# Patient Record
Sex: Male | Born: 1935 | Race: White | Hispanic: No | Marital: Married | Smoking: Former smoker
Health system: Southern US, Community
[De-identification: ages and names within clinical notes are randomized; demographics above are authoritative.]

## PROBLEM LIST (undated history)

## (undated) DIAGNOSIS — R1310 Dysphagia, unspecified: Secondary | ICD-10-CM

## (undated) DIAGNOSIS — E785 Hyperlipidemia, unspecified: Secondary | ICD-10-CM

## (undated) DIAGNOSIS — I10 Essential (primary) hypertension: Secondary | ICD-10-CM

## (undated) DIAGNOSIS — R600 Localized edema: Secondary | ICD-10-CM

## (undated) DIAGNOSIS — C61 Malignant neoplasm of prostate: Secondary | ICD-10-CM

## (undated) DIAGNOSIS — K219 Gastro-esophageal reflux disease without esophagitis: Secondary | ICD-10-CM

## (undated) DIAGNOSIS — K529 Noninfective gastroenteritis and colitis, unspecified: Secondary | ICD-10-CM

## (undated) DIAGNOSIS — J189 Pneumonia, unspecified organism: Secondary | ICD-10-CM

## (undated) DIAGNOSIS — J90 Pleural effusion, not elsewhere classified: Secondary | ICD-10-CM

## (undated) DIAGNOSIS — D649 Anemia, unspecified: Secondary | ICD-10-CM

## (undated) DIAGNOSIS — N289 Disorder of kidney and ureter, unspecified: Secondary | ICD-10-CM

## (undated) DIAGNOSIS — N189 Chronic kidney disease, unspecified: Secondary | ICD-10-CM

## (undated) DIAGNOSIS — R3129 Other microscopic hematuria: Secondary | ICD-10-CM

## (undated) DIAGNOSIS — Z789 Other specified health status: Secondary | ICD-10-CM

## (undated) DIAGNOSIS — R55 Syncope and collapse: Secondary | ICD-10-CM

## (undated) DIAGNOSIS — K449 Diaphragmatic hernia without obstruction or gangrene: Secondary | ICD-10-CM

## (undated) DIAGNOSIS — N529 Male erectile dysfunction, unspecified: Secondary | ICD-10-CM

## (undated) DIAGNOSIS — K8689 Other specified diseases of pancreas: Secondary | ICD-10-CM

## (undated) DIAGNOSIS — B029 Zoster without complications: Secondary | ICD-10-CM

## (undated) DIAGNOSIS — M810 Age-related osteoporosis without current pathological fracture: Secondary | ICD-10-CM

## (undated) DIAGNOSIS — B351 Tinea unguium: Secondary | ICD-10-CM

## (undated) DIAGNOSIS — K4090 Unilateral inguinal hernia, without obstruction or gangrene, not specified as recurrent: Secondary | ICD-10-CM

## (undated) HISTORY — DX: Diaphragmatic hernia without obstruction or gangrene: K44.9

## (undated) HISTORY — DX: Zoster without complications: B02.9

## (undated) HISTORY — DX: Syncope and collapse: R55

## (undated) HISTORY — DX: Other specified health status: Z78.9

## (undated) HISTORY — DX: Essential (primary) hypertension: I10

## (undated) HISTORY — DX: Unilateral inguinal hernia, without obstruction or gangrene, not specified as recurrent: K40.90

## (undated) HISTORY — DX: Pleural effusion, not elsewhere classified: J90

## (undated) HISTORY — DX: Dysphagia, unspecified: R13.10

## (undated) HISTORY — PX: HERNIA REPAIR: SHX51

## (undated) HISTORY — DX: Localized edema: R60.0

## (undated) HISTORY — DX: Anemia, unspecified: D64.9

## (undated) HISTORY — DX: Noninfective gastroenteritis and colitis, unspecified: K52.9

## (undated) HISTORY — DX: Chronic kidney disease, unspecified: N18.9

## (undated) HISTORY — DX: Other microscopic hematuria: R31.29

## (undated) HISTORY — PX: OTHER SURGICAL HISTORY: SHX169

## (undated) HISTORY — DX: Tinea unguium: B35.1

## (undated) HISTORY — DX: Hyperlipidemia, unspecified: E78.5

## (undated) HISTORY — DX: Male erectile dysfunction, unspecified: N52.9

## (undated) HISTORY — DX: Malignant neoplasm of prostate: C61

## (undated) HISTORY — DX: Age-related osteoporosis without current pathological fracture: M81.0

## (undated) HISTORY — DX: Disorder of kidney and ureter, unspecified: N28.9

---

## 1999-10-08 HISTORY — PX: TRANSPERINEAL IMPLANT OF RADIATION SEEDS W/ ULTRASOUND: SUR1381

## 2001-09-23 ENCOUNTER — Ambulatory Visit: Admission: RE | Admit: 2001-09-23 | Discharge: 2001-12-22 | Payer: Self-pay | Admitting: Radiation Oncology

## 2002-01-07 ENCOUNTER — Ambulatory Visit: Admission: RE | Admit: 2002-01-07 | Discharge: 2002-04-07 | Payer: Self-pay | Admitting: Radiation Oncology

## 2002-02-16 ENCOUNTER — Encounter: Payer: Self-pay | Admitting: Urology

## 2002-02-16 ENCOUNTER — Encounter: Admission: RE | Admit: 2002-02-16 | Discharge: 2002-02-16 | Payer: Self-pay | Admitting: Urology

## 2002-03-16 ENCOUNTER — Ambulatory Visit (HOSPITAL_BASED_OUTPATIENT_CLINIC_OR_DEPARTMENT_OTHER): Admission: RE | Admit: 2002-03-16 | Discharge: 2002-03-16 | Payer: Self-pay | Admitting: Urology

## 2003-09-26 ENCOUNTER — Ambulatory Visit: Admission: RE | Admit: 2003-09-26 | Discharge: 2003-09-26 | Payer: Self-pay | Admitting: Radiation Oncology

## 2004-02-15 ENCOUNTER — Ambulatory Visit (HOSPITAL_COMMUNITY): Admission: RE | Admit: 2004-02-15 | Discharge: 2004-02-15 | Payer: Self-pay | Admitting: *Deleted

## 2004-02-15 ENCOUNTER — Encounter (INDEPENDENT_AMBULATORY_CARE_PROVIDER_SITE_OTHER): Payer: Self-pay | Admitting: *Deleted

## 2004-02-17 ENCOUNTER — Encounter: Admission: RE | Admit: 2004-02-17 | Discharge: 2004-02-17 | Payer: Self-pay | Admitting: *Deleted

## 2004-08-16 ENCOUNTER — Encounter (INDEPENDENT_AMBULATORY_CARE_PROVIDER_SITE_OTHER): Payer: Self-pay | Admitting: *Deleted

## 2004-08-16 ENCOUNTER — Ambulatory Visit (HOSPITAL_COMMUNITY): Admission: RE | Admit: 2004-08-16 | Discharge: 2004-08-16 | Payer: Self-pay | Admitting: *Deleted

## 2004-09-24 ENCOUNTER — Ambulatory Visit: Admission: RE | Admit: 2004-09-24 | Discharge: 2004-09-24 | Payer: Self-pay | Admitting: Radiation Oncology

## 2005-06-05 ENCOUNTER — Encounter: Admission: RE | Admit: 2005-06-05 | Discharge: 2005-06-05 | Payer: Self-pay | Admitting: *Deleted

## 2005-09-13 ENCOUNTER — Encounter: Admission: RE | Admit: 2005-09-13 | Discharge: 2005-09-13 | Payer: Self-pay | Admitting: Gastroenterology

## 2006-10-21 ENCOUNTER — Encounter: Admission: RE | Admit: 2006-10-21 | Discharge: 2006-10-21 | Payer: Self-pay | Admitting: Cardiology

## 2006-10-24 ENCOUNTER — Ambulatory Visit (HOSPITAL_COMMUNITY): Admission: RE | Admit: 2006-10-24 | Discharge: 2006-10-24 | Payer: Self-pay | Admitting: Cardiology

## 2006-12-03 ENCOUNTER — Encounter: Admission: RE | Admit: 2006-12-03 | Discharge: 2006-12-03 | Payer: Self-pay | Admitting: Cardiology

## 2006-12-05 ENCOUNTER — Encounter (INDEPENDENT_AMBULATORY_CARE_PROVIDER_SITE_OTHER): Payer: Self-pay | Admitting: Specialist

## 2006-12-05 ENCOUNTER — Ambulatory Visit (HOSPITAL_COMMUNITY): Admission: RE | Admit: 2006-12-05 | Discharge: 2006-12-05 | Payer: Self-pay | Admitting: Cardiology

## 2006-12-19 ENCOUNTER — Ambulatory Visit (HOSPITAL_COMMUNITY): Admission: RE | Admit: 2006-12-19 | Discharge: 2006-12-19 | Payer: Self-pay | Admitting: Emergency Medicine

## 2006-12-19 ENCOUNTER — Ambulatory Visit: Payer: Self-pay | Admitting: Emergency Medicine

## 2006-12-23 ENCOUNTER — Ambulatory Visit: Payer: Self-pay | Admitting: Cardiology

## 2006-12-23 ENCOUNTER — Ambulatory Visit: Payer: Self-pay | Admitting: Emergency Medicine

## 2006-12-23 LAB — CONVERTED CEMR LAB
BUN: 17 mg/dL (ref 6–23)
CO2: 33 meq/L — ABNORMAL HIGH (ref 19–32)
Calcium: 9.6 mg/dL (ref 8.4–10.5)
Glucose, Bld: 106 mg/dL — ABNORMAL HIGH (ref 70–99)
Sodium: 138 meq/L (ref 135–145)
Total Protein: 6.7 g/dL (ref 6.0–8.3)

## 2007-01-02 ENCOUNTER — Ambulatory Visit: Payer: Self-pay | Admitting: Emergency Medicine

## 2007-01-30 ENCOUNTER — Ambulatory Visit: Payer: Self-pay | Admitting: Emergency Medicine

## 2007-01-30 LAB — CONVERTED CEMR LAB
BUN: 39 mg/dL — ABNORMAL HIGH (ref 6–23)
CO2: 32 meq/L (ref 19–32)
GFR calc Af Amer: 63 mL/min
Glucose, Bld: 102 mg/dL — ABNORMAL HIGH (ref 70–99)
Sodium: 143 meq/L (ref 135–145)

## 2007-02-04 ENCOUNTER — Ambulatory Visit: Payer: Self-pay

## 2007-02-11 ENCOUNTER — Ambulatory Visit: Payer: Self-pay | Admitting: Emergency Medicine

## 2007-06-23 DIAGNOSIS — C61 Malignant neoplasm of prostate: Secondary | ICD-10-CM | POA: Insufficient documentation

## 2007-06-23 DIAGNOSIS — Z85828 Personal history of other malignant neoplasm of skin: Secondary | ICD-10-CM

## 2007-06-23 DIAGNOSIS — J90 Pleural effusion, not elsewhere classified: Secondary | ICD-10-CM | POA: Insufficient documentation

## 2007-06-23 DIAGNOSIS — F3289 Other specified depressive episodes: Secondary | ICD-10-CM | POA: Insufficient documentation

## 2007-06-23 DIAGNOSIS — I10 Essential (primary) hypertension: Secondary | ICD-10-CM

## 2007-06-23 DIAGNOSIS — F329 Major depressive disorder, single episode, unspecified: Secondary | ICD-10-CM

## 2007-06-23 DIAGNOSIS — E785 Hyperlipidemia, unspecified: Secondary | ICD-10-CM

## 2007-06-23 DIAGNOSIS — M1990 Unspecified osteoarthritis, unspecified site: Secondary | ICD-10-CM | POA: Insufficient documentation

## 2007-09-11 ENCOUNTER — Encounter: Admission: RE | Admit: 2007-09-11 | Discharge: 2007-09-11 | Payer: Self-pay | Admitting: Gastroenterology

## 2009-04-14 ENCOUNTER — Ambulatory Visit (HOSPITAL_COMMUNITY): Admission: RE | Admit: 2009-04-14 | Discharge: 2009-04-14 | Payer: Self-pay | Admitting: Orthopedic Surgery

## 2009-05-01 ENCOUNTER — Ambulatory Visit (HOSPITAL_COMMUNITY): Admission: RE | Admit: 2009-05-01 | Discharge: 2009-05-01 | Payer: Self-pay | Admitting: Urology

## 2009-11-20 ENCOUNTER — Encounter (HOSPITAL_COMMUNITY): Admission: RE | Admit: 2009-11-20 | Discharge: 2010-01-15 | Payer: Self-pay | Admitting: Endocrinology

## 2010-03-05 ENCOUNTER — Emergency Department (HOSPITAL_COMMUNITY): Admission: EM | Admit: 2010-03-05 | Discharge: 2010-03-05 | Payer: Self-pay | Admitting: Family Medicine

## 2010-10-07 DIAGNOSIS — R55 Syncope and collapse: Secondary | ICD-10-CM

## 2010-10-07 HISTORY — DX: Syncope and collapse: R55

## 2010-10-28 ENCOUNTER — Encounter: Payer: Self-pay | Admitting: Cardiology

## 2011-02-03 ENCOUNTER — Emergency Department (HOSPITAL_COMMUNITY): Payer: Medicare Other

## 2011-02-03 ENCOUNTER — Inpatient Hospital Stay (HOSPITAL_COMMUNITY)
Admission: EM | Admit: 2011-02-03 | Discharge: 2011-02-06 | DRG: 312 | Disposition: A | Discharge status: Home / Self Care | Payer: Medicare Other | Attending: Family Medicine | Admitting: Family Medicine

## 2011-02-03 DIAGNOSIS — G8929 Other chronic pain: Secondary | ICD-10-CM | POA: Diagnosis present

## 2011-02-03 DIAGNOSIS — E871 Hypo-osmolality and hyponatremia: Secondary | ICD-10-CM | POA: Diagnosis present

## 2011-02-03 DIAGNOSIS — R1310 Dysphagia, unspecified: Secondary | ICD-10-CM | POA: Diagnosis present

## 2011-02-03 DIAGNOSIS — R112 Nausea with vomiting, unspecified: Secondary | ICD-10-CM | POA: Diagnosis present

## 2011-02-03 DIAGNOSIS — Y998 Other external cause status: Secondary | ICD-10-CM

## 2011-02-03 DIAGNOSIS — Z8546 Personal history of malignant neoplasm of prostate: Secondary | ICD-10-CM

## 2011-02-03 DIAGNOSIS — M810 Age-related osteoporosis without current pathological fracture: Secondary | ICD-10-CM | POA: Diagnosis present

## 2011-02-03 DIAGNOSIS — M6282 Rhabdomyolysis: Secondary | ICD-10-CM | POA: Diagnosis present

## 2011-02-03 DIAGNOSIS — M549 Dorsalgia, unspecified: Secondary | ICD-10-CM | POA: Diagnosis present

## 2011-02-03 DIAGNOSIS — R296 Repeated falls: Secondary | ICD-10-CM | POA: Diagnosis present

## 2011-02-03 DIAGNOSIS — M25519 Pain in unspecified shoulder: Secondary | ICD-10-CM | POA: Diagnosis present

## 2011-02-03 DIAGNOSIS — R55 Syncope and collapse: Principal | ICD-10-CM | POA: Diagnosis present

## 2011-02-03 LAB — BASIC METABOLIC PANEL
BUN: 18 mg/dL (ref 6–23)
CO2: 25 mEq/L (ref 19–32)
Calcium: 8.9 mg/dL (ref 8.4–10.5)
Chloride: 94 mEq/L — ABNORMAL LOW (ref 96–112)
Creatinine, Ser: 1.38 mg/dL (ref 0.4–1.5)
GFR calc non Af Amer: 50 mL/min — ABNORMAL LOW (ref >60)
Potassium: 3.6 mEq/L (ref 3.5–5.1)
Sodium: 128 mEq/L — ABNORMAL LOW (ref 135–145)

## 2011-02-03 LAB — POCT CARDIAC MARKERS
Myoglobin, poc: >500 ng/mL (ref 12–200)
Troponin i, poc: <0.05 ng/mL (ref 0.00–0.09)

## 2011-02-03 LAB — CBC
Hemoglobin: 11.3 g/dL — ABNORMAL LOW (ref 13.0–17.0)
MCH: 32.8 pg (ref 26.0–34.0)
MCHC: 35 g/dL (ref 30.0–36.0)

## 2011-02-03 LAB — DIFFERENTIAL
Eosinophils Relative: 1 % (ref 0–5)
Lymphocytes Relative: 22 % (ref 12–46)
Lymphs Abs: 1.8 10*3/uL (ref 0.7–4.0)
Monocytes Absolute: 0.5 10*3/uL (ref 0.1–1.0)
Monocytes Relative: 6 % (ref 3–12)
Neutro Abs: 6 10*3/uL (ref 1.7–7.7)

## 2011-02-03 LAB — CK TOTAL AND CKMB (NOT AT ARMC)
CK, MB: 18.5 ng/mL (ref 0.3–4.0)
Relative Index: 4.4 — ABNORMAL HIGH (ref 0.0–2.5)

## 2011-02-03 LAB — HEPATIC FUNCTION PANEL
ALT: 20 U/L (ref 0–53)
Albumin: 3.8 g/dL (ref 3.5–5.2)
Total Protein: 6.8 g/dL (ref 6.0–8.3)

## 2011-02-04 ENCOUNTER — Observation Stay (HOSPITAL_COMMUNITY): Payer: Medicare Other

## 2011-02-04 LAB — COMPREHENSIVE METABOLIC PANEL
ALT: 18 U/L (ref 0–53)
AST: 35 U/L (ref 0–37)
Alkaline Phosphatase: 51 U/L (ref 39–117)
CO2: 26 mEq/L (ref 19–32)
Calcium: 8.4 mg/dL (ref 8.4–10.5)
Chloride: 98 mEq/L (ref 96–112)
GFR calc Af Amer: 57 mL/min — ABNORMAL LOW (ref >60)
GFR calc non Af Amer: 47 mL/min — ABNORMAL LOW (ref >60)
Glucose, Bld: 115 mg/dL — ABNORMAL HIGH (ref 70–99)
Potassium: 3.9 mEq/L (ref 3.5–5.1)
Sodium: 132 mEq/L — ABNORMAL LOW (ref 135–145)
Total Bilirubin: 0.6 mg/dL (ref 0.3–1.2)

## 2011-02-04 LAB — CARDIAC PANEL(CRET KIN+CKTOT+MB+TROPI)
CK, MB: 8.5 ng/mL (ref 0.3–4.0)
Relative Index: 2.8 — ABNORMAL HIGH (ref 0.0–2.5)
Relative Index: 2.3 (ref 0.0–2.5)
Total CK: 368 U/L — ABNORMAL HIGH (ref 7–232)

## 2011-02-04 LAB — CBC
HCT: 29.4 % — ABNORMAL LOW (ref 39.0–52.0)
Hemoglobin: 10 g/dL — ABNORMAL LOW (ref 13.0–17.0)
MCV: 93.9 fL (ref 78.0–100.0)
WBC: 7.8 10*3/uL (ref 4.0–10.5)

## 2011-02-04 LAB — IRON AND TIBC
Iron: 42 ug/dL (ref 42–135)
Saturation Ratios: 15 % — ABNORMAL LOW (ref 20–55)
TIBC: 287 ug/dL (ref 215–435)

## 2011-02-04 LAB — SODIUM, URINE, RANDOM: Sodium, Ur: 30 mEq/L

## 2011-02-05 ENCOUNTER — Inpatient Hospital Stay (HOSPITAL_COMMUNITY): Payer: Medicare Other

## 2011-02-05 DIAGNOSIS — R55 Syncope and collapse: Secondary | ICD-10-CM

## 2011-02-05 LAB — CBC
HCT: 29.5 % — ABNORMAL LOW (ref 39.0–52.0)
Hemoglobin: 9.9 g/dL — ABNORMAL LOW (ref 13.0–17.0)
MCH: 32.2 pg (ref 26.0–34.0)
RBC: 3.07 MIL/uL — ABNORMAL LOW (ref 4.22–5.81)

## 2011-02-05 LAB — COMPREHENSIVE METABOLIC PANEL
Albumin: 3.2 g/dL — ABNORMAL LOW (ref 3.5–5.2)
BUN: 14 mg/dL (ref 6–23)
Calcium: 8.6 mg/dL (ref 8.4–10.5)
Chloride: 107 mEq/L (ref 96–112)
Creatinine, Ser: 1.42 mg/dL (ref 0.4–1.5)
GFR calc non Af Amer: 48 mL/min — ABNORMAL LOW (ref >60)
Total Bilirubin: 0.6 mg/dL (ref 0.3–1.2)

## 2011-02-05 LAB — D-DIMER, QUANTITATIVE: D-Dimer, Quant: 1.18 ug/mL-FEU — ABNORMAL HIGH (ref 0.00–0.48)

## 2011-02-05 MED ORDER — TECHNETIUM TO 99M ALBUMIN AGGREGATED
6.0000 | Freq: Once | INTRAVENOUS | Status: AC | PRN
  Administered 2011-02-05: 6 via INTRAVENOUS

## 2011-02-05 MED ORDER — XENON XE 133 GAS
10.0000 | GAS_FOR_INHALATION | Freq: Once | RESPIRATORY_TRACT | Status: AC | PRN
  Administered 2011-02-05: 10 via RESPIRATORY_TRACT

## 2011-02-06 ENCOUNTER — Inpatient Hospital Stay (HOSPITAL_COMMUNITY): Payer: Medicare Other

## 2011-02-06 LAB — CBC
HCT: 29.2 % — ABNORMAL LOW (ref 39.0–52.0)
Hemoglobin: 9.6 g/dL — ABNORMAL LOW (ref 13.0–17.0)
MCH: 31.7 pg (ref 26.0–34.0)
MCHC: 32.9 g/dL (ref 30.0–36.0)
MCV: 96.4 fL (ref 78.0–100.0)
Platelets: 141 10*3/uL — ABNORMAL LOW (ref 150–400)
RBC: 3.03 MIL/uL — ABNORMAL LOW (ref 4.22–5.81)
RDW: 12.8 % (ref 11.5–15.5)
WBC: 5.4 10*3/uL (ref 4.0–10.5)

## 2011-02-06 LAB — COMPREHENSIVE METABOLIC PANEL
Alkaline Phosphatase: 56 U/L (ref 39–117)
BUN: 14 mg/dL (ref 6–23)
Chloride: 107 mEq/L (ref 96–112)
GFR calc non Af Amer: 52 mL/min — ABNORMAL LOW (ref >60)
Glucose, Bld: 102 mg/dL — ABNORMAL HIGH (ref 70–99)
Potassium: 3.6 mEq/L (ref 3.5–5.1)
Total Bilirubin: 0.2 mg/dL — ABNORMAL LOW (ref 0.3–1.2)

## 2011-02-13 NOTE — Discharge Summary (Signed)
[name] [name], [name] [name]               ACCOUNT NO.:  [id number]  MEDICAL RECORD NO.:  [id number]           PATIENT TYPE:  I  LOCATION:  4704                         FACILITY:  [facility]  PHYSICIAN:  Calvert Cantor, M.D.     DATE OF BIRTH:  02/24/1929  DATE OF ADMISSION:  02/03/2011 DATE OF DISCHARGE:  02/06/2011                              DISCHARGE SUMMARY   PRIMARY CARE PHYSICIAN:  [name], MD  GASTROENTEROLOGIST:  [name], MD  CHIEF COMPLAINT:  Fall.  DISCHARGE DIAGNOSES: 1. Syncopal episode. 2. Hyponatremia. 3. Dysphagia to solids with resultant regurgitation and vomiting. 4. Left-sided neck pain due to trauma from syncopal episode and fall. 5. Mild rhabdomyolysis.  PAST MEDICAL HISTORY: 1. Hypertension. 2. Hyperlipidemia. 3. Osteoporosis. 4. Prostate cancer status post seed implantation. 5. Chronic pain.  DISCHARGE MEDICATIONS:  I have not made any changes to his medications. The patient can continue on the following. 1. Altace 5 mg daily. 2. Calcium carbonate 600 mg/vitamin D 1 tablet twice a day. 3. Crestor 10 mg daily. 4. HCTZ 25 mg daily. 5. Miacalcin 1 spray in alternate nostrils daily. 6. Multivitamin 1 tablet daily. 7. Oxycodone 30 mg every 8 hours as needed for pain. 8. Vesicare 10 mg daily at bedtime. 9. Zoloft 50 mg tab, take to the morning, 1 in the evening.  IMAGING: 1. CT C-spine without contrast revealed cervical spondylosis without     acute injury. 2. CT head without contrast, no acute findings. 3. Chest x-ray, stable appearance with left basilar scarring and old     right rib fractures. 4. Left shoulder x-ray, no acute findings, mild acromioclavicular     osteoarthritis. 5. Ribs, no acute finding, remote right rib fracture. 6. Scapula x-ray, no acute finding. 7. MRI of the brain without contrast, atrophy and mild chronic     microvascular ischemic, no acute infarct or mass, chronic     sinusitis. 8. Ultrasound of the abdomen  complete, which was ordered for his     vomiting revealed no evidence of gallstones or acute cholecystitis.     There was possible mild hepatic steatosis and increased renal     echogenicity suggesting medical renal disease. 9. V/Q scan revealed attenuation from overlying left arm on the left     lateral image.  No segmental or lobar ventilation or perfusion     defects seen.  No ventilation perfusion mismatch.  Exam is low     probability for PE. 10.Barium swallow for complaint of dysphagia revealed small sliding     hiatal hernia without demonstration of reflux or stricture.  There     was marked dysmotility with tertiary contractions. 11.A 2-D echo revealed EF of 55-60%.  There was grade 1 diastolic     dysfunction, mild diffuse thickening, and calcification of the     aortic valve with trivial regurgitation.  Left atrium was mildly     dilated. 12.Carotid Doppler preliminary study was negative.  LABORATORY DATA: 1. Pertinent blood work; sodium on admission 128, improved to 140 on     discharge with IV fluids and holding HCTZ. 2. Hemoglobin 9.6,  hematocrit 29.2 on discharge, platelet count 141. 3. Iron levels revealed UIBC, which was slightly low at 15, range     being 20-55.  Iron level was at the low side at 42, range being 42-     135.  Total iron binding capacity 287, ferritin 139.  Vitamin B12     and folic acid levels were within normal limits. 4. Urine osmolality checked on admission 211, urine sodium 30. 5. Mild rhabdomyolysis with total CK in the 300-400.  HOSPITAL COURSE:  This is an 77 year old male, who came in after a fall. The patient fell backwards and hit his head on the floor.  He did lose consciousness, therefore he was admitted with a diagnosis of syncope. He apparently had some jerky movements, but did not have tongue biting or incontinence of urine or stool.  The patient admitted to having multiple episodes of vomiting the prior day.  After waking up from  his spell of syncope, he noticed severe pain in his neck and shoulder.  The patient was admitted to the telemetry floor and above-mentioned workup was done for his syncope.  No obvious cause was found.  He was noted to be slightly hyponatremic.  I am unsure of his syncope was orthostatic secondary to dehydration, but on admission his HCTZ was held and he was hydrated with normal saline.  No abnormalities were noted on telemetry monitor.  The patient was noted to have ecchymosis on the left side of his neck.  He does complain of continued stiffness of his neck and shoulder secondary to trauma from the fall.  Pain is not severe.  The patient presented with a complaint of vomiting the day prior to admission.  Upon obtaining further details, the patient expressed that he was feeling like his food was getting stuck in his chest.  This would result in gagging and then subsequently vomiting.  Therefore, a barium swallow was ordered and the above-mentioned results were noted to esophageal dysmotility.  The patient's gastroenterologist is [name], however, he was in procedures today and I was unable to speak with him.  I did speak with [name], who advised that there is no specific treatment for this problem.  Essentially, the patient needs to take small bites and chew his food carefully and thoroughly to help prevent it from lodging in the esophagus.  If the patient has further questions or further problems, he is advised that he can see [name] in the office.  The patient and family are in agreement with this plan. The patient has been ambulating in the halls and does not complain of any dizziness or other symptoms of near syncope.  PHYSICAL EXAM:  LUNGS:  Clear bilaterally. HEART:  Regular rate and rhythm.  No murmurs. ABDOMEN:  Soft, nontender, nondistended.  Bowel sounds positive.  No organomegaly. EXTREMITIES:  No cyanosis, clubbing, or edema.  CONDITION ON DISCHARGE:   Stable.  DISCHARGE INSTRUCTIONS: 1. Follow up with [name] in 1-2 weeks.  I am uncertain of     whether his sodium of 128 is something that was chronic.  Also I am     not completely certain if this hyponatremia or dehydration was the     cause of his syncope, but he is advised to have his sodium checked     in a couple of weeks. 2. Follow up with [name] if needed.  Time on discharge was 50 minutes.     Calvert Cantor, M.D.  SR/MEDQ  D:  02/06/2011  T:  02/06/2011  Job:  [id number]  cc:   [name], M.D. [name], M.D.  Electronically Signed by Calvert Cantor M.D. on 02/13/2011 11:18:35 PM

## 2011-02-17 NOTE — H&P (Signed)
[name] [name], [name] [name]               ACCOUNT NO.:  [id number]  MEDICAL RECORD NO.:  [id number]           PATIENT TYPE:  E  LOCATION:  [facility]                         FACILITY:  [facility]  PHYSICIAN:  Eduard Clos, MDDATE OF BIRTH:  04-22-1929  DATE OF ADMISSION:  02/03/2011 DATE OF DISCHARGE:                             HISTORY & PHYSICAL   CHIEF COMPLAINT:  Fall.  HISTORY OF PRESENT ILLNESS:  This is an 77 year old male with known history of hypertension, osteoporosis, hyperlipidemia, CA prostate status post seed placement, was doing fine and while talking to his wife in the kitchen around noontime suddenly, he fell backwards and hit on the floor.  He has lost the consciousness for a few minutes.  At that time, he also had some jerking movements.  He did not have any tongue biting, incontinent of urine.  He woke up spontaneously back and was brought to the ER.  He did not have any chest pain or shortness of breath.  Denies any cough or phlegm.  Denies any fever or chills.  He has no abdominal pain, dysuria, discharges or diarrhea.  The patient prior after the fall did not have any focal deficit, headache or visual symptoms.  The patient does complain of some neck pain and left shoulder pain.  The pain is increased with movement of his shoulder.  There is also tenderness on the left upper back area.  The patient said he has been having multiple episodes of nausea and vomiting last night and he was feeling weak after that.  Today morning was feeling fine.  He did not have any nausea or vomiting.  At this time, the patient had a CT head, neck, EKGs, cardiac enzyme all within negative.  The patient will be admitted for further workup.  PAST MEDICAL HISTORY:  Hypertension, hyperlipidemia, osteoporosis, history of CA prostate, chronic pain.  PAST SURGICAL HISTORY:  Inguinal hernia surgery and has had seed placement for prostate cancer.  MEDICATIONS PRIOR TO ADMISSION: 1. Miacalcin  200 units daily. 2. Zoloft 50 mg 3 tablets daily. 3. Crestor 10 mg daily. 4. Altace 5 mg daily. 5. Hydrochlorothiazide 25 mg daily. 6. Caltrate 600 mg twice daily. 7. Centrum Silver one daily. 8. Fish oil 1200 mg daily. 9. Magnesium 400 mg twice daily.  ALLERGIES.:  PENICILLIN.  SOCIAL HISTORY:  The patient does not smoke cigarette, drink alcohol, or use any illicit drugs.  Lives with his wife.  He is a full Code.  FAMILY HISTORY:  Nothing contributory.  REVIEW OF SYSTEMS:  As per history of present illness, nothing else significant.  PHYSICAL EXAMINATION:  GENERAL:  The patient examined at bedside, not in acute distress. VITAL SIGNS:  Blood pressure 113/60, pulse 85 per minute, temperature 98, respirations 18, O2 sat 95%.  HEENT: Anicteric.  No pallor.  No facial asymmetry.  PERRLA positive.  No discharge from ears, eyes or mouth.  There is mild tenderness in the left shoulder area noted in the neck.  No neck rigidity. CHEST:  Bilateral entry present.  No rhonchi. No crepitation. HEART:  S1, S2 heard. ABDOMEN: Soft, nontender.  Bowel sounds heard.  CNS:  The patient is alert, awake, oriented to time, place, and person. Moves upper and lower extremities 5/5.  There is no pronator drift. There is no dysdiadochokinesia.  There is no ataxia. EXTREMITIES:  Peripheral pulses felt.  As I said earlier, the patient has some pain in the left shoulder when he abducts beyond 45 degrees.  I do not see any obvious hematoma or bruises, peripheral pulses felt.  No edema.  No cyanotic changes or clubbing.  LABORATORY DATA:  EKG shows normal sinus rhythm with some nonspecific ST- T changes.  Heart rate is around 82 beats per minute.  CT head without contrast shows no acute findings.  CT cervical spine, cervical spondylosis without acute injury.  CBC:  WBC 8.5, hemoglobin is 11.3, hematocrit is 32.3, platelets 169.  Basic metabolic panel:  Sodium 128, potassium 3.6, chloride 94, carbon  dioxide 25, glucose 105, BUN 18, creatinine 1.3, calcium 8.9.  CK-MB is 12.2, troponin less than 0.05, myoglobin is more than 500.  ASSESSMENT: 1. Syncope. 2. Nausea and vomiting. 3. Mild hyponatremia. 4. Left shoulder and back pain increased on inspiration. 5. History of hypertension. 6. History of hyperlipidemia. 7. History of carcinoma of the prostate. 8. History of osteoporosis. 9. History of anemia.  PLAN: 1. At this time, we will admit the patient to telemetry to rule out     any arrhythmia. 2. For syncope at this time, we could not check orthostatic.  The     patient is mildly hyponatremic could be from his nausea, vomiting     yesterday and added on with hydrochlorothiazide.  We will gently     hydrate the patient at this time.  We will hold off his     hydrochlorothiazide at this time also, I am going to hold off his     Crestor as the myoglobin is high, we need to make sure there is no     rhabdomyolysis forming, for which we are going to check CK.  We are     going to cycle the cardiac markers.  We will check 2-D echo and MRI     of the brain. 3. Left shoulder pain and upper back pain, which is pleuritic and also     increased on the left shoulder abduction.  At this time, we are     going to get an x-ray of the left shoulder, scapula and also going     to do chest x-ray stat, the patient will continue with medication. 4. The patient does have anemia.  We will check anemia profile.     Recheck CBC in morning. 5. Hypertension.  We will continue Altace but we will hold of his     hydrochlorothiazide as he has hyponatremia.  We will check urine     sodium. 6. Further tests based on the test order and clinic course.     Eduard Clos, MD     ANK/MEDQ  D:  02/03/2011  T:  02/03/2011  Job:  [id number]  Electronically Signed by Midge Minium MD on 02/17/2011 08:28:31 AM

## 2011-02-22 NOTE — Op Note (Signed)
[facility]  Patient:    [name] [name], [name] [name] Visit Number: [id number] MRN: [id number]          Service Type: NES Location: [facility] Attending Physician:  Monica Becton Dictated by:   Claudette Laws, M.D. Proc. Date: 03/16/02 Admit Date:  03/16/2002                             Operative Report  PREOPERATIVE DIAGNOSIS:  Carcinoma of the prostate gland.  POSTOPERATIVE DIAGNOSIS:  Carcinoma of the prostate gland.  OPERATION:  Transperineal radioactive seed implantation and cystoscopy.  SURGEON:  Claudette Laws, M.D.  DESCRIPTION OF PROCEDURE:  The patient was prepped and draped in the dorsolithotomy position under intubated general anesthesia.  A transrectal probe was placed and imaging studies were recreated on the screen according to the preplanning study.  This patient did receive 4500 cGy ERT preoperative. Reference point was two point centimeters.  A total of 20 needles were placed according to the planning study.  At the end of the procedure, there appeared to be one pellet in the bladder.  Cystoscopy confirmed a portion of the strand in the bladder at about the three oclock position.  We were able to extract the strand.  It broke off along with the radioactive seed.  However, no other seeds were noted.  We then did a fluoroscopy post seed implant and there appeared to be two seeds in the bladder.  However, cystoscopy did not reveal any obvious seeds, so we put a Foley catheter and irrigated it out until clear.  The urine was slightly bloody.  We anticipate now that the seeds are in the bladder and they will migrate out themselves and he will void them spontaneously.  The patient was then taken back to the recovery room in satisfactory condition after placing a 20-French 10 cc Foley catheter.  We used palladium seeds strand. Dictated by:   Claudette Laws, M.D. Attending Physician:  Monica Becton DD:  03/16/02 TD:  03/17/02 Job:  [id number]

## 2011-02-22 NOTE — Assessment & Plan Note (Signed)
[facility]                             PULMONARY OFFICE NOTE   NAME:[name] [name], [name] [name]                      MRN:          [id number]  DATE:02/11/2007                            DOB:          May 12, 1929    SUBJECTIVE:  [name] [name] is a 77 year old gentleman who returns today for  a follow up visit. I have seen him for a left sided exudative effusion  that was believed to be due to a viral pleurisy. At our last visit, I  was concerned that [name] [name] was experiencing some worsening lower  extremity edema. He had been on ibuprofen for his pleuritic pain and I  was concerned that this, coupled with his ACE inhibitor use, might have  caused some renal insufficiency. His renal panel has been performed and  it is detailed below. He tells me that his breathing is doing well. He  is not having any chest discomfort. His chest x-ray from 01/30/2007 shows  that his left sided effusion is smaller.   CURRENT MEDICATIONS:  This were reviewed and are correct on his clinic  chart.   PHYSICAL EXAMINATION:  GENERAL:  This is a pleasant elderly man who  interacts appropriately and is in no distress.  VITAL SIGNS:  Weight 171 pounds, temperature 98.4, blood pressure  150/80, heart rate 72, SPO2 97% on room air.  HEENT:  Benign.  NECK:  Without strider.  LUNGS:  Clear on the right. He does have some slightly decreased breath  sounds at his left base, but the air movement is much improved compared  with his prior exams. I do not hear any wheezing.  HEART:  Regular rate and rhythm without murmur.  ABDOMEN:  Benign.  EXTREMITIES:  No cyanosis or clubbing. He does have some mild ankle  edema. A Doppler ultrasound of his bilateral lower extremities is  negative for DVT.   LABORATORY DATA:  His chemistry panel shows a sodium of 143, potassium  5.0, chloride 106, CO2 32, BUN 39, creatinine 1.4, glucose 102.   IMPRESSION:  1. Pleurisy with left sided effusion that is now  resolved without any      evidence of dyspnea.  2. Lower extremity edema without evidence for DVT or new renal      insufficiency.   PLANS:  1. I do not believe that there is any need for any further evaluation      of [name] [name]' effusion unless he begins to evolve symptoms      including chest discomfort or shortness of breath, etcetera.  2. I will defer to [name] and to Dr. Donnie Aho regarding Mr.      [name]' lower extremity edema. I have asked him to discontinue his      ibuprofen, since his pleuritic chest discomfort has      improved.  3. [name] [name] can see me on as needed basis.     Leslye Peer, MD  Electronically Signed    RSB/MedQ  DD: 02/22/2007  DT: 02/23/2007  Job #: [id number]   cc:   [name], M.D.  Lacretia Nicks.  Viann Fish, M.D.

## 2011-02-22 NOTE — Cardiovascular Report (Signed)
[name] [name], [name] [name]               ACCOUNT NO.:  [id number]   MEDICAL RECORD NO.:  [id number]          PATIENT TYPE:  OIB   LOCATION:  2899                         FACILITY:  [facility]   PHYSICIAN:  Georga Hacking, M.D.DATE OF BIRTH:  14-Sep-1929   DATE OF PROCEDURE:  10/24/2006  DATE OF DISCHARGE:                            CARDIAC CATHETERIZATION   HISTORY:  A 77 year old male with risk factors of family history,  hypertension, and hyperlipidemia who has had anterior chest discomfort  throughout the week.  He was initially evaluated with a chest CT and had  negative cardiac enzymes.  He has continued to have chest discomfort at  rest and is brought into the Catheterization Laboratory.  He has had new  inferior T-wave changes noted.   PROCEDURE:  Left heart catheterization with coronary angiograms and left  ventriculogram.   COMMENTS ABOUT PROCEDURE:  The patient tolerated the procedure well  without complications.  Following the procedure, he had good hemostasis  and peripheral pulses noted.  The procedure was done through the right  femoral artery with a single anterior needle wall stick with a 6-French  catheter-based system.   HEMODYNAMIC DATA:  Aorta post contrast 107/50.  LV post contrast 107/3-  14.   ANGIOGRAPHIC DATA:  Left ventriculogram:  Performed in the 30-degree RAO  projection.  Aortic valve is normal.  The mitral valve is normal.  The  left ventricle appears normal in size.  Estimated ejection fraction is  60%.  Coronary arteries arise and distribute normally.  No coronary  calcification is present.  The left main coronary artery is normal.  The  left anterior descending appears to be intramyocardial in its proximal  portion.  It contains no significant disease.  The circumflex coronary  artery has two marginal branches that is moderately tortuous and  contains no significant disease.  The right coronary artery has  significant tortuosity; this supplies a  posterior descending and a  posterolateral branch.  There are a set of two smaller posterolateral  branches that the distal vessel terminates in.   IMPRESSION:  1. Normal left ventricular function.  2. No significant coronary disease identified.   RECOMMENDATIONS:  Look for other sources of chest pain.  Continue  treatment of other risk factors.      Georga Hacking, M.D.  Electronically Signed     WST/MEDQ  D:  10/24/2006  T:  10/24/2006  Job:  [id number]   cc:   [name], M.D.

## 2011-02-22 NOTE — Assessment & Plan Note (Signed)
[facility]                             PULMONARY OFFICE NOTE   NAME:COMBSErman, Thum                      MRN:          [id number]  DATE:12/19/2006                            DOB:          11/16/1928    REASON FOR CONSULTATION:  We were asked by Dr. Viann Fish to  evaluate Mr. Klas for a left pleural effusion.   BRIEF HISTORY:  Mr. Marchena is a 77 year old man with a history of  prostate cancer and hypertension who was diagnosed with herpes zoster in  early February of this year around his left flank and costal margin.  He  states that he also fell and hurt his right side around the same time.  He went to be evaluated after that trauma, and his chest x-ray showed  left-sided pleural fluid.  Part of his evaluation has also included  referral to Dr. Donnie Aho to rule out occult coronary disease given his  chest discomfort at the time of his eruption of shingles, and some  nonspecific EKG abnormalities.  He had a cardiac catheterization that  did not show any significant coronary artery disease, and he did not  have any interventions made.  After his chest x-ray revealed left  pleural fluid, a thoracentesis was performed on December 05, 2006.  He  tells me that his breathing has been okay since then, but he has had a  decreased activity level over this time period.  He is referred now for  further evaluation of his left-sided effusion.   PAST MEDICAL HISTORY:  1. Hypertension.  2. Prostate cancer.  3. Skin cancer.  4. Osteoarthritis.  5. Hyperlipidemia.  6. Depression.   ALLERGIES:  PENICILLIN.   MEDICATIONS:  1. Fosamax 70 mg weekly.  2. Miacalcin 200 international units daily.  3. Zoloft 150 mg daily.  4. Crestor 10 mg daily.  5. Altace 5 mg daily.  6. Hydrochlorothiazide 25 mg daily.  7. Oxycodone 40 mg b.i.d.  8. Caltrate plus vitamin D 1200 mg daily.  9. Centrum Silver multivitamin once daily.  10.Fish oil 1200 mg daily.  11.Hydrocodone 10/500 one q. 4 hours p.r.n.   SOCIAL HISTORY:  Patient is married.  He has a remote tobacco history  with a total of 6-pack years.  He quit in 1954.  He occasionally drinks  wine.  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  He lives with his wife.   FAMILY HISTORY:  Significant for coronary artery disease in his brother,  lung cancer and prostate cancer in his brothers, throat cancer in his  mother.   REVIEW OF SYSTEMS:  Significant for exertional dyspnea, chest  discomfort, some symptoms of reflux and indigestion, and some difficulty  with swallowing.   EXAMINATION:  GENERAL:  This is a thin, elderly, well-appearing  gentleman, who is in no distress.  His weight is 169 pounds.  Temperature 98.5.  Blood pressure 114/62.  Heart rate 78.  SP02 94% on room air.  HEENT:  He has hearing aids bilaterally.  His oropharynx is clear.  NECK:  His neck is supple without lymphadenopathy or stridor.  LUNGS:  His lungs have decreased breath sounds at his left base.  There  is no wheezing or crackles heard.  HEART:  Has a regular rate and rhythm without murmur.  ABDOMEN:  Soft and benign.  EXTREMITIES:  Have no cyanosis, clubbing or edema.  NEUROLOGIC:  He has a nonfocal exam.  A CT scan of his chest performed on December 03, 2006 showed an  enlarging left pleural effusion that occupied approximately 50% of the  left hemithorax.  There was no definite associated pleural nodularity or  pleural enhancement on this contrasted study.  He had an old T6  compression fracture and an old right 10th rib fracture.  Otherwise, no  parenchymal abnormalities were noted.  A thoracentesis performed on  December 05, 2006 revealed pleural fluid that was sent for cytology.  This was consistent with flora reactive mesothelial cells on the  background of inflammation, but there were no malignant cells noted.  His total protein was 4.8.  His LDH was 120.  Serum studies were not  performed.  Cell count  differential showed total white blood cell count  of 600, twenty five percent PMNs, 26% lymphocytes, and 43% monocytes,  and 6% eosinophils.  Bacterial culture on the fluid was negative.  Followup chest x-ray on December 05, 2006 showed a decrease in the size  of his left-sided pleural effusion post thoracentesis, and no evidence  of pneumothorax.  Chest x-ray in the office today shows significant  reaccumulation of his left-sided pleural effusion that appears to layer  without evidence of loculation.   IMPRESSION:  Left pleural effusion of unclear etiology.  His pathology  and micro studies are negative to date.  His LDH and total protein could  be consistent with an exudate of monocyte predominance, but serum  studies were not performed.  I question whether this may be related to  his trauma versus possibly his herpes zoster, although I doubt the  latter.  His Cardiology evaluation has been reassuring.   PLAN:  I would like to resample his pleural fluid at this time, and send  a complete set of labs to help narrow our differential diagnosis.  In  particular, we need to know if this is truly an exudate or possibly  transudate.  He will follow up with me to review the results.   PROCEDURE:  Left thoracentesis was attempted under sterile conditions  after informed consent was obtained from Mr. Winquist.  Two passes were  made without any fluid obtained.  For this reason, the procedure was  aborted, and the decision was made to proceed with an ultrasound-guided  thoracentesis to allow adequate drainage.  A post-procedural chest x-ray  showed no evidence of pneumothorax after the procedure.     Leslye Peer, MD  Electronically Signed    RSB/MedQ  DD: 12/23/2006  DT: 12/23/2006  Job #: [id number]   cc:   Georga Hacking, M.D.

## 2011-02-22 NOTE — Assessment & Plan Note (Signed)
[facility]                             PULMONARY OFFICE NOTE   NAME:Cropper, HARITH MCCADDEN                      MRN:          [id number]  DATE:12/23/2006                            DOB:          25-Sep-1929    SUBJECTIVE:  Mr. Lakeman is a 77 year old man who returns today after his  ultrasound-guided thoracentesis to discuss the possible etiology of his  left pleural effusion.  He tells me today that his breathing is about  the same as it has been, it is no better, no worse.  He is not having  any cough or sputum production.  He denies any fevers or chills, but at  least once this week he has had a night sweat that soaked sheets.  He is  not having any significant chest pain.  His back is sore at the site of  his thoracentesis.   CURRENT MEDICATIONS:  Were reviewed and were the same as his visit on  December 19, 2006.   EXAM:  GENERAL:  This is a pleasant, thin, well-appearing, elderly  gentleman who is in no distress.  His weight is 169 pounds, temperature  98.1, blood pressure 112/64, heart rate 78, SPO2 96% on room air.  HEENT:  Benign.  NECK:  Supple without stridor or lymphadenopathy.  LUNGS:  Have improved aeration at his left base with no significant  crackles or wheezing, his right side is clear.  HEART:  Regular rate and rhythm without murmur.  ABDOMEN:  Soft, nontender, nondistended, positive bowel sounds.  EXTREMITIES:  No cyanosis, clubbing or edema.  NEUROLOGIC:  Nonfocal exam.   STUDIES:  Pleural fluid is available for evaluation.  His total protein  was 4.4, glucose 115, triglycerides 29, amylase 26, LDH 125.  The pH was  7.  Cell count differential showed 660 total white blood cells with 53%  neutrophils, 26% lymphocytes and 17% monocytes.  There were 4%  eosinophils.  Microbiology showed no organisms on Gram stain with final  culture still pending.  The fluid was negative for AFB on smear, and  culture is still pending.  Yeast was  negative on smear with culture  pending.   IMPRESSION:  Left pleural effusion that appears to be exudative in  nature without clear etiology.  He is currently clinically stable and  doing well.  His only constitutional symptom is one episode of night  sweats.   PLANS:  I would like to perform a CT scan of the chest with contrast to  better characterize Mr. Staib' underlying left lower lobe parenchyma now  that his pleural fluid has been removed.  I feel that this will allow [place]  to rule out any possible hidden mass that may explain his exudative  effusion.  It is quite possible that this fusion is related to  inflammation either from recent infection, his trauma or possibly even  the shingles, although this would be quite unusual.  I will follow up  with Mr. Chasen after his CT scan of the chest is performed to review  results and to  plan any necessary therapy.  Will also need to follow him clinically  with chest x-rays to ensure that his pleural fluid is not  reaccumulating.     Leslye Peer, MD  Electronically Signed    RSB/MedQ  DD: 12/23/2006  DT: 12/23/2006  Job #: [id number]   cc:   Georga Hacking, M.D.

## 2011-02-22 NOTE — Assessment & Plan Note (Signed)
[facility]                             PULMONARY OFFICE NOTE   NAME:[name], [name] COLAIZZI                      MRN:          [id number]  DATE:01/30/2007                            DOB:          Jun 14, 1929    SUBJECTIVE:  [name] [name] is a 77 year old man, who follows up today for  his left-sided pleural effusion.  The pleural fluid was exudative and  culture negative.  Followup CT scanning has not identified any  underlying mass or other clear cause for his exudative effusion.  He  returns today, telling me that he believes his breathing has improved  since our last visit.  He is also having no more chest discomfort, but  he has been taking ibuprofen 600 mg q. 6 hours around the clock.  Our  initial discussion had been for him to take this medication on an as-  needed basis.  Also since our last visit, he has noticed some left lower  extremity edema.  This is painless.  He noticed it about two weeks ago.  His right side is, for the most part, unaffected.   MEDICATIONS:  1. Fosamax 70 mg weekly.  2. Miacalcin 200 international units daily.  3. Zoloft 150 mg daily.  4. Crestor 10 mg daily.  5. Altace 5 mg daily.  6. HCTZ 25 mg daily.  7. Oxycodone 40 mg b.i.d.  8. Caltrate plus vitamin D 600 mg two daily.  9. Centrum Silver multivitamin once daily.  10.Fish oil 1200 mg daily.  11.Hydrocodone 10/500 q. 4 hours p.r.n.  12.Ibuprofen 600 mg q. 6 hours.   EXAM:  IN GENERAL:  This is an elderly, pleasant man, who is in no  distress.  His weight is 173 pounds, temperature 98.8, blood pressure  124/70, heart rate 79, spO2 93% on room air.  HEENT EXAM:  Benign.  LUNGS:  Clear on the right.  He has better air movement on the left than  on our previous exam, but he does have a few basilar crackles with some  slightly decreased breath sounds at his left base.  HEART:  Regular rate and rhythm without murmur.  ABDOMEN:  Benign.  EXTREMITIES:  Significant for 2+  pitting left lower extremity edema.  There is trace right-sided edema.  VITAL SIGNS:  Weight 173 pounds, up from 167 pounds at our last visit.  Temperature 98.8, blood pressure 124/70, heart rate 79, spO2 93% on room  air.   IMPRESSION:  1. History of left exudative effusion.  I suspect that this is due to      a viral pleurisy.  His effusion appears to be resolving, at least      by physical exam.  2. Lower extremity edema and weight-gain.  I am concerned that Mr.      [name] was using his ibuprofen more frequently than originally      intended.  This, coupled with his ACE inhibitor use, may have led      to some renal insufficiency.  I will evaluate as below.  We must      also consider  possible DVT.   PLANS:  1. Basic metabolic panel now.  2. I have asked him to stop taking his ibuprofen on a standing basis.      He will use his other pain medications on an as-needed basis.  3. Left lower extremity Doppler ultrasound to rule out DVT.  4. Chest x-ray today to insure there has been no reaccumulation of his      effusion.  5. Followup with [name] [name] at my next available appointment to discuss      the results of these studies.  I will call him if his renal      function is abnormal.     Leslye Peer, MD  Electronically Signed    RSB/MedQ  DD: 01/30/2007  DT: 01/30/2007  Job #: [id number]

## 2011-02-22 NOTE — Assessment & Plan Note (Signed)
[facility]                             PULMONARY OFFICE NOTE   NAME:[name], [name]                      MRN:          [id number]  DATE:01/02/2007                            DOB:          Jul 20, 1929    SUBJECTIVE:  [name] [name] is a 77 year old man who was last seen on March  18 and originally on March 14 for left sided pleural effusion.  He has  had thoracentesis, which showed that the effusion was an exudate.  Microbiology has been negative.  He returns today continuing to have  some pain that extends from his left greater than right back and flank  around to the front of his chest.  This pain is dull and is present at  all times.  He also has a different kind of mid chest discomfort that  occurs when he takes a deep breath, that is fleeting and sharp and seems  to be more prominent on the left as well.  He states that his breathing  is still difficult, although his exertional tolerance sounds to be about  the same as at our original visit.  He did not derive any significant  improvement in his breathing after his thoracentesis.  He has had  improvement in his rash from his herpes zoster.  He now has no itching  and the lesions have scabbed over per his report.  He has not had any  fevers, chills, sweats or other systemic signs or symptoms.   MEDICATIONS:  1. Fosamax 70 mg weekly.  2. Miacalcin 200 units daily.  3. Zoloft 150 mg daily.  4. Crestor 10 mg daily.  5. Altace 5 mg daily.  6. HCTZ 25 mg daily.  7. Oxycodone 40 mg b.i.d.  8. Calcium plus vitamin D once daily.  9. Centrum Silver once daily.  10.Fish oil 1200 mg daily.  11.Hydrocodone 10/500 mg q. 4 hours p.r.n. for pain.   EXAMINATION:  GENERAL:  This is a thin elderly man who is in no  distress.  He is comfortable, he does complain of discomfort on deep  inspiration.  His weight is 167 pounds, temperature 98.1, blood pressure 130/68, heart  rate 76, SPO2 is 96% on room air.  SKIN:   Significant for resolving scabbed vesicular rash that extended  from his left mid back around his left flank.  I do not see any lesions  on his left chest.  HEART:  Regular rate and rhythm without murmur.  LUNGS:  Decreased breath sounds at the lower most left base, but  certainly his air movement has improved compared with our original  meeting.  He is clear on the right, there is no wheezing.  ABDOMEN:  Benign.  EXTREMITIES:  No cyanosis, clubbing or edema.   CT scan of the chest performed on December 23, 2006 after his therapeutic  thoracentesis showed resolution of his lingular and left lower lobe  atelectasis without any evidence of a underlying pleural or parenchymal  mass.  No pleural plaques were noted.   IMPRESSION:  Left sided exudate of pleural effusion.  I suspect that  this was related to pleurisy.  The most likely etiology for pleurisy is  an underlying viral infection, although I have never seen pleurisy in  association with herpes zoster.  More likely this was associated with  another viral process.  There is no evidence currently for a mass or  other explanation for his effusion; likewise his cultures  have been  negative.  I would like to treat him conservatively and told him to use  nonsteroidal anti-inflammatory drugs for pain control.  I will follow up  with him in one month to ensure that his effusion is not re-  accumulating.  We will perform a chest x-ray at that time.  [name] [name]  will contact me in the interim if he has any difficulty.     Leslye Peer, MD  Electronically Signed    RSB/MedQ  DD: 01/02/2007  DT: 01/02/2007  Job #: [id number]   cc:   Georga Hacking, M.D.

## 2011-04-30 ENCOUNTER — Encounter (HOSPITAL_COMMUNITY): Payer: Medicare Other | Attending: Endocrinology

## 2011-04-30 DIAGNOSIS — M810 Age-related osteoporosis without current pathological fracture: Secondary | ICD-10-CM | POA: Insufficient documentation

## 2011-05-06 ENCOUNTER — Encounter (HOSPITAL_COMMUNITY): Payer: Medicare Other

## 2011-10-23 DIAGNOSIS — M810 Age-related osteoporosis without current pathological fracture: Secondary | ICD-10-CM | POA: Diagnosis not present

## 2011-10-23 DIAGNOSIS — R7301 Impaired fasting glucose: Secondary | ICD-10-CM | POA: Diagnosis not present

## 2011-10-23 DIAGNOSIS — D649 Anemia, unspecified: Secondary | ICD-10-CM | POA: Diagnosis not present

## 2011-10-23 DIAGNOSIS — I10 Essential (primary) hypertension: Secondary | ICD-10-CM | POA: Diagnosis not present

## 2011-10-23 DIAGNOSIS — Z87311 Personal history of (healed) other pathological fracture: Secondary | ICD-10-CM | POA: Diagnosis not present

## 2011-10-23 DIAGNOSIS — E7800 Pure hypercholesterolemia, unspecified: Secondary | ICD-10-CM | POA: Diagnosis not present

## 2011-10-25 DIAGNOSIS — D649 Anemia, unspecified: Secondary | ICD-10-CM | POA: Diagnosis not present

## 2011-10-25 DIAGNOSIS — I10 Essential (primary) hypertension: Secondary | ICD-10-CM | POA: Diagnosis not present

## 2011-10-25 DIAGNOSIS — R7301 Impaired fasting glucose: Secondary | ICD-10-CM | POA: Diagnosis not present

## 2011-10-25 DIAGNOSIS — E7800 Pure hypercholesterolemia, unspecified: Secondary | ICD-10-CM | POA: Diagnosis not present

## 2011-10-25 DIAGNOSIS — M8440XA Pathological fracture, unspecified site, initial encounter for fracture: Secondary | ICD-10-CM | POA: Diagnosis not present

## 2011-10-29 DIAGNOSIS — L570 Actinic keratosis: Secondary | ICD-10-CM | POA: Diagnosis not present

## 2011-10-29 DIAGNOSIS — D235 Other benign neoplasm of skin of trunk: Secondary | ICD-10-CM | POA: Diagnosis not present

## 2011-10-29 DIAGNOSIS — L219 Seborrheic dermatitis, unspecified: Secondary | ICD-10-CM | POA: Diagnosis not present

## 2011-12-12 DIAGNOSIS — M5030 Other cervical disc degeneration, unspecified cervical region: Secondary | ICD-10-CM | POA: Diagnosis not present

## 2012-01-28 DIAGNOSIS — I10 Essential (primary) hypertension: Secondary | ICD-10-CM | POA: Diagnosis not present

## 2012-01-28 DIAGNOSIS — E7800 Pure hypercholesterolemia, unspecified: Secondary | ICD-10-CM | POA: Diagnosis not present

## 2012-01-28 DIAGNOSIS — D649 Anemia, unspecified: Secondary | ICD-10-CM | POA: Diagnosis not present

## 2012-01-28 DIAGNOSIS — M8440XA Pathological fracture, unspecified site, initial encounter for fracture: Secondary | ICD-10-CM | POA: Diagnosis not present

## 2012-01-28 DIAGNOSIS — M810 Age-related osteoporosis without current pathological fracture: Secondary | ICD-10-CM | POA: Diagnosis not present

## 2012-01-28 DIAGNOSIS — R7301 Impaired fasting glucose: Secondary | ICD-10-CM | POA: Diagnosis not present

## 2012-01-31 DIAGNOSIS — D649 Anemia, unspecified: Secondary | ICD-10-CM | POA: Diagnosis not present

## 2012-01-31 DIAGNOSIS — M8440XA Pathological fracture, unspecified site, initial encounter for fracture: Secondary | ICD-10-CM | POA: Diagnosis not present

## 2012-01-31 DIAGNOSIS — R7301 Impaired fasting glucose: Secondary | ICD-10-CM | POA: Diagnosis not present

## 2012-01-31 DIAGNOSIS — E7800 Pure hypercholesterolemia, unspecified: Secondary | ICD-10-CM | POA: Diagnosis not present

## 2012-01-31 DIAGNOSIS — I10 Essential (primary) hypertension: Secondary | ICD-10-CM | POA: Diagnosis not present

## 2012-03-05 DIAGNOSIS — M5030 Other cervical disc degeneration, unspecified cervical region: Secondary | ICD-10-CM | POA: Diagnosis not present

## 2012-03-24 ENCOUNTER — Other Ambulatory Visit: Payer: Self-pay | Admitting: Dermatology

## 2012-03-24 DIAGNOSIS — C44319 Basal cell carcinoma of skin of other parts of face: Secondary | ICD-10-CM | POA: Diagnosis not present

## 2012-04-01 DIAGNOSIS — H2510 Age-related nuclear cataract, unspecified eye: Secondary | ICD-10-CM | POA: Diagnosis not present

## 2012-05-05 DIAGNOSIS — D649 Anemia, unspecified: Secondary | ICD-10-CM | POA: Diagnosis not present

## 2012-05-05 DIAGNOSIS — M8440XA Pathological fracture, unspecified site, initial encounter for fracture: Secondary | ICD-10-CM | POA: Diagnosis not present

## 2012-05-05 DIAGNOSIS — E7800 Pure hypercholesterolemia, unspecified: Secondary | ICD-10-CM | POA: Diagnosis not present

## 2012-05-05 DIAGNOSIS — I10 Essential (primary) hypertension: Secondary | ICD-10-CM | POA: Diagnosis not present

## 2012-05-05 DIAGNOSIS — R7301 Impaired fasting glucose: Secondary | ICD-10-CM | POA: Diagnosis not present

## 2012-05-07 DIAGNOSIS — D2360 Other benign neoplasm of skin of unspecified upper limb, including shoulder: Secondary | ICD-10-CM | POA: Diagnosis not present

## 2012-05-07 DIAGNOSIS — Z85828 Personal history of other malignant neoplasm of skin: Secondary | ICD-10-CM | POA: Diagnosis not present

## 2012-05-26 DIAGNOSIS — E7800 Pure hypercholesterolemia, unspecified: Secondary | ICD-10-CM | POA: Diagnosis not present

## 2012-05-26 DIAGNOSIS — D649 Anemia, unspecified: Secondary | ICD-10-CM | POA: Diagnosis not present

## 2012-05-26 DIAGNOSIS — I10 Essential (primary) hypertension: Secondary | ICD-10-CM | POA: Diagnosis not present

## 2012-05-26 DIAGNOSIS — K219 Gastro-esophageal reflux disease without esophagitis: Secondary | ICD-10-CM | POA: Diagnosis not present

## 2012-05-26 DIAGNOSIS — M8440XA Pathological fracture, unspecified site, initial encounter for fracture: Secondary | ICD-10-CM | POA: Diagnosis not present

## 2012-05-26 DIAGNOSIS — R7301 Impaired fasting glucose: Secondary | ICD-10-CM | POA: Diagnosis not present

## 2012-05-28 DIAGNOSIS — G894 Chronic pain syndrome: Secondary | ICD-10-CM | POA: Diagnosis not present

## 2012-05-28 DIAGNOSIS — M542 Cervicalgia: Secondary | ICD-10-CM | POA: Diagnosis not present

## 2012-05-28 DIAGNOSIS — M545 Low back pain: Secondary | ICD-10-CM | POA: Diagnosis not present

## 2012-06-02 ENCOUNTER — Other Ambulatory Visit (HOSPITAL_COMMUNITY): Payer: Self-pay | Admitting: *Deleted

## 2012-06-03 ENCOUNTER — Other Ambulatory Visit (HOSPITAL_COMMUNITY): Payer: Self-pay | Admitting: *Deleted

## 2012-06-03 ENCOUNTER — Ambulatory Visit (HOSPITAL_COMMUNITY)
Admission: RE | Admit: 2012-06-03 | Discharge: 2012-06-03 | Disposition: A | Discharge status: Home / Self Care | Payer: Medicare Other | Source: Ambulatory Visit | Attending: Endocrinology | Admitting: Endocrinology

## 2012-06-03 DIAGNOSIS — M810 Age-related osteoporosis without current pathological fracture: Secondary | ICD-10-CM | POA: Diagnosis not present

## 2012-06-03 MED ORDER — ZOLEDRONIC ACID 5 MG/100ML IV SOLN
INTRAVENOUS | Status: AC
  Administered 2012-06-03: 5 mg via INTRAVENOUS
  Filled 2012-06-03: qty 100

## 2012-06-03 MED ORDER — ZOLEDRONIC ACID 5 MG/100ML IV SOLN
5.0000 mg | Freq: Once | INTRAVENOUS | Status: AC
  Administered 2012-06-03: 5 mg via INTRAVENOUS

## 2012-06-03 MED ORDER — ZOLEDRONIC ACID 5 MG/100ML IV SOLN: 5.0000 mg | Freq: Once | INTRAVENOUS | Status: DC

## 2012-06-03 NOTE — Progress Notes (Signed)
1025 List of medications attached to chart

## 2012-06-23 DIAGNOSIS — R3915 Urgency of urination: Secondary | ICD-10-CM | POA: Diagnosis not present

## 2012-06-23 DIAGNOSIS — C61 Malignant neoplasm of prostate: Secondary | ICD-10-CM | POA: Diagnosis not present

## 2012-06-23 DIAGNOSIS — N318 Other neuromuscular dysfunction of bladder: Secondary | ICD-10-CM | POA: Diagnosis not present

## 2012-07-20 DIAGNOSIS — Z23 Encounter for immunization: Secondary | ICD-10-CM | POA: Diagnosis not present

## 2012-08-18 DIAGNOSIS — Z85828 Personal history of other malignant neoplasm of skin: Secondary | ICD-10-CM | POA: Diagnosis not present

## 2012-08-18 DIAGNOSIS — L570 Actinic keratosis: Secondary | ICD-10-CM | POA: Diagnosis not present

## 2012-08-27 DIAGNOSIS — I10 Essential (primary) hypertension: Secondary | ICD-10-CM | POA: Diagnosis not present

## 2012-08-27 DIAGNOSIS — D649 Anemia, unspecified: Secondary | ICD-10-CM | POA: Diagnosis not present

## 2012-08-27 DIAGNOSIS — R7301 Impaired fasting glucose: Secondary | ICD-10-CM | POA: Diagnosis not present

## 2012-08-27 DIAGNOSIS — E7800 Pure hypercholesterolemia, unspecified: Secondary | ICD-10-CM | POA: Diagnosis not present

## 2012-09-01 DIAGNOSIS — E7800 Pure hypercholesterolemia, unspecified: Secondary | ICD-10-CM | POA: Diagnosis not present

## 2012-09-01 DIAGNOSIS — M8440XA Pathological fracture, unspecified site, initial encounter for fracture: Secondary | ICD-10-CM | POA: Diagnosis not present

## 2012-09-01 DIAGNOSIS — K219 Gastro-esophageal reflux disease without esophagitis: Secondary | ICD-10-CM | POA: Diagnosis not present

## 2012-09-01 DIAGNOSIS — I10 Essential (primary) hypertension: Secondary | ICD-10-CM | POA: Diagnosis not present

## 2012-09-01 DIAGNOSIS — R7301 Impaired fasting glucose: Secondary | ICD-10-CM | POA: Diagnosis not present

## 2012-09-01 DIAGNOSIS — D649 Anemia, unspecified: Secondary | ICD-10-CM | POA: Diagnosis not present

## 2012-09-09 DIAGNOSIS — G894 Chronic pain syndrome: Secondary | ICD-10-CM | POA: Diagnosis not present

## 2012-09-09 DIAGNOSIS — M545 Low back pain: Secondary | ICD-10-CM | POA: Diagnosis not present

## 2012-10-15 DIAGNOSIS — H2510 Age-related nuclear cataract, unspecified eye: Secondary | ICD-10-CM | POA: Diagnosis not present

## 2012-12-04 DIAGNOSIS — M8440XA Pathological fracture, unspecified site, initial encounter for fracture: Secondary | ICD-10-CM | POA: Diagnosis not present

## 2012-12-04 DIAGNOSIS — E7800 Pure hypercholesterolemia, unspecified: Secondary | ICD-10-CM | POA: Diagnosis not present

## 2012-12-04 DIAGNOSIS — R7301 Impaired fasting glucose: Secondary | ICD-10-CM | POA: Diagnosis not present

## 2012-12-04 DIAGNOSIS — D649 Anemia, unspecified: Secondary | ICD-10-CM | POA: Diagnosis not present

## 2012-12-04 DIAGNOSIS — I10 Essential (primary) hypertension: Secondary | ICD-10-CM | POA: Diagnosis not present

## 2012-12-07 DIAGNOSIS — D649 Anemia, unspecified: Secondary | ICD-10-CM | POA: Diagnosis not present

## 2012-12-07 DIAGNOSIS — M8440XA Pathological fracture, unspecified site, initial encounter for fracture: Secondary | ICD-10-CM | POA: Diagnosis not present

## 2012-12-07 DIAGNOSIS — R7301 Impaired fasting glucose: Secondary | ICD-10-CM | POA: Diagnosis not present

## 2012-12-07 DIAGNOSIS — K219 Gastro-esophageal reflux disease without esophagitis: Secondary | ICD-10-CM | POA: Diagnosis not present

## 2012-12-07 DIAGNOSIS — I10 Essential (primary) hypertension: Secondary | ICD-10-CM | POA: Diagnosis not present

## 2012-12-07 DIAGNOSIS — E7800 Pure hypercholesterolemia, unspecified: Secondary | ICD-10-CM | POA: Diagnosis not present

## 2012-12-29 DIAGNOSIS — L259 Unspecified contact dermatitis, unspecified cause: Secondary | ICD-10-CM | POA: Diagnosis not present

## 2013-02-05 DIAGNOSIS — K921 Melena: Secondary | ICD-10-CM | POA: Diagnosis not present

## 2013-02-08 DIAGNOSIS — K625 Hemorrhage of anus and rectum: Secondary | ICD-10-CM | POA: Diagnosis not present

## 2013-02-17 ENCOUNTER — Other Ambulatory Visit: Payer: Self-pay | Admitting: Dermatology

## 2013-02-17 DIAGNOSIS — Z85828 Personal history of other malignant neoplasm of skin: Secondary | ICD-10-CM | POA: Diagnosis not present

## 2013-02-17 DIAGNOSIS — L500 Allergic urticaria: Secondary | ICD-10-CM | POA: Diagnosis not present

## 2013-02-17 DIAGNOSIS — L259 Unspecified contact dermatitis, unspecified cause: Secondary | ICD-10-CM | POA: Diagnosis not present

## 2013-02-17 DIAGNOSIS — Z0000 Encounter for general adult medical examination without abnormal findings: Secondary | ICD-10-CM | POA: Diagnosis not present

## 2013-02-24 DIAGNOSIS — Z794 Long term (current) use of insulin: Secondary | ICD-10-CM | POA: Diagnosis not present

## 2013-02-24 DIAGNOSIS — M545 Low back pain: Secondary | ICD-10-CM | POA: Diagnosis not present

## 2013-02-24 DIAGNOSIS — M5030 Other cervical disc degeneration, unspecified cervical region: Secondary | ICD-10-CM | POA: Diagnosis not present

## 2013-02-24 DIAGNOSIS — G894 Chronic pain syndrome: Secondary | ICD-10-CM | POA: Diagnosis not present

## 2013-04-21 DIAGNOSIS — L259 Unspecified contact dermatitis, unspecified cause: Secondary | ICD-10-CM | POA: Diagnosis not present

## 2013-05-13 DIAGNOSIS — H2510 Age-related nuclear cataract, unspecified eye: Secondary | ICD-10-CM | POA: Diagnosis not present

## 2013-06-24 DIAGNOSIS — D692 Other nonthrombocytopenic purpura: Secondary | ICD-10-CM | POA: Diagnosis not present

## 2013-07-01 DIAGNOSIS — H5210 Myopia, unspecified eye: Secondary | ICD-10-CM | POA: Diagnosis not present

## 2013-07-01 DIAGNOSIS — H2510 Age-related nuclear cataract, unspecified eye: Secondary | ICD-10-CM | POA: Diagnosis not present

## 2013-07-19 DIAGNOSIS — H2510 Age-related nuclear cataract, unspecified eye: Secondary | ICD-10-CM | POA: Diagnosis not present

## 2013-07-22 DIAGNOSIS — Z23 Encounter for immunization: Secondary | ICD-10-CM | POA: Diagnosis not present

## 2013-08-11 DIAGNOSIS — H269 Unspecified cataract: Secondary | ICD-10-CM | POA: Diagnosis not present

## 2013-08-11 DIAGNOSIS — H2510 Age-related nuclear cataract, unspecified eye: Secondary | ICD-10-CM | POA: Diagnosis not present

## 2013-08-11 DIAGNOSIS — H2589 Other age-related cataract: Secondary | ICD-10-CM | POA: Diagnosis not present

## 2013-08-23 DIAGNOSIS — H2510 Age-related nuclear cataract, unspecified eye: Secondary | ICD-10-CM | POA: Diagnosis not present

## 2013-08-25 DIAGNOSIS — H269 Unspecified cataract: Secondary | ICD-10-CM | POA: Diagnosis not present

## 2013-08-25 DIAGNOSIS — H2589 Other age-related cataract: Secondary | ICD-10-CM | POA: Diagnosis not present

## 2013-08-25 DIAGNOSIS — H2510 Age-related nuclear cataract, unspecified eye: Secondary | ICD-10-CM | POA: Diagnosis not present

## 2013-09-15 DIAGNOSIS — L259 Unspecified contact dermatitis, unspecified cause: Secondary | ICD-10-CM | POA: Diagnosis not present

## 2013-09-15 DIAGNOSIS — L570 Actinic keratosis: Secondary | ICD-10-CM | POA: Diagnosis not present

## 2013-09-21 DIAGNOSIS — M545 Low back pain: Secondary | ICD-10-CM | POA: Diagnosis not present

## 2013-10-25 DIAGNOSIS — M545 Low back pain, unspecified: Secondary | ICD-10-CM | POA: Diagnosis not present

## 2013-10-25 DIAGNOSIS — M5137 Other intervertebral disc degeneration, lumbosacral region: Secondary | ICD-10-CM | POA: Diagnosis not present

## 2013-10-25 DIAGNOSIS — M25559 Pain in unspecified hip: Secondary | ICD-10-CM | POA: Diagnosis not present

## 2013-11-15 DIAGNOSIS — R262 Difficulty in walking, not elsewhere classified: Secondary | ICD-10-CM | POA: Diagnosis not present

## 2013-11-15 DIAGNOSIS — M6281 Muscle weakness (generalized): Secondary | ICD-10-CM | POA: Diagnosis not present

## 2013-11-16 DIAGNOSIS — W57XXXA Bitten or stung by nonvenomous insect and other nonvenomous arthropods, initial encounter: Secondary | ICD-10-CM | POA: Diagnosis not present

## 2013-11-16 DIAGNOSIS — L089 Local infection of the skin and subcutaneous tissue, unspecified: Secondary | ICD-10-CM | POA: Diagnosis not present

## 2013-11-18 DIAGNOSIS — M6281 Muscle weakness (generalized): Secondary | ICD-10-CM | POA: Diagnosis not present

## 2013-11-18 DIAGNOSIS — R262 Difficulty in walking, not elsewhere classified: Secondary | ICD-10-CM | POA: Diagnosis not present

## 2013-11-23 DIAGNOSIS — M6281 Muscle weakness (generalized): Secondary | ICD-10-CM | POA: Diagnosis not present

## 2013-11-23 DIAGNOSIS — R262 Difficulty in walking, not elsewhere classified: Secondary | ICD-10-CM | POA: Diagnosis not present

## 2013-11-25 DIAGNOSIS — M6281 Muscle weakness (generalized): Secondary | ICD-10-CM | POA: Diagnosis not present

## 2013-11-25 DIAGNOSIS — R262 Difficulty in walking, not elsewhere classified: Secondary | ICD-10-CM | POA: Diagnosis not present

## 2013-11-30 DIAGNOSIS — R262 Difficulty in walking, not elsewhere classified: Secondary | ICD-10-CM | POA: Diagnosis not present

## 2013-11-30 DIAGNOSIS — M6281 Muscle weakness (generalized): Secondary | ICD-10-CM | POA: Diagnosis not present

## 2013-12-02 DIAGNOSIS — R262 Difficulty in walking, not elsewhere classified: Secondary | ICD-10-CM | POA: Diagnosis not present

## 2013-12-02 DIAGNOSIS — M6281 Muscle weakness (generalized): Secondary | ICD-10-CM | POA: Diagnosis not present

## 2013-12-07 DIAGNOSIS — R262 Difficulty in walking, not elsewhere classified: Secondary | ICD-10-CM | POA: Diagnosis not present

## 2013-12-07 DIAGNOSIS — M6281 Muscle weakness (generalized): Secondary | ICD-10-CM | POA: Diagnosis not present

## 2013-12-09 DIAGNOSIS — R262 Difficulty in walking, not elsewhere classified: Secondary | ICD-10-CM | POA: Diagnosis not present

## 2013-12-09 DIAGNOSIS — M6281 Muscle weakness (generalized): Secondary | ICD-10-CM | POA: Diagnosis not present

## 2013-12-14 DIAGNOSIS — M6281 Muscle weakness (generalized): Secondary | ICD-10-CM | POA: Diagnosis not present

## 2013-12-14 DIAGNOSIS — R262 Difficulty in walking, not elsewhere classified: Secondary | ICD-10-CM | POA: Diagnosis not present

## 2013-12-16 DIAGNOSIS — R262 Difficulty in walking, not elsewhere classified: Secondary | ICD-10-CM | POA: Diagnosis not present

## 2013-12-16 DIAGNOSIS — M6281 Muscle weakness (generalized): Secondary | ICD-10-CM | POA: Diagnosis not present

## 2013-12-21 DIAGNOSIS — M6281 Muscle weakness (generalized): Secondary | ICD-10-CM | POA: Diagnosis not present

## 2013-12-21 DIAGNOSIS — R262 Difficulty in walking, not elsewhere classified: Secondary | ICD-10-CM | POA: Diagnosis not present

## 2013-12-23 DIAGNOSIS — R262 Difficulty in walking, not elsewhere classified: Secondary | ICD-10-CM | POA: Diagnosis not present

## 2013-12-23 DIAGNOSIS — M6281 Muscle weakness (generalized): Secondary | ICD-10-CM | POA: Diagnosis not present

## 2013-12-28 DIAGNOSIS — R262 Difficulty in walking, not elsewhere classified: Secondary | ICD-10-CM | POA: Diagnosis not present

## 2013-12-28 DIAGNOSIS — M6281 Muscle weakness (generalized): Secondary | ICD-10-CM | POA: Diagnosis not present

## 2013-12-30 DIAGNOSIS — R262 Difficulty in walking, not elsewhere classified: Secondary | ICD-10-CM | POA: Diagnosis not present

## 2013-12-30 DIAGNOSIS — M6281 Muscle weakness (generalized): Secondary | ICD-10-CM | POA: Diagnosis not present

## 2014-01-04 DIAGNOSIS — R262 Difficulty in walking, not elsewhere classified: Secondary | ICD-10-CM | POA: Diagnosis not present

## 2014-01-04 DIAGNOSIS — M6281 Muscle weakness (generalized): Secondary | ICD-10-CM | POA: Diagnosis not present

## 2014-01-06 DIAGNOSIS — R262 Difficulty in walking, not elsewhere classified: Secondary | ICD-10-CM | POA: Diagnosis not present

## 2014-01-06 DIAGNOSIS — M6281 Muscle weakness (generalized): Secondary | ICD-10-CM | POA: Diagnosis not present

## 2014-01-06 DIAGNOSIS — L259 Unspecified contact dermatitis, unspecified cause: Secondary | ICD-10-CM | POA: Diagnosis not present

## 2014-01-11 DIAGNOSIS — M6281 Muscle weakness (generalized): Secondary | ICD-10-CM | POA: Diagnosis not present

## 2014-01-11 DIAGNOSIS — R262 Difficulty in walking, not elsewhere classified: Secondary | ICD-10-CM | POA: Diagnosis not present

## 2014-01-13 DIAGNOSIS — R262 Difficulty in walking, not elsewhere classified: Secondary | ICD-10-CM | POA: Diagnosis not present

## 2014-01-13 DIAGNOSIS — M6281 Muscle weakness (generalized): Secondary | ICD-10-CM | POA: Diagnosis not present

## 2014-01-27 DIAGNOSIS — C61 Malignant neoplasm of prostate: Secondary | ICD-10-CM | POA: Diagnosis not present

## 2014-01-27 DIAGNOSIS — M8440XA Pathological fracture, unspecified site, initial encounter for fracture: Secondary | ICD-10-CM | POA: Diagnosis not present

## 2014-01-27 DIAGNOSIS — E7800 Pure hypercholesterolemia, unspecified: Secondary | ICD-10-CM | POA: Diagnosis not present

## 2014-01-27 DIAGNOSIS — R351 Nocturia: Secondary | ICD-10-CM | POA: Diagnosis not present

## 2014-01-27 DIAGNOSIS — R7301 Impaired fasting glucose: Secondary | ICD-10-CM | POA: Diagnosis not present

## 2014-01-27 DIAGNOSIS — D649 Anemia, unspecified: Secondary | ICD-10-CM | POA: Diagnosis not present

## 2014-02-03 DIAGNOSIS — D649 Anemia, unspecified: Secondary | ICD-10-CM | POA: Diagnosis not present

## 2014-02-03 DIAGNOSIS — N183 Chronic kidney disease, stage 3 unspecified: Secondary | ICD-10-CM | POA: Diagnosis not present

## 2014-02-03 DIAGNOSIS — I10 Essential (primary) hypertension: Secondary | ICD-10-CM | POA: Diagnosis not present

## 2014-02-03 DIAGNOSIS — K219 Gastro-esophageal reflux disease without esophagitis: Secondary | ICD-10-CM | POA: Diagnosis not present

## 2014-02-03 DIAGNOSIS — M8440XA Pathological fracture, unspecified site, initial encounter for fracture: Secondary | ICD-10-CM | POA: Diagnosis not present

## 2014-02-03 DIAGNOSIS — R7301 Impaired fasting glucose: Secondary | ICD-10-CM | POA: Diagnosis not present

## 2014-02-03 DIAGNOSIS — E7800 Pure hypercholesterolemia, unspecified: Secondary | ICD-10-CM | POA: Diagnosis not present

## 2014-02-22 ENCOUNTER — Other Ambulatory Visit (HOSPITAL_COMMUNITY): Payer: Self-pay | Admitting: *Deleted

## 2014-02-23 ENCOUNTER — Encounter (HOSPITAL_COMMUNITY)
Admission: RE | Admit: 2014-02-23 | Discharge: 2014-02-23 | Disposition: A | Discharge status: Home / Self Care | Payer: Medicare Other | Source: Ambulatory Visit | Attending: Endocrinology | Admitting: Endocrinology

## 2014-02-23 DIAGNOSIS — M810 Age-related osteoporosis without current pathological fracture: Secondary | ICD-10-CM | POA: Insufficient documentation

## 2014-02-23 MED ORDER — ZOLEDRONIC ACID 5 MG/100ML IV SOLN
INTRAVENOUS | Status: AC
  Administered 2014-02-23: 5 mg via INTRAVENOUS
  Filled 2014-02-23: qty 100

## 2014-02-23 MED ORDER — ZOLEDRONIC ACID 5 MG/100ML IV SOLN
5.0000 mg | Freq: Once | INTRAVENOUS | Status: AC
  Administered 2014-02-23: 5 mg via INTRAVENOUS

## 2014-03-10 DIAGNOSIS — H26499 Other secondary cataract, unspecified eye: Secondary | ICD-10-CM | POA: Diagnosis not present

## 2014-03-10 DIAGNOSIS — H04129 Dry eye syndrome of unspecified lacrimal gland: Secondary | ICD-10-CM | POA: Diagnosis not present

## 2014-03-24 DIAGNOSIS — T6391XA Toxic effect of contact with unspecified venomous animal, accidental (unintentional), initial encounter: Secondary | ICD-10-CM | POA: Diagnosis not present

## 2014-03-29 DIAGNOSIS — Z5189 Encounter for other specified aftercare: Secondary | ICD-10-CM | POA: Diagnosis not present

## 2014-04-05 DIAGNOSIS — Z5189 Encounter for other specified aftercare: Secondary | ICD-10-CM | POA: Diagnosis not present

## 2014-05-05 DIAGNOSIS — N183 Chronic kidney disease, stage 3 unspecified: Secondary | ICD-10-CM | POA: Diagnosis not present

## 2014-05-05 DIAGNOSIS — D649 Anemia, unspecified: Secondary | ICD-10-CM | POA: Diagnosis not present

## 2014-05-05 DIAGNOSIS — R7301 Impaired fasting glucose: Secondary | ICD-10-CM | POA: Diagnosis not present

## 2014-05-05 DIAGNOSIS — E7800 Pure hypercholesterolemia, unspecified: Secondary | ICD-10-CM | POA: Diagnosis not present

## 2014-05-06 DIAGNOSIS — N183 Chronic kidney disease, stage 3 unspecified: Secondary | ICD-10-CM | POA: Diagnosis not present

## 2014-05-06 DIAGNOSIS — M8440XA Pathological fracture, unspecified site, initial encounter for fracture: Secondary | ICD-10-CM | POA: Diagnosis not present

## 2014-05-06 DIAGNOSIS — R7301 Impaired fasting glucose: Secondary | ICD-10-CM | POA: Diagnosis not present

## 2014-05-06 DIAGNOSIS — K219 Gastro-esophageal reflux disease without esophagitis: Secondary | ICD-10-CM | POA: Diagnosis not present

## 2014-05-06 DIAGNOSIS — D649 Anemia, unspecified: Secondary | ICD-10-CM | POA: Diagnosis not present

## 2014-05-06 DIAGNOSIS — I10 Essential (primary) hypertension: Secondary | ICD-10-CM | POA: Diagnosis not present

## 2014-05-06 DIAGNOSIS — E7800 Pure hypercholesterolemia, unspecified: Secondary | ICD-10-CM | POA: Diagnosis not present

## 2014-05-31 DIAGNOSIS — L259 Unspecified contact dermatitis, unspecified cause: Secondary | ICD-10-CM | POA: Diagnosis not present

## 2014-06-01 DIAGNOSIS — H6120 Impacted cerumen, unspecified ear: Secondary | ICD-10-CM | POA: Diagnosis not present

## 2014-06-02 DIAGNOSIS — L259 Unspecified contact dermatitis, unspecified cause: Secondary | ICD-10-CM | POA: Diagnosis not present

## 2014-07-21 DIAGNOSIS — L570 Actinic keratosis: Secondary | ICD-10-CM | POA: Diagnosis not present

## 2014-07-21 DIAGNOSIS — B079 Viral wart, unspecified: Secondary | ICD-10-CM | POA: Diagnosis not present

## 2014-08-04 DIAGNOSIS — I10 Essential (primary) hypertension: Secondary | ICD-10-CM | POA: Diagnosis not present

## 2014-08-04 DIAGNOSIS — D649 Anemia, unspecified: Secondary | ICD-10-CM | POA: Diagnosis not present

## 2014-08-04 DIAGNOSIS — R7301 Impaired fasting glucose: Secondary | ICD-10-CM | POA: Diagnosis not present

## 2014-08-04 DIAGNOSIS — E780 Pure hypercholesterolemia: Secondary | ICD-10-CM | POA: Diagnosis not present

## 2014-08-09 DIAGNOSIS — K219 Gastro-esophageal reflux disease without esophagitis: Secondary | ICD-10-CM | POA: Diagnosis not present

## 2014-08-09 DIAGNOSIS — D649 Anemia, unspecified: Secondary | ICD-10-CM | POA: Diagnosis not present

## 2014-08-09 DIAGNOSIS — Z23 Encounter for immunization: Secondary | ICD-10-CM | POA: Diagnosis not present

## 2014-08-09 DIAGNOSIS — N183 Chronic kidney disease, stage 3 (moderate): Secondary | ICD-10-CM | POA: Diagnosis not present

## 2014-08-09 DIAGNOSIS — E780 Pure hypercholesterolemia: Secondary | ICD-10-CM | POA: Diagnosis not present

## 2014-08-09 DIAGNOSIS — R7301 Impaired fasting glucose: Secondary | ICD-10-CM | POA: Diagnosis not present

## 2014-08-09 DIAGNOSIS — I10 Essential (primary) hypertension: Secondary | ICD-10-CM | POA: Diagnosis not present

## 2014-08-09 DIAGNOSIS — M810 Age-related osteoporosis without current pathological fracture: Secondary | ICD-10-CM | POA: Diagnosis not present

## 2014-08-25 DIAGNOSIS — D485 Neoplasm of uncertain behavior of skin: Secondary | ICD-10-CM | POA: Diagnosis not present

## 2014-08-25 DIAGNOSIS — L570 Actinic keratosis: Secondary | ICD-10-CM | POA: Diagnosis not present

## 2014-08-25 DIAGNOSIS — B079 Viral wart, unspecified: Secondary | ICD-10-CM | POA: Diagnosis not present

## 2014-10-31 DIAGNOSIS — K219 Gastro-esophageal reflux disease without esophagitis: Secondary | ICD-10-CM | POA: Diagnosis not present

## 2014-10-31 DIAGNOSIS — N183 Chronic kidney disease, stage 3 (moderate): Secondary | ICD-10-CM | POA: Diagnosis not present

## 2014-10-31 DIAGNOSIS — M810 Age-related osteoporosis without current pathological fracture: Secondary | ICD-10-CM | POA: Diagnosis not present

## 2014-10-31 DIAGNOSIS — R7301 Impaired fasting glucose: Secondary | ICD-10-CM | POA: Diagnosis not present

## 2014-10-31 DIAGNOSIS — I10 Essential (primary) hypertension: Secondary | ICD-10-CM | POA: Diagnosis not present

## 2014-10-31 DIAGNOSIS — D649 Anemia, unspecified: Secondary | ICD-10-CM | POA: Diagnosis not present

## 2014-10-31 DIAGNOSIS — Z23 Encounter for immunization: Secondary | ICD-10-CM | POA: Diagnosis not present

## 2014-10-31 DIAGNOSIS — E780 Pure hypercholesterolemia: Secondary | ICD-10-CM | POA: Diagnosis not present

## 2014-11-24 DIAGNOSIS — L309 Dermatitis, unspecified: Secondary | ICD-10-CM | POA: Diagnosis not present

## 2014-11-24 DIAGNOSIS — D485 Neoplasm of uncertain behavior of skin: Secondary | ICD-10-CM | POA: Diagnosis not present

## 2014-11-24 DIAGNOSIS — R238 Other skin changes: Secondary | ICD-10-CM | POA: Diagnosis not present

## 2014-11-29 DIAGNOSIS — M6281 Muscle weakness (generalized): Secondary | ICD-10-CM | POA: Diagnosis not present

## 2014-11-29 DIAGNOSIS — R2689 Other abnormalities of gait and mobility: Secondary | ICD-10-CM | POA: Diagnosis not present

## 2014-11-29 DIAGNOSIS — M8969 Osteopathy after poliomyelitis, multiple sites: Secondary | ICD-10-CM | POA: Diagnosis not present

## 2014-12-06 DIAGNOSIS — M6281 Muscle weakness (generalized): Secondary | ICD-10-CM | POA: Diagnosis not present

## 2014-12-06 DIAGNOSIS — M8969 Osteopathy after poliomyelitis, multiple sites: Secondary | ICD-10-CM | POA: Diagnosis not present

## 2014-12-06 DIAGNOSIS — R2689 Other abnormalities of gait and mobility: Secondary | ICD-10-CM | POA: Diagnosis not present

## 2014-12-20 DIAGNOSIS — M8969 Osteopathy after poliomyelitis, multiple sites: Secondary | ICD-10-CM | POA: Diagnosis not present

## 2014-12-20 DIAGNOSIS — R2689 Other abnormalities of gait and mobility: Secondary | ICD-10-CM | POA: Diagnosis not present

## 2014-12-20 DIAGNOSIS — M6281 Muscle weakness (generalized): Secondary | ICD-10-CM | POA: Diagnosis not present

## 2014-12-27 DIAGNOSIS — M8969 Osteopathy after poliomyelitis, multiple sites: Secondary | ICD-10-CM | POA: Diagnosis not present

## 2014-12-27 DIAGNOSIS — M6281 Muscle weakness (generalized): Secondary | ICD-10-CM | POA: Diagnosis not present

## 2014-12-27 DIAGNOSIS — R2689 Other abnormalities of gait and mobility: Secondary | ICD-10-CM | POA: Diagnosis not present

## 2015-01-03 DIAGNOSIS — R2689 Other abnormalities of gait and mobility: Secondary | ICD-10-CM | POA: Diagnosis not present

## 2015-01-03 DIAGNOSIS — M8969 Osteopathy after poliomyelitis, multiple sites: Secondary | ICD-10-CM | POA: Diagnosis not present

## 2015-01-03 DIAGNOSIS — M6281 Muscle weakness (generalized): Secondary | ICD-10-CM | POA: Diagnosis not present

## 2015-01-10 DIAGNOSIS — M6281 Muscle weakness (generalized): Secondary | ICD-10-CM | POA: Diagnosis not present

## 2015-01-10 DIAGNOSIS — R2689 Other abnormalities of gait and mobility: Secondary | ICD-10-CM | POA: Diagnosis not present

## 2015-01-10 DIAGNOSIS — M8969 Osteopathy after poliomyelitis, multiple sites: Secondary | ICD-10-CM | POA: Diagnosis not present

## 2015-01-16 ENCOUNTER — Other Ambulatory Visit: Payer: Self-pay | Admitting: Endocrinology

## 2015-01-16 ENCOUNTER — Ambulatory Visit
Admission: RE | Admit: 2015-01-16 | Discharge: 2015-01-16 | Disposition: A | Discharge status: Home / Self Care | Payer: Medicare Other | Source: Ambulatory Visit | Attending: Endocrinology | Admitting: Endocrinology

## 2015-01-16 DIAGNOSIS — R0781 Pleurodynia: Secondary | ICD-10-CM

## 2015-01-16 DIAGNOSIS — S299XXA Unspecified injury of thorax, initial encounter: Secondary | ICD-10-CM | POA: Diagnosis not present

## 2015-01-25 ENCOUNTER — Emergency Department (INDEPENDENT_AMBULATORY_CARE_PROVIDER_SITE_OTHER): Payer: Medicare Other

## 2015-01-25 ENCOUNTER — Encounter (HOSPITAL_COMMUNITY): Payer: Self-pay | Admitting: *Deleted

## 2015-01-25 ENCOUNTER — Emergency Department (INDEPENDENT_AMBULATORY_CARE_PROVIDER_SITE_OTHER)
Admission: EM | Admit: 2015-01-25 | Discharge: 2015-01-25 | Disposition: A | Discharge status: Home / Self Care | Payer: Medicare Other | Source: Home / Self Care | Attending: Family Medicine | Admitting: Family Medicine

## 2015-01-25 DIAGNOSIS — R609 Edema, unspecified: Secondary | ICD-10-CM

## 2015-01-25 DIAGNOSIS — R52 Pain, unspecified: Secondary | ICD-10-CM | POA: Diagnosis not present

## 2015-01-25 DIAGNOSIS — S20211S Contusion of right front wall of thorax, sequela: Secondary | ICD-10-CM | POA: Diagnosis not present

## 2015-01-25 DIAGNOSIS — R600 Localized edema: Secondary | ICD-10-CM

## 2015-01-25 DIAGNOSIS — R079 Chest pain, unspecified: Secondary | ICD-10-CM | POA: Diagnosis not present

## 2015-01-25 LAB — POCT I-STAT, CHEM 8
BUN: 22 mg/dL (ref 6–23)
CALCIUM ION: 1.14 mmol/L (ref 1.13–1.30)
Chloride: 99 mmol/L (ref 96–112)
Creatinine, Ser: 1.8 mg/dL — ABNORMAL HIGH (ref 0.50–1.35)
Glucose, Bld: 119 mg/dL — ABNORMAL HIGH (ref 70–99)
HCT: 34 % — ABNORMAL LOW (ref 39.0–52.0)
Hemoglobin: 11.6 g/dL — ABNORMAL LOW (ref 13.0–17.0)
Potassium: 4.3 mmol/L (ref 3.5–5.1)
SODIUM: 133 mmol/L — AB (ref 135–145)
TCO2: 23 mmol/L (ref 0–100)

## 2015-01-25 MED ORDER — FUROSEMIDE 20 MG PO TABS: 20.0000 mg | ORAL_TABLET | Freq: Every day | ORAL | Status: DC

## 2015-01-25 NOTE — ED Notes (Signed)
Pt  Reports  He  Felled        sev  Weeks  Ago   And  Injured  r    Side   He  Had  X  Rays  Done  At  That  Time   He  Reports  3  Days  Ago  The  Pain moved  From the  Ribs  To his  Chest he  Has  Pain on palpation   And  When  He  Sneezes /  Coughs     The  Pain  Is worse   He  Has  Some  Swelling  Of  His       Lower  extremitys

## 2015-01-25 NOTE — ED Provider Notes (Addendum)
CSN: [id number]     Arrival date & time 01/25/15  0912 History   First MD Initiated Contact with Patient 01/25/15 [phone]     Chief Complaint  Patient presents with  . Chest Injury   (Consider location/radiation/quality/duration/timing/severity/associated sxs/prior Treatment) Patient is a 81 y.o. male presenting with chest pain. The history is provided by the patient. No language interpreter was used.  Chest Pain Pain location:  L chest Pain quality: aching   Pain radiates to:  Does not radiate Pain radiates to the back: no   Pain severity:  Moderate Onset quality:  Gradual Duration:  3 weeks Timing:  Constant Chronicity:  New Context: movement   Relieved by:  Nothing Worsened by:  Exertion, deep breathing and coughing Ineffective treatments:  None tried Associated symptoms: no abdominal pain   Pt feel 3 weks ago and hit left ribs.   Pt reports he is still having pain in his left ribs and his chest.  Pt complains of swelling in his lower legs. No shortness of breath.  No recent travel  Past Medical History  Diagnosis Date  . Hypertension     systolic  . Osteoporosis   . Edema of extremities   . Syncope 2012  . Dyslipidemia   . Chronic renal insufficiency   . Prostate cancer   . Dysphagia   . Colitis   . Microhematuria   . Pleural effusion   . ED (erectile dysfunction)   . Anemia   . Onychomycosis   . Inguinal hernia   . Shingles   . Afebrile   . Hiatus hernia syndrome    History reviewed. No pertinent past surgical history. History reviewed. No pertinent family history. History  Substance Use Topics  . Smoking status: Not on file  . Smokeless tobacco: Not on file  . Alcohol Use: No    Review of Systems  Cardiovascular: Positive for chest pain.  Gastrointestinal: Negative for abdominal pain.  All other systems reviewed and are negative.   Allergies  Penicillins  Home Medications   Prior to Admission medications   Medication Sig Start Date End Date  Taking? Authorizing Provider  aspirin 81 MG tablet Take 81 mg by mouth daily.    Historical Provider, MD  calcitonin, salmon, (MIACALCIN/FORTICAL) 200 UNIT/ACT nasal spray Place 1 spray into alternate nostrils daily.    Historical Provider, MD  Calcium Carbonate (CALTRATE 600 PO) Take by mouth.    Historical Provider, MD  hydrochlorothiazide (MICROZIDE) 12.5 MG capsule Take 12.5 mg by mouth daily.    Historical Provider, MD  Multiple Vitamins-Minerals (CENTRUM SILVER PO) Take by mouth.    Historical Provider, MD  omeprazole (PRILOSEC) 20 MG capsule Take 20 mg by mouth daily.    Historical Provider, MD  OxyCODONE HCl ER (OXYCONTIN) 30 MG T12A Take by mouth.    Historical Provider, MD  polyethylene glycol (MIRALAX / GLYCOLAX) packet Take 17 g by mouth daily.    Historical Provider, MD  ramipril (ALTACE) 2.5 MG capsule Take 2.5 mg by mouth daily.    Historical Provider, MD  rosuvastatin (CRESTOR) 10 MG tablet Take 10 mg by mouth daily.    Historical Provider, MD  sertraline (ZOLOFT) 50 MG tablet Take 50 mg by mouth daily.    Historical Provider, MD  solifenacin (VESICARE) 10 MG tablet Take by mouth daily.    Historical Provider, MD  zoledronic acid (RECLAST) 5 MG/100ML SOLN injection Inject 5 mg into the vein once.    Historical Provider, MD  BP 175/84 mmHg  Pulse 95  Temp(Src) 97.7 F (36.5 C) (Oral)  Resp 16  SpO2 98% Physical Exam  Constitutional: He is oriented to person, place, and time. He appears well-developed and well-nourished.  HENT:  Head: Normocephalic and atraumatic.  Eyes: EOM are normal. Pupils are equal, round, and reactive to light.  Neck: Normal range of motion.  Cardiovascular: Normal rate and normal heart sounds.   Pulmonary/Chest: Effort normal.  Abdominal: He exhibits no distension.  Musculoskeletal: Normal range of motion.  Neurological: He is alert and oriented to person, place, and time.  Skin: Skin is warm.  Psychiatric: He has a normal mood and affect.   Nursing note and vitals reviewed.   ED Course  Procedures (including critical care time) Labs Review Labs Reviewed - No data to display  Imaging Review Dg Chest 2 View  01/25/2015   CLINICAL DATA:  Chest pain  EXAM: CHEST  2 VIEW  COMPARISON:  01/16/2015  FINDINGS: Heart size upper normal. Negative for heart failure. Negative for pneumonia or effusion. No change from the prior study. Chronic right-sided rib fractures.  IMPRESSION: No active cardiopulmonary disease.   Electronically Signed   By: Franchot Gallo M.D.   On: 01/25/2015 10:57   Normal sinus normal ekg normal no st changes, normal qrs. Normal qt ED ECG REPORT   Date: 02/01/2015  Rate: 90  Rhythm: normal sinus rhythm  QRS Axis: normal  Intervals: normal  ST/T Wave abnormalities: normal  Conduction Disutrbances:none  Narrative Interpretation:   Old EKG Reviewed: none available  I have personally reviewed the EKG tracing and agree with the computerized printout as noted. MDM I stat  Creat 1.8.   Chest xray no CHF normal ekg.  Pt given rx for lasix 20mg .   Pt advised to see Dr. Elyse Hsu this week (2 days for recheck)      1. Contusion, chest wall, right, sequela   2. Pain   3. Edema extremities    Lasix AVS   Fransico Meadow, PA-C 01/25/15 [place], [place] 02/01/15 1248

## 2015-01-25 NOTE — ED Notes (Signed)
Family requesting copies of today's visit to be sent to Dr Retia Passe' s office for Am appointment

## 2015-01-25 NOTE — Discharge Instructions (Signed)
Chest Contusion A chest contusion is a deep bruise on your chest area. Contusions are the result of an injury that caused bleeding under the skin. A chest contusion may involve bruising of the skin, muscles, or ribs. The contusion may turn blue, purple, or yellow. Minor injuries will give you a painless contusion, but more severe contusions may stay painful and swollen for a few weeks. CAUSES  A contusion is usually caused by a blow, trauma, or direct force to an area of the body. SYMPTOMS   Swelling and redness of the injured area.  Discoloration of the injured area.  Tenderness and soreness of the injured area.  Pain. DIAGNOSIS  The diagnosis can be made by taking a history and performing a physical exam. An X-ray, CT scan, or MRI may be needed to determine if there were any associated injuries, such as broken bones (fractures) or internal injuries. TREATMENT  Often, the best treatment for a chest contusion is resting, icing, and applying cold compresses to the injured area. Deep breathing exercises may be recommended to reduce the risk of pneumonia. Over-the-counter medicines may also be recommended for pain control. HOME CARE INSTRUCTIONS   Put ice on the injured area.  Put ice in a plastic bag.  Place a towel between your skin and the bag.  Leave the ice on for 15-20 minutes, 03-04 times a day.  Only take over-the-counter or prescription medicines as directed by your caregiver. Your caregiver may recommend avoiding anti-inflammatory medicines (aspirin, ibuprofen, and naproxen) for 48 hours because these medicines may increase bruising.  Rest the injured area.  Perform deep-breathing exercises as directed by your caregiver.  Stop smoking if you smoke.  Do not lift objects over 5 pounds (2.3 kg) for 3 days or longer if recommended by your caregiver. SEEK IMMEDIATE MEDICAL CARE IF:   You have increased bruising or swelling.  You have pain that is getting worse.  You have  difficulty breathing.  You have dizziness, weakness, or fainting.  You have blood in your urine or stool.  You cough up or vomit blood.  Your swelling or pain is not relieved with medicines. MAKE SURE YOU:   Understand these instructions.  Will watch your condition.  Will get help right away if you are not doing well or get worse. Document Released: 06/18/2001 Document Revised: 06/17/2012 Document Reviewed: 03/16/2012 [facility] Patient Information 2015 [place], [place]. This information is not intended to replace advice given to you by your health care provider. Make sure you discuss any questions you have with your health care provider. Edema Edema is an abnormal buildup of fluids in your bodytissues. Edema is somewhatdependent on gravity to pull the fluid to the lowest place in your body. That makes the condition more common in the legs and thighs (lower extremities). Painless swelling of the feet and ankles is common and becomes more likely as you get older. It is also common in looser tissues, like around your eyes.  When the affected area is squeezed, the fluid may move out of that spot and leave a dent for a few moments. This dent is called pitting.  CAUSES  There are many possible causes of edema. Eating too much salt and being on your feet or sitting for a long time can cause edema in your legs and ankles. Hot weather may make edema worse. Common medical causes of edema include:  Heart failure.  Liver disease.  Kidney disease.  Weak blood vessels in your legs.  Cancer.  An injury.  Pregnancy.  Some medications.  Obesity. SYMPTOMS  Edema is usually painless.Your skin may look swollen or shiny.  DIAGNOSIS  Your health care provider may be able to diagnose edema by asking about your medical history and doing a physical exam. You may need to have tests such as X-rays, an electrocardiogram, or blood tests to check for medical conditions that may cause edema.    TREATMENT  Edema treatment depends on the cause. If you have heart, liver, or kidney disease, you need the treatment appropriate for these conditions. General treatment may include:  Elevation of the affected body part above the level of your heart.  Compression of the affected body part. Pressure from elastic bandages or support stockings squeezes the tissues and forces fluid back into the blood vessels. This keeps fluid from entering the tissues.  Restriction of fluid and salt intake.  Use of a water pill (diuretic). These medications are appropriate only for some types of edema. They pull fluid out of your body and make you urinate more often. This gets rid of fluid and reduces swelling, but diuretics can have side effects. Only use diuretics as directed by your health care provider. HOME CARE INSTRUCTIONS   Keep the affected body part above the level of your heart when you are lying down.   Do not sit still or stand for prolonged periods.   Do not put anything directly under your knees when lying down.  Do not wear constricting clothing or garters on your upper legs.   Exercise your legs to work the fluid back into your blood vessels. This may help the swelling go down.   Wear elastic bandages or support stockings to reduce ankle swelling as directed by your health care provider.   Eat a low-salt diet to reduce fluid if your health care provider recommends it.   Only take medicines as directed by your health care provider. SEEK MEDICAL CARE IF:   Your edema is not responding to treatment.  You have heart, liver, or kidney disease and notice symptoms of edema.  You have edema in your legs that does not improve after elevating them.   You have sudden and unexplained weight gain. SEEK IMMEDIATE MEDICAL CARE IF:   You develop shortness of breath or chest pain.   You cannot breathe when you lie down.  You develop pain, redness, or warmth in the swollen areas.    You have heart, liver, or kidney disease and suddenly get edema.  You have a fever and your symptoms suddenly get worse. MAKE SURE YOU:   Understand these instructions.  Will watch your condition.  Will get help right away if you are not doing well or get worse. Document Released: 09/23/2005 Document Revised: 02/07/2014 Document Reviewed: 07/16/2013 [facility] Patient Information 2015 [place], [place]. This information is not intended to replace advice given to you by your health care provider. Make sure you discuss any questions you have with your health care provider.

## 2015-01-26 DIAGNOSIS — E780 Pure hypercholesterolemia: Secondary | ICD-10-CM | POA: Diagnosis not present

## 2015-01-26 DIAGNOSIS — I10 Essential (primary) hypertension: Secondary | ICD-10-CM | POA: Diagnosis not present

## 2015-01-26 DIAGNOSIS — D649 Anemia, unspecified: Secondary | ICD-10-CM | POA: Diagnosis not present

## 2015-01-26 DIAGNOSIS — K219 Gastro-esophageal reflux disease without esophagitis: Secondary | ICD-10-CM | POA: Diagnosis not present

## 2015-01-26 DIAGNOSIS — R7301 Impaired fasting glucose: Secondary | ICD-10-CM | POA: Diagnosis not present

## 2015-01-26 DIAGNOSIS — N183 Chronic kidney disease, stage 3 (moderate): Secondary | ICD-10-CM | POA: Diagnosis not present

## 2015-01-26 DIAGNOSIS — M810 Age-related osteoporosis without current pathological fracture: Secondary | ICD-10-CM | POA: Diagnosis not present

## 2015-02-23 DIAGNOSIS — M6281 Muscle weakness (generalized): Secondary | ICD-10-CM | POA: Diagnosis not present

## 2015-02-23 DIAGNOSIS — M8969 Osteopathy after poliomyelitis, multiple sites: Secondary | ICD-10-CM | POA: Diagnosis not present

## 2015-02-23 DIAGNOSIS — R2689 Other abnormalities of gait and mobility: Secondary | ICD-10-CM | POA: Diagnosis not present

## 2015-02-28 DIAGNOSIS — R2689 Other abnormalities of gait and mobility: Secondary | ICD-10-CM | POA: Diagnosis not present

## 2015-02-28 DIAGNOSIS — M8969 Osteopathy after poliomyelitis, multiple sites: Secondary | ICD-10-CM | POA: Diagnosis not present

## 2015-02-28 DIAGNOSIS — M6281 Muscle weakness (generalized): Secondary | ICD-10-CM | POA: Diagnosis not present

## 2015-03-02 DIAGNOSIS — R2689 Other abnormalities of gait and mobility: Secondary | ICD-10-CM | POA: Diagnosis not present

## 2015-03-02 DIAGNOSIS — M6281 Muscle weakness (generalized): Secondary | ICD-10-CM | POA: Diagnosis not present

## 2015-03-02 DIAGNOSIS — M8969 Osteopathy after poliomyelitis, multiple sites: Secondary | ICD-10-CM | POA: Diagnosis not present

## 2015-03-07 DIAGNOSIS — M8969 Osteopathy after poliomyelitis, multiple sites: Secondary | ICD-10-CM | POA: Diagnosis not present

## 2015-03-07 DIAGNOSIS — M6281 Muscle weakness (generalized): Secondary | ICD-10-CM | POA: Diagnosis not present

## 2015-03-07 DIAGNOSIS — R2689 Other abnormalities of gait and mobility: Secondary | ICD-10-CM | POA: Diagnosis not present

## 2015-03-09 DIAGNOSIS — M6281 Muscle weakness (generalized): Secondary | ICD-10-CM | POA: Diagnosis not present

## 2015-03-09 DIAGNOSIS — M8969 Osteopathy after poliomyelitis, multiple sites: Secondary | ICD-10-CM | POA: Diagnosis not present

## 2015-03-09 DIAGNOSIS — R2689 Other abnormalities of gait and mobility: Secondary | ICD-10-CM | POA: Diagnosis not present

## 2015-03-14 DIAGNOSIS — M6281 Muscle weakness (generalized): Secondary | ICD-10-CM | POA: Diagnosis not present

## 2015-03-14 DIAGNOSIS — R2689 Other abnormalities of gait and mobility: Secondary | ICD-10-CM | POA: Diagnosis not present

## 2015-03-14 DIAGNOSIS — M8969 Osteopathy after poliomyelitis, multiple sites: Secondary | ICD-10-CM | POA: Diagnosis not present

## 2015-03-16 ENCOUNTER — Other Ambulatory Visit: Payer: Self-pay | Admitting: Dermatology

## 2015-03-16 DIAGNOSIS — R2689 Other abnormalities of gait and mobility: Secondary | ICD-10-CM | POA: Diagnosis not present

## 2015-03-16 DIAGNOSIS — C44629 Squamous cell carcinoma of skin of left upper limb, including shoulder: Secondary | ICD-10-CM | POA: Diagnosis not present

## 2015-03-16 DIAGNOSIS — M8969 Osteopathy after poliomyelitis, multiple sites: Secondary | ICD-10-CM | POA: Diagnosis not present

## 2015-03-16 DIAGNOSIS — M6281 Muscle weakness (generalized): Secondary | ICD-10-CM | POA: Diagnosis not present

## 2015-03-21 DIAGNOSIS — M8969 Osteopathy after poliomyelitis, multiple sites: Secondary | ICD-10-CM | POA: Diagnosis not present

## 2015-03-21 DIAGNOSIS — M6281 Muscle weakness (generalized): Secondary | ICD-10-CM | POA: Diagnosis not present

## 2015-03-21 DIAGNOSIS — R2689 Other abnormalities of gait and mobility: Secondary | ICD-10-CM | POA: Diagnosis not present

## 2015-03-28 DIAGNOSIS — M6281 Muscle weakness (generalized): Secondary | ICD-10-CM | POA: Diagnosis not present

## 2015-03-28 DIAGNOSIS — M8969 Osteopathy after poliomyelitis, multiple sites: Secondary | ICD-10-CM | POA: Diagnosis not present

## 2015-03-28 DIAGNOSIS — R2689 Other abnormalities of gait and mobility: Secondary | ICD-10-CM | POA: Diagnosis not present

## 2015-03-30 DIAGNOSIS — R2689 Other abnormalities of gait and mobility: Secondary | ICD-10-CM | POA: Diagnosis not present

## 2015-03-30 DIAGNOSIS — M6281 Muscle weakness (generalized): Secondary | ICD-10-CM | POA: Diagnosis not present

## 2015-03-30 DIAGNOSIS — M8969 Osteopathy after poliomyelitis, multiple sites: Secondary | ICD-10-CM | POA: Diagnosis not present

## 2015-04-03 DIAGNOSIS — Z8546 Personal history of malignant neoplasm of prostate: Secondary | ICD-10-CM | POA: Diagnosis not present

## 2015-04-03 DIAGNOSIS — R3915 Urgency of urination: Secondary | ICD-10-CM | POA: Diagnosis not present

## 2015-04-04 DIAGNOSIS — M6281 Muscle weakness (generalized): Secondary | ICD-10-CM | POA: Diagnosis not present

## 2015-04-04 DIAGNOSIS — R2689 Other abnormalities of gait and mobility: Secondary | ICD-10-CM | POA: Diagnosis not present

## 2015-04-04 DIAGNOSIS — M8969 Osteopathy after poliomyelitis, multiple sites: Secondary | ICD-10-CM | POA: Diagnosis not present

## 2015-04-05 DIAGNOSIS — C44629 Squamous cell carcinoma of skin of left upper limb, including shoulder: Secondary | ICD-10-CM | POA: Diagnosis not present

## 2015-04-25 DIAGNOSIS — D649 Anemia, unspecified: Secondary | ICD-10-CM | POA: Diagnosis not present

## 2015-04-25 DIAGNOSIS — E780 Pure hypercholesterolemia: Secondary | ICD-10-CM | POA: Diagnosis not present

## 2015-04-25 DIAGNOSIS — I10 Essential (primary) hypertension: Secondary | ICD-10-CM | POA: Diagnosis not present

## 2015-04-25 DIAGNOSIS — R7301 Impaired fasting glucose: Secondary | ICD-10-CM | POA: Diagnosis not present

## 2015-04-28 DIAGNOSIS — R7301 Impaired fasting glucose: Secondary | ICD-10-CM | POA: Diagnosis not present

## 2015-04-28 DIAGNOSIS — I10 Essential (primary) hypertension: Secondary | ICD-10-CM | POA: Diagnosis not present

## 2015-04-28 DIAGNOSIS — D649 Anemia, unspecified: Secondary | ICD-10-CM | POA: Diagnosis not present

## 2015-04-28 DIAGNOSIS — N183 Chronic kidney disease, stage 3 (moderate): Secondary | ICD-10-CM | POA: Diagnosis not present

## 2015-04-28 DIAGNOSIS — M810 Age-related osteoporosis without current pathological fracture: Secondary | ICD-10-CM | POA: Diagnosis not present

## 2015-04-28 DIAGNOSIS — K219 Gastro-esophageal reflux disease without esophagitis: Secondary | ICD-10-CM | POA: Diagnosis not present

## 2015-04-28 DIAGNOSIS — E780 Pure hypercholesterolemia: Secondary | ICD-10-CM | POA: Diagnosis not present

## 2015-05-09 DIAGNOSIS — M6281 Muscle weakness (generalized): Secondary | ICD-10-CM | POA: Diagnosis not present

## 2015-05-09 DIAGNOSIS — R2689 Other abnormalities of gait and mobility: Secondary | ICD-10-CM | POA: Diagnosis not present

## 2015-05-09 DIAGNOSIS — M8969 Osteopathy after poliomyelitis, multiple sites: Secondary | ICD-10-CM | POA: Diagnosis not present

## 2015-05-11 DIAGNOSIS — R2689 Other abnormalities of gait and mobility: Secondary | ICD-10-CM | POA: Diagnosis not present

## 2015-05-11 DIAGNOSIS — M8969 Osteopathy after poliomyelitis, multiple sites: Secondary | ICD-10-CM | POA: Diagnosis not present

## 2015-05-11 DIAGNOSIS — M6281 Muscle weakness (generalized): Secondary | ICD-10-CM | POA: Diagnosis not present

## 2015-05-18 DIAGNOSIS — M8969 Osteopathy after poliomyelitis, multiple sites: Secondary | ICD-10-CM | POA: Diagnosis not present

## 2015-05-18 DIAGNOSIS — M6281 Muscle weakness (generalized): Secondary | ICD-10-CM | POA: Diagnosis not present

## 2015-05-18 DIAGNOSIS — R2689 Other abnormalities of gait and mobility: Secondary | ICD-10-CM | POA: Diagnosis not present

## 2015-05-23 DIAGNOSIS — M6281 Muscle weakness (generalized): Secondary | ICD-10-CM | POA: Diagnosis not present

## 2015-05-23 DIAGNOSIS — M8969 Osteopathy after poliomyelitis, multiple sites: Secondary | ICD-10-CM | POA: Diagnosis not present

## 2015-05-23 DIAGNOSIS — R2689 Other abnormalities of gait and mobility: Secondary | ICD-10-CM | POA: Diagnosis not present

## 2015-05-25 DIAGNOSIS — R2689 Other abnormalities of gait and mobility: Secondary | ICD-10-CM | POA: Diagnosis not present

## 2015-05-25 DIAGNOSIS — M6281 Muscle weakness (generalized): Secondary | ICD-10-CM | POA: Diagnosis not present

## 2015-05-25 DIAGNOSIS — M8969 Osteopathy after poliomyelitis, multiple sites: Secondary | ICD-10-CM | POA: Diagnosis not present

## 2015-05-30 DIAGNOSIS — M6281 Muscle weakness (generalized): Secondary | ICD-10-CM | POA: Diagnosis not present

## 2015-05-30 DIAGNOSIS — M8969 Osteopathy after poliomyelitis, multiple sites: Secondary | ICD-10-CM | POA: Diagnosis not present

## 2015-05-30 DIAGNOSIS — R2689 Other abnormalities of gait and mobility: Secondary | ICD-10-CM | POA: Diagnosis not present

## 2015-06-01 DIAGNOSIS — R2689 Other abnormalities of gait and mobility: Secondary | ICD-10-CM | POA: Diagnosis not present

## 2015-06-01 DIAGNOSIS — M6281 Muscle weakness (generalized): Secondary | ICD-10-CM | POA: Diagnosis not present

## 2015-06-01 DIAGNOSIS — M8969 Osteopathy after poliomyelitis, multiple sites: Secondary | ICD-10-CM | POA: Diagnosis not present

## 2015-06-06 DIAGNOSIS — M6281 Muscle weakness (generalized): Secondary | ICD-10-CM | POA: Diagnosis not present

## 2015-06-06 DIAGNOSIS — M8969 Osteopathy after poliomyelitis, multiple sites: Secondary | ICD-10-CM | POA: Diagnosis not present

## 2015-06-06 DIAGNOSIS — R2689 Other abnormalities of gait and mobility: Secondary | ICD-10-CM | POA: Diagnosis not present

## 2015-06-13 DIAGNOSIS — R2689 Other abnormalities of gait and mobility: Secondary | ICD-10-CM | POA: Diagnosis not present

## 2015-06-13 DIAGNOSIS — M8969 Osteopathy after poliomyelitis, multiple sites: Secondary | ICD-10-CM | POA: Diagnosis not present

## 2015-06-13 DIAGNOSIS — M6281 Muscle weakness (generalized): Secondary | ICD-10-CM | POA: Diagnosis not present

## 2015-06-20 DIAGNOSIS — M8969 Osteopathy after poliomyelitis, multiple sites: Secondary | ICD-10-CM | POA: Diagnosis not present

## 2015-06-20 DIAGNOSIS — R2689 Other abnormalities of gait and mobility: Secondary | ICD-10-CM | POA: Diagnosis not present

## 2015-06-20 DIAGNOSIS — M6281 Muscle weakness (generalized): Secondary | ICD-10-CM | POA: Diagnosis not present

## 2015-06-21 DIAGNOSIS — N183 Chronic kidney disease, stage 3 (moderate): Secondary | ICD-10-CM | POA: Diagnosis not present

## 2015-06-21 DIAGNOSIS — E780 Pure hypercholesterolemia: Secondary | ICD-10-CM | POA: Diagnosis not present

## 2015-06-21 DIAGNOSIS — D649 Anemia, unspecified: Secondary | ICD-10-CM | POA: Diagnosis not present

## 2015-06-21 DIAGNOSIS — M810 Age-related osteoporosis without current pathological fracture: Secondary | ICD-10-CM | POA: Diagnosis not present

## 2015-06-21 DIAGNOSIS — I10 Essential (primary) hypertension: Secondary | ICD-10-CM | POA: Diagnosis not present

## 2015-06-21 DIAGNOSIS — R7301 Impaired fasting glucose: Secondary | ICD-10-CM | POA: Diagnosis not present

## 2015-06-21 DIAGNOSIS — K219 Gastro-esophageal reflux disease without esophagitis: Secondary | ICD-10-CM | POA: Diagnosis not present

## 2015-06-22 DIAGNOSIS — M6281 Muscle weakness (generalized): Secondary | ICD-10-CM | POA: Diagnosis not present

## 2015-06-22 DIAGNOSIS — R2689 Other abnormalities of gait and mobility: Secondary | ICD-10-CM | POA: Diagnosis not present

## 2015-06-22 DIAGNOSIS — M8969 Osteopathy after poliomyelitis, multiple sites: Secondary | ICD-10-CM | POA: Diagnosis not present

## 2015-06-27 DIAGNOSIS — R600 Localized edema: Secondary | ICD-10-CM | POA: Diagnosis not present

## 2015-06-27 DIAGNOSIS — R0602 Shortness of breath: Secondary | ICD-10-CM | POA: Diagnosis not present

## 2015-06-27 DIAGNOSIS — E785 Hyperlipidemia, unspecified: Secondary | ICD-10-CM | POA: Diagnosis not present

## 2015-06-27 DIAGNOSIS — I10 Essential (primary) hypertension: Secondary | ICD-10-CM | POA: Diagnosis not present

## 2015-06-28 DIAGNOSIS — R0602 Shortness of breath: Secondary | ICD-10-CM | POA: Diagnosis not present

## 2015-07-04 DIAGNOSIS — M6281 Muscle weakness (generalized): Secondary | ICD-10-CM | POA: Diagnosis not present

## 2015-07-04 DIAGNOSIS — R2689 Other abnormalities of gait and mobility: Secondary | ICD-10-CM | POA: Diagnosis not present

## 2015-07-04 DIAGNOSIS — M8969 Osteopathy after poliomyelitis, multiple sites: Secondary | ICD-10-CM | POA: Diagnosis not present

## 2015-07-07 DIAGNOSIS — R600 Localized edema: Secondary | ICD-10-CM | POA: Diagnosis not present

## 2015-07-07 DIAGNOSIS — Z87898 Personal history of other specified conditions: Secondary | ICD-10-CM | POA: Diagnosis not present

## 2015-07-07 DIAGNOSIS — I10 Essential (primary) hypertension: Secondary | ICD-10-CM | POA: Diagnosis not present

## 2015-07-07 DIAGNOSIS — R0602 Shortness of breath: Secondary | ICD-10-CM | POA: Diagnosis not present

## 2015-07-18 DIAGNOSIS — N183 Chronic kidney disease, stage 3 (moderate): Secondary | ICD-10-CM | POA: Diagnosis not present

## 2015-07-25 DIAGNOSIS — M6281 Muscle weakness (generalized): Secondary | ICD-10-CM | POA: Diagnosis not present

## 2015-07-25 DIAGNOSIS — M8969 Osteopathy after poliomyelitis, multiple sites: Secondary | ICD-10-CM | POA: Diagnosis not present

## 2015-07-25 DIAGNOSIS — R2689 Other abnormalities of gait and mobility: Secondary | ICD-10-CM | POA: Diagnosis not present

## 2015-07-26 DIAGNOSIS — D649 Anemia, unspecified: Secondary | ICD-10-CM | POA: Diagnosis not present

## 2015-07-26 DIAGNOSIS — R7301 Impaired fasting glucose: Secondary | ICD-10-CM | POA: Diagnosis not present

## 2015-07-26 DIAGNOSIS — I10 Essential (primary) hypertension: Secondary | ICD-10-CM | POA: Diagnosis not present

## 2015-07-26 DIAGNOSIS — E7800 Pure hypercholesterolemia, unspecified: Secondary | ICD-10-CM | POA: Diagnosis not present

## 2015-07-27 DIAGNOSIS — R2689 Other abnormalities of gait and mobility: Secondary | ICD-10-CM | POA: Diagnosis not present

## 2015-07-27 DIAGNOSIS — M6281 Muscle weakness (generalized): Secondary | ICD-10-CM | POA: Diagnosis not present

## 2015-07-27 DIAGNOSIS — M8969 Osteopathy after poliomyelitis, multiple sites: Secondary | ICD-10-CM | POA: Diagnosis not present

## 2015-07-28 DIAGNOSIS — N183 Chronic kidney disease, stage 3 (moderate): Secondary | ICD-10-CM | POA: Diagnosis not present

## 2015-07-28 DIAGNOSIS — I10 Essential (primary) hypertension: Secondary | ICD-10-CM | POA: Diagnosis not present

## 2015-07-28 DIAGNOSIS — E7800 Pure hypercholesterolemia, unspecified: Secondary | ICD-10-CM | POA: Diagnosis not present

## 2015-07-28 DIAGNOSIS — K219 Gastro-esophageal reflux disease without esophagitis: Secondary | ICD-10-CM | POA: Diagnosis not present

## 2015-07-28 DIAGNOSIS — D649 Anemia, unspecified: Secondary | ICD-10-CM | POA: Diagnosis not present

## 2015-07-28 DIAGNOSIS — M810 Age-related osteoporosis without current pathological fracture: Secondary | ICD-10-CM | POA: Diagnosis not present

## 2015-07-28 DIAGNOSIS — Z23 Encounter for immunization: Secondary | ICD-10-CM | POA: Diagnosis not present

## 2015-07-28 DIAGNOSIS — R7301 Impaired fasting glucose: Secondary | ICD-10-CM | POA: Diagnosis not present

## 2015-08-01 DIAGNOSIS — M8969 Osteopathy after poliomyelitis, multiple sites: Secondary | ICD-10-CM | POA: Diagnosis not present

## 2015-08-01 DIAGNOSIS — M6281 Muscle weakness (generalized): Secondary | ICD-10-CM | POA: Diagnosis not present

## 2015-08-01 DIAGNOSIS — R2689 Other abnormalities of gait and mobility: Secondary | ICD-10-CM | POA: Diagnosis not present

## 2015-08-03 DIAGNOSIS — M6281 Muscle weakness (generalized): Secondary | ICD-10-CM | POA: Diagnosis not present

## 2015-08-03 DIAGNOSIS — M8969 Osteopathy after poliomyelitis, multiple sites: Secondary | ICD-10-CM | POA: Diagnosis not present

## 2015-08-03 DIAGNOSIS — R2689 Other abnormalities of gait and mobility: Secondary | ICD-10-CM | POA: Diagnosis not present

## 2015-08-04 DIAGNOSIS — R0602 Shortness of breath: Secondary | ICD-10-CM | POA: Diagnosis not present

## 2015-08-04 DIAGNOSIS — I10 Essential (primary) hypertension: Secondary | ICD-10-CM | POA: Diagnosis not present

## 2015-08-04 DIAGNOSIS — R600 Localized edema: Secondary | ICD-10-CM | POA: Diagnosis not present

## 2015-08-04 DIAGNOSIS — Z87898 Personal history of other specified conditions: Secondary | ICD-10-CM | POA: Diagnosis not present

## 2015-08-08 DIAGNOSIS — R2689 Other abnormalities of gait and mobility: Secondary | ICD-10-CM | POA: Diagnosis not present

## 2015-08-08 DIAGNOSIS — M8969 Osteopathy after poliomyelitis, multiple sites: Secondary | ICD-10-CM | POA: Diagnosis not present

## 2015-08-08 DIAGNOSIS — M6281 Muscle weakness (generalized): Secondary | ICD-10-CM | POA: Diagnosis not present

## 2015-08-10 DIAGNOSIS — H26493 Other secondary cataract, bilateral: Secondary | ICD-10-CM | POA: Diagnosis not present

## 2015-08-10 DIAGNOSIS — H35033 Hypertensive retinopathy, bilateral: Secondary | ICD-10-CM | POA: Diagnosis not present

## 2015-08-10 DIAGNOSIS — H524 Presbyopia: Secondary | ICD-10-CM | POA: Diagnosis not present

## 2015-08-15 ENCOUNTER — Encounter (INDEPENDENT_AMBULATORY_CARE_PROVIDER_SITE_OTHER): Payer: Medicare Other | Admitting: Ophthalmology

## 2015-08-15 DIAGNOSIS — H34832 Tributary (branch) retinal vein occlusion, left eye, with macular edema: Secondary | ICD-10-CM | POA: Diagnosis not present

## 2015-08-15 DIAGNOSIS — H353132 Nonexudative age-related macular degeneration, bilateral, intermediate dry stage: Secondary | ICD-10-CM

## 2015-08-15 DIAGNOSIS — H43813 Vitreous degeneration, bilateral: Secondary | ICD-10-CM

## 2015-08-22 DIAGNOSIS — R2689 Other abnormalities of gait and mobility: Secondary | ICD-10-CM | POA: Diagnosis not present

## 2015-08-22 DIAGNOSIS — M6281 Muscle weakness (generalized): Secondary | ICD-10-CM | POA: Diagnosis not present

## 2015-08-22 DIAGNOSIS — M8969 Osteopathy after poliomyelitis, multiple sites: Secondary | ICD-10-CM | POA: Diagnosis not present

## 2015-08-24 ENCOUNTER — Encounter (INDEPENDENT_AMBULATORY_CARE_PROVIDER_SITE_OTHER): Payer: Medicare Other | Admitting: Ophthalmology

## 2015-08-24 DIAGNOSIS — H348321 Tributary (branch) retinal vein occlusion, left eye, with retinal neovascularization: Secondary | ICD-10-CM

## 2015-09-01 ENCOUNTER — Encounter (HOSPITAL_COMMUNITY): Payer: Self-pay | Admitting: Emergency Medicine

## 2015-09-01 ENCOUNTER — Inpatient Hospital Stay (HOSPITAL_COMMUNITY)
Admission: EM | Admit: 2015-09-01 | Discharge: 2015-09-04 | DRG: 445 | Disposition: A | Discharge status: Home / Self Care | Payer: Medicare Other | Attending: Internal Medicine | Admitting: Internal Medicine

## 2015-09-01 ENCOUNTER — Encounter (HOSPITAL_COMMUNITY): Payer: Self-pay | Admitting: Vascular Surgery

## 2015-09-01 ENCOUNTER — Emergency Department (INDEPENDENT_AMBULATORY_CARE_PROVIDER_SITE_OTHER)
Admission: EM | Admit: 2015-09-01 | Discharge: 2015-09-01 | Disposition: A | Discharge status: Nursing Home (Includes ICF) | Payer: Medicare Other | Source: Home / Self Care | Attending: Family Medicine | Admitting: Family Medicine

## 2015-09-01 ENCOUNTER — Emergency Department (HOSPITAL_COMMUNITY): Payer: Medicare Other

## 2015-09-01 DIAGNOSIS — Z808 Family history of malignant neoplasm of other organs or systems: Secondary | ICD-10-CM

## 2015-09-01 DIAGNOSIS — E871 Hypo-osmolality and hyponatremia: Secondary | ICD-10-CM | POA: Diagnosis present

## 2015-09-01 DIAGNOSIS — R7401 Elevation of levels of liver transaminase levels: Secondary | ICD-10-CM | POA: Diagnosis present

## 2015-09-01 DIAGNOSIS — E876 Hypokalemia: Secondary | ICD-10-CM | POA: Diagnosis not present

## 2015-09-01 DIAGNOSIS — Z801 Family history of malignant neoplasm of trachea, bronchus and lung: Secondary | ICD-10-CM

## 2015-09-01 DIAGNOSIS — R740 Nonspecific elevation of levels of transaminase and lactic acid dehydrogenase [LDH]: Secondary | ICD-10-CM | POA: Diagnosis not present

## 2015-09-01 DIAGNOSIS — I10 Essential (primary) hypertension: Secondary | ICD-10-CM | POA: Diagnosis not present

## 2015-09-01 DIAGNOSIS — M791 Myalgia, unspecified site: Secondary | ICD-10-CM

## 2015-09-01 DIAGNOSIS — N183 Chronic kidney disease, stage 3 unspecified: Secondary | ICD-10-CM

## 2015-09-01 DIAGNOSIS — Z87891 Personal history of nicotine dependence: Secondary | ICD-10-CM | POA: Diagnosis not present

## 2015-09-01 DIAGNOSIS — Z800 Family history of malignant neoplasm of digestive organs: Secondary | ICD-10-CM | POA: Diagnosis not present

## 2015-09-01 DIAGNOSIS — H3530 Unspecified macular degeneration: Secondary | ICD-10-CM | POA: Diagnosis not present

## 2015-09-01 DIAGNOSIS — R10811 Right upper quadrant abdominal tenderness: Secondary | ICD-10-CM

## 2015-09-01 DIAGNOSIS — J3489 Other specified disorders of nose and nasal sinuses: Secondary | ICD-10-CM

## 2015-09-01 DIAGNOSIS — E785 Hyperlipidemia, unspecified: Secondary | ICD-10-CM | POA: Diagnosis present

## 2015-09-01 DIAGNOSIS — R05 Cough: Secondary | ICD-10-CM | POA: Diagnosis not present

## 2015-09-01 DIAGNOSIS — R0989 Other specified symptoms and signs involving the circulatory and respiratory systems: Secondary | ICD-10-CM

## 2015-09-01 DIAGNOSIS — I129 Hypertensive chronic kidney disease with stage 1 through stage 4 chronic kidney disease, or unspecified chronic kidney disease: Secondary | ICD-10-CM | POA: Diagnosis present

## 2015-09-01 DIAGNOSIS — N39498 Other specified urinary incontinence: Secondary | ICD-10-CM

## 2015-09-01 DIAGNOSIS — Z8546 Personal history of malignant neoplasm of prostate: Secondary | ICD-10-CM | POA: Diagnosis not present

## 2015-09-01 DIAGNOSIS — K831 Obstruction of bile duct: Principal | ICD-10-CM | POA: Diagnosis present

## 2015-09-01 DIAGNOSIS — R32 Unspecified urinary incontinence: Secondary | ICD-10-CM | POA: Diagnosis present

## 2015-09-01 DIAGNOSIS — R932 Abnormal findings on diagnostic imaging of liver and biliary tract: Secondary | ICD-10-CM | POA: Diagnosis not present

## 2015-09-01 DIAGNOSIS — L299 Pruritus, unspecified: Secondary | ICD-10-CM | POA: Diagnosis present

## 2015-09-01 DIAGNOSIS — R17 Unspecified jaundice: Secondary | ICD-10-CM

## 2015-09-01 DIAGNOSIS — N179 Acute kidney failure, unspecified: Secondary | ICD-10-CM | POA: Diagnosis present

## 2015-09-01 DIAGNOSIS — N189 Chronic kidney disease, unspecified: Secondary | ICD-10-CM | POA: Diagnosis not present

## 2015-09-01 DIAGNOSIS — R945 Abnormal results of liver function studies: Secondary | ICD-10-CM | POA: Diagnosis not present

## 2015-09-01 DIAGNOSIS — C61 Malignant neoplasm of prostate: Secondary | ICD-10-CM

## 2015-09-01 DIAGNOSIS — R531 Weakness: Secondary | ICD-10-CM | POA: Diagnosis not present

## 2015-09-01 LAB — COMPREHENSIVE METABOLIC PANEL
ALBUMIN: 3.8 g/dL (ref 3.5–5.0)
ALT: 499 U/L — AB (ref 17–63)
AST: 459 U/L — AB (ref 15–41)
Alkaline Phosphatase: 844 U/L — ABNORMAL HIGH (ref 38–126)
Anion gap: 12 (ref 5–15)
BILIRUBIN TOTAL: 22.4 mg/dL — AB (ref 0.3–1.2)
BUN: 28 mg/dL — AB (ref 6–20)
CHLORIDE: 84 mmol/L — AB (ref 101–111)
CO2: 23 mmol/L (ref 22–32)
CREATININE: 2.23 mg/dL — AB (ref 0.61–1.24)
Calcium: 9.5 mg/dL (ref 8.9–10.3)
GFR calc Af Amer: 29 mL/min — ABNORMAL LOW (ref >60)
GFR calc non Af Amer: 25 mL/min — ABNORMAL LOW (ref >60)
GLUCOSE: 115 mg/dL — AB (ref 65–99)
POTASSIUM: 3.5 mmol/L (ref 3.5–5.1)
Sodium: 119 mmol/L — CL (ref 135–145)
Total Protein: 7.8 g/dL (ref 6.5–8.1)

## 2015-09-01 LAB — LIPASE, BLOOD: LIPASE: 111 U/L — AB (ref 11–51)

## 2015-09-01 LAB — URINALYSIS, ROUTINE W REFLEX MICROSCOPIC
GLUCOSE, UA: NEGATIVE mg/dL
HGB URINE DIPSTICK: NEGATIVE
KETONES UR: NEGATIVE mg/dL
Leukocytes, UA: NEGATIVE
Nitrite: NEGATIVE
PH: 5.5 (ref 5.0–8.0)
Protein, ur: NEGATIVE mg/dL
Specific Gravity, Urine: 1.01 (ref 1.005–1.030)

## 2015-09-01 LAB — CBC
HEMATOCRIT: 29 % — AB (ref 39.0–52.0)
Hemoglobin: 10 g/dL — ABNORMAL LOW (ref 13.0–17.0)
MCH: 33.6 pg (ref 26.0–34.0)
MCHC: 34.5 g/dL (ref 30.0–36.0)
MCV: 97.3 fL (ref 78.0–100.0)
Platelets: 224 10*3/uL (ref 150–400)
RBC: 2.98 MIL/uL — ABNORMAL LOW (ref 4.22–5.81)
RDW: 14.6 % (ref 11.5–15.5)
WBC: 6.6 10*3/uL (ref 4.0–10.5)

## 2015-09-01 NOTE — ED Provider Notes (Signed)
CSN: [id number]     Arrival date & time 09/01/15  1302 History   First MD Initiated Contact with Patient 09/01/15 1339     Chief Complaint  Patient presents with  . URI  . Jaundice   (Consider location/radiation/quality/duration/timing/severity/associated sxs/prior Treatment) HPI Comments: 81 year old male is accompanied by his wife and daughter with complains of achiness all over, myalgias, runny nose for approximately one week. He has an occasional nonproductive cough. Denies shortness of breath, chest pain or fever. Denies earache or sore throat. His daughter noticed that he was turning yellow this week. Patient noted that his urine was change in color to a more yellowish are not. He is hard of hearing but answers questions appropriately. He is fully awake, alert and responsive. No acute distress.   Past Medical History  Diagnosis Date  . Hypertension     systolic  . Osteoporosis   . Edema of extremities   . Syncope 2012  . Dyslipidemia   . Chronic renal insufficiency   . Prostate cancer ([place])   . Dysphagia   . Colitis   . Microhematuria   . Pleural effusion   . ED (erectile dysfunction)   . Anemia   . Onychomycosis   . Inguinal hernia   . Shingles   . Afebrile   . Hiatus hernia syndrome    History reviewed. No pertinent past surgical history. No family history on file. Social History  Substance Use Topics  . Smoking status: None  . Smokeless tobacco: None  . Alcohol Use: No    Review of Systems  Constitutional: Positive for activity change. Negative for fever.  HENT: Positive for rhinorrhea. Negative for congestion, ear pain, postnasal drip, sinus pressure, sneezing, sore throat, tinnitus and trouble swallowing.   Eyes: Negative for photophobia, pain and visual disturbance.  Respiratory: Positive for cough. Negative for shortness of breath and wheezing.   Cardiovascular: Negative for chest pain and palpitations.  Gastrointestinal: Negative for nausea, vomiting  and abdominal pain.  Genitourinary: Negative for dysuria.  Skin: Positive for color change.  Psychiatric/Behavioral: Negative.     Allergies  Penicillins  Home Medications   Prior to Admission medications   Medication Sig Start Date End Date Taking? Authorizing Provider  Calcium Carbonate (CALTRATE 600 PO) Take by mouth.   Yes Historical Provider, MD  Multiple Vitamins-Minerals (CENTRUM SILVER PO) Take by mouth.   Yes Historical Provider, MD  omeprazole (PRILOSEC) 20 MG capsule Take 20 mg by mouth daily.   Yes Historical Provider, MD  rosuvastatin (CRESTOR) 10 MG tablet Take 10 mg by mouth daily.   Yes Historical Provider, MD  aspirin 81 MG tablet Take 81 mg by mouth daily.    Historical Provider, MD  calcitonin, salmon, (MIACALCIN/FORTICAL) 200 UNIT/ACT nasal spray Place 1 spray into alternate nostrils daily.    Historical Provider, MD  furosemide (LASIX) 20 MG tablet Take 1 tablet (20 mg total) by mouth daily. 01/25/15   Fransico Meadow, PA-C  hydrochlorothiazide (MICROZIDE) 12.5 MG capsule Take 12.5 mg by mouth daily.    Historical Provider, MD  OxyCODONE HCl ER (OXYCONTIN) 30 MG T12A Take by mouth.    Historical Provider, MD  polyethylene glycol (MIRALAX / GLYCOLAX) packet Take 17 g by mouth daily.    Historical Provider, MD  ramipril (ALTACE) 2.5 MG capsule Take 2.5 mg by mouth daily.    Historical Provider, MD  sertraline (ZOLOFT) 50 MG tablet Take 50 mg by mouth daily.    Historical Provider, MD  solifenacin (  VESICARE) 10 MG tablet Take by mouth daily.    Historical Provider, MD  zoledronic acid (RECLAST) 5 MG/100ML SOLN injection Inject 5 mg into the vein once.    Historical Provider, MD   Meds Ordered and Administered this Visit  Medications - No data to display  BP 118/70 mmHg  Pulse 98  Temp(Src) 97.3 F (36.3 C) (Oral)  SpO2 99% No data found.   Physical Exam  Constitutional: He appears well-developed and well-nourished. No distress.  HENT:  Mouth/Throat:  Oropharynx is clear and moist. No oropharyngeal exudate.  Bilateral TMs are normal  Eyes: EOM are normal. Pupils are equal, round, and reactive to light. Scleral icterus is present.  Neck: Normal range of motion. Neck supple.  Cardiovascular: Normal rate, regular rhythm and normal heart sounds.   Pulmonary/Chest: Effort normal. No respiratory distress.  Rhonchi and/or pleural friction rub in the left lateral chest. No crackles. No wheezes.  Abdominal: Soft. There is tenderness. There is no rebound.  Tenderness to the epigastrium and right upper quadrant.  Musculoskeletal: He exhibits edema.  Lymphadenopathy:    He has no cervical adenopathy.  Neurological: He is alert. He exhibits normal muscle tone.  Skin: Skin is warm and dry. No erythema.  Jaundiced  Psychiatric: He has a normal mood and affect.  Nursing note and vitals reviewed.   ED Course  Procedures (including critical care time)  Labs Review Labs Reviewed - No data to display  Imaging Review No results found.   Visual Acuity Review  Right Eye Distance:   Left Eye Distance:   Bilateral Distance:    Right Eye Near:   Left Eye Near:    Bilateral Near:         MDM   1. Jaundice   2. Myalgia   3. Rhinorrhea   4. RUQ abdominal tenderness   5. Rhonchi    Patient is being transferred by shuttle to the [facility] for evaluation of myalgias in the presence of acute jaundice, right upper quadrant tenderness, epigastric tenderness, left abnormal pulmonary sounds shuttle via.  Janne Napoleon, NP 09/01/15 1409

## 2015-09-01 NOTE — H&P (Signed)
[facility] History and Physical  [name] [name] [name] DOB: 02/08/1929 DOA: 09/01/2015  Referring physician: ED physician PCP: Limmie Patricia, MD   Chief Complaint: yellowing of skin  HPI:  [name] [name] is an 81yo man with PMH of HTN (he reports he does not have this despite medication), osteoporosis, macular degeneration ( recent diagnosis), CKD, HLD and h/o prostate cancer who presents for jaundice.  His family notes that they began noticing a yellowing of the skin a few days ago, but today it became extremely noticeable and he looked like "a pumpkin."  He also had yellowing of the eyes.  He has also noticed a very bright yellow color to his urine and itching of his skin.  He denies any abdominal symptoms including pain (though he notes that with palpation there is pain over his right quadrants), diarrhea, constipation, dysphagia, odynophagia, vomiting, nausea.  He has never had gallbladder disease, though his daughter did have her GB out.  He has no family history of GI cancers. He does have a personal history of prostate cancer and he had seeds placed in 2001.  He has recently started on 2 new medications, preservision vitamins from Dr. Zigmund [name] [place] for macular degeneration (started on 08/15/15) and chlorthalidone on or around 10/28.    In the ED, he was found to have a low Na to 119, Cr of 2.23 (last checked in April was 1.8), ALP of 844, Lipase of 111, AST of 459 and ALT of 499.  Tbili was 22.4.  Abdominal ultrasound is reported below.   Assessment and Plan: Transaminitis and jaundice - Pattern seems to be cholestatic with very high ALP - Abdominal ultrasound was abnormal and CBD was enlarged making GB disease seem likely - He reports no pain, but is pretty tender on my exam of the RUQ - Other DDx would be pancreatic Ca or cholangiocarcinoma - CT abdomen may be warranted after hydration and renal function improves - IVF with NS at 100cc/hr overnight - Recheck CMET in  the AM - Per pharmacy, both of his new meds can cause jaundice - preservision vitamins and chlorthalidone, these will be held - Consider GI consult in the AM  Hyponatremia - I think this may be pseudohyponatremia in the setting of cholestatic jaundice - Check urine osm, serum osm, urine Na, lipid profile - IVF as noted above, would stop fluids if any impressive shifts in Na occur - CMET in the AM  CKD (chronic kidney disease) - Cr up to 2.23 from 1.8 in April of this year - IVF with NS, check labs as noted  Essential hypertension - Hold chlorthalidone - Hold ramipril in setting of AKI - If becomes hypertensive, PRN hydralazine  H/O prostate Ca with Urinary incontinence - Continue myrbetriq    Macular degeneration - Hold vitamins    DVT PPx: heparin SQ  Diet: NPO p MN  Radiological Exams on Admission: Dg Chest 2 View  09/01/2015  CLINICAL DATA:  Occasional cough, jaundice. EXAM: CHEST  2 VIEW COMPARISON:  01/25/2015 FINDINGS: Lungs are adequately inflated with subtle opacification over the region of the right middle lobe/lingula on the lateral film as cannot exclude atelectasis or infection. No evidence of effusion. Cardiomediastinal silhouette is within normal. Several old right-sided rib fractures. Moderate costochondral calcification. Degenerative changes of the spine with stable moderate compression fracture over the upper thoracic spine. IMPRESSION: Subtle opacification over the region of the right middle lobe/ lingula on the lateral film as cannot exclude atelectasis or early  infection. Electronically Signed   By: Marin Olp M.D.   On: 09/01/2015 18:34   US Abdomen Complete  09/01/2015  CLINICAL DATA:  Jaundice EXAM: ULTRASOUND ABDOMEN COMPLETE COMPARISON:  02/04/2011 FINDINGS: Gallbladder: Abnormal gallbladder wall thickening at 4 mm. Internal sludge. No definite stone, but reduced negative predictive value due to difficulty repositioning the patient. Sonographic Murphy's  sign was absent. Common bile duct: Diameter: 9 mm were visualized ; the only a small segment could be seen due to poor sonic windows. Liver: Accentuated hepatic echogenicity. No focal lesion identified. IVC: No abnormality visualized. Pancreas: Not seen due to overlying bowel gas. Spleen: Size and appearance within normal limits. Right Kidney: Length: 9.4 cm. Echogenicity within normal limits. No mass or hydronephrosis visualized. Thin, atrophic appearance. Left Kidney: Length: 10.1 cm. Echogenicity within normal limits. No mass or hydronephrosis visualized. Within, atrophic appearance. Abdominal aorta: Not well seen due to overlying bowel gas. Other findings: None. IMPRESSION: 1. Abnormal gallbladder wall thickening with internal sludge. No definite gallstones although sensitivity is mildly reduced due to difficulty positioning the patient. Acalculous cholecystitis is not excluded. 2. Borderline dilated common bile duct at 9 mm, with only a small segment of the CBD visible due to poor sonic windows. 3. Accentuated hepatic echogenicity. The most common cause would be hepatic steatosis. No definite nodularity TS further suggest cirrhosis. Chronic hepatitis and hemochromatosis can also accentuate hepatic echogenicity. No focal liver lesion is identified. 4. Bilateral mild renal atrophy. 5. Nonvisualization of the pancreas and abdominal aorta due to bowel gas. Electronically Signed   By: Van Clines M.D.   On: 09/01/2015 19:33   Code Status: Full Family Communication: Pt and family at bedside Disposition Plan: Admit for further evaluation    Gilles Chiquito, MD [phone]   Review of Systems:  Constitutional: Negative for fever, chills and malaise/fatigue. Negative for diaphoresis.  HENT: + for chronic hearing loss, wears hearing aids.  Negative for ear pain, congestion, sore throat, Eyes: Negative for blurred vision, double vision, photophobia Respiratory: Negative for cough, shortness of breath    Cardiovascular: Negative for chest pain, leg swelling.  Gastrointestinal: + for abdominal pain Negative for nausea, vomiting, heartburn, constipation, blood in stool and melena.  Genitourinary: + for change in urine color Negative for dysuria, urgency, frequency Musculoskeletal: Negative for myalgias, back pain, joint pain and falls.  Skin: + for itching Negative for rash.  Neurological: Negative for dizziness and weakness.  Endo/Heme/Allergies:  Does not bruise/bleed easily.     Past Medical History  Diagnosis Date  . Hypertension     systolic  . Osteoporosis   . Edema of extremities   . Syncope 2012  . Dyslipidemia   . Chronic renal insufficiency   . Prostate cancer ([place])   . Dysphagia   . Colitis   . Microhematuria   . Pleural effusion   . ED (erectile dysfunction)   . Anemia   . Onychomycosis   . Inguinal hernia   . Shingles   . Afebrile   . Hiatus hernia syndrome     Past Surgical History  Procedure Laterality Date  . Transperineal implant of radiation seeds w/ ultrasound  2001    Social History:  reports that he quit smoking about 62 years ago. He has never used smokeless tobacco. He reports that he drinks about 8.4 oz of alcohol per week. He reports that he does not use illicit drugs.  Allergies  Allergen Reactions  . Oxycontin [Oxycodone Hcl] Other (See Comments)  ALTERED MENTAL STATUS, AGITATION, HYPERACTIVITY  . Penicillins Hives    Family History  Problem Relation Age of Onset  . Throat cancer Mother   . Lung cancer Brother   . Bone cancer Brother     Prior to Admission medications   Medication Sig Start Date End Date Taking? Authorizing Provider  acetaminophen (TYLENOL) 500 MG tablet Take 500 mg by mouth every 6 (six) hours as needed for moderate pain.   Yes Historical Provider, MD  Calcium Carbonate (CALTRATE 600 PO) Take 1 tablet by mouth daily.    Yes Historical Provider, MD  chlorthalidone (HYGROTON) 25 MG tablet Take 25 mg by mouth daily.  08/30/15  Yes Historical Provider, MD  Influenza vac split quadrivalent PF (FLUARIX) 0.5 ML injection Inject 0.5 mLs into the muscle once.   Yes Historical Provider, MD  LOTEMAX 0.5 % GEL Place 1 drop into the left eye daily. 08/24/15  Yes Historical Provider, MD  Multiple Vitamins-Minerals (CENTRUM SILVER PO) Take 1 tablet by mouth daily.    Yes Historical Provider, MD  Multiple Vitamins-Minerals (PRESERVISION AREDS) CAPS Take 1 capsule by mouth daily.   Yes Historical Provider, MD  MYRBETRIQ 50 MG TB24 tablet Take 50 mg by mouth daily. 08/24/15  Yes Historical Provider, MD  omeprazole (PRILOSEC) 40 MG capsule Take 40 mg by mouth daily. 08/24/15  Yes Historical Provider, MD  Polyethyl Glycol-Propyl Glycol (SYSTANE) 0.4-0.3 % SOLN Place 1 drop into both eyes 2 (two) times daily.   Yes Historical Provider, MD  ramipril (ALTACE) 2.5 MG capsule Take 2.5 mg by mouth daily.   Yes Historical Provider, MD  rosuvastatin (CRESTOR) 10 MG tablet Take 10 mg by mouth daily.   Yes Historical Provider, MD  furosemide (LASIX) 20 MG tablet Take 1 tablet (20 mg total) by mouth daily. 01/25/15   Fransico Meadow, PA-C  ibuprofen (ADVIL,MOTRIN) 200 MG tablet Take 400 mg by mouth every 6 (six) hours as needed for moderate pain.    Historical Provider, MD    Physical Exam: Filed Vitals:   09/01/15 2100 09/01/15 2115 09/01/15 2130 09/01/15 2154  BP: 126/58 120/81 125/60 140/65  Pulse: 75 83 77 84  Temp:      TempSrc:      Resp:    20  SpO2: 99% 100% 98% 99%    Physical Exam  Constitutional: Appears well-developed and well-nourished. No distress.  HENT: Normocephalic.Oropharynx is clear and moist. + yellowing under tongue Eyes: + sceral icterus and some conjunctival injection.  EOMI, PERRLA  Neck: Normal ROM. Neck supple.  CVS: RR, NR, S1/S2 +, no murmurs Pulmonary: Effort and breath sounds normal, no rhonchi, wheezes, rales.  Abdominal: Soft. BS +,  no distension, + tenderness on right side upper and lower,  most tender over liver.  Musculoskeletal:  No edema and no tenderness.  Lymphadenopathy: No lymphadenopathy noted, cervical Neuro: Alert. Normal muscle tone.  Skin: Skin is warm and dry. + jaundice throughout Psychiatric: Normal mood and affect. Behavior, judgment, thought content normal.   Labs on Admission:  Basic Metabolic Panel:  Recent Labs Lab 09/01/15 1618  NA 119*  K 3.5  CL 84*  CO2 23  GLUCOSE 115*  BUN 28*  CREATININE 2.23*  CALCIUM 9.5   Liver Function Tests:  Recent Labs Lab 09/01/15 1618  AST 459*  ALT 499*  ALKPHOS 844*  BILITOT 22.4*  PROT 7.8  ALBUMIN 3.8    Recent Labs Lab 09/01/15 1618  LIPASE 111*   CBC:  Recent Labs  Lab 09/01/15 1618  WBC 6.6  HGB 10.0*  HCT 29.0*  MCV 97.3  PLT 224      If 7PM-7AM, please contact night-coverage [email or web address] Password [email or web address] 09/01/2015, 10:50 PM

## 2015-09-01 NOTE — ED Notes (Addendum)
Pt reports to the ED for eval of generalized weakness, cough, cold symptoms, body aches, and RUQ abdominal tenderness. Pt is also jaundice. Pt developed the cough/cold symptoms x 1 week. He has been taking motrin/tylenol for the aches. State she takes very little tylenol, it has mostly been motrin. Pt also reports dark urine. Denies any N/V/D. Pt A&Ox4, resp e/u, and skin warm and dry. Does drink ETOH q day, 0.5 bottle of wine estimated

## 2015-09-01 NOTE — ED Provider Notes (Signed)
CSN: [id number]     Arrival date & time 09/01/15  1437 History   First MD Initiated Contact with Patient 09/01/15 1727     Chief Complaint  Patient presents with  . Jaundice      HPI Pt reports to the ED for eval of generalized weakness, cough, cold symptoms, body aches, and RUQ abdominal tenderness. Pt is also jaundice. Pt developed the cough/cold symptoms x 1 week. He has been taking motrin/tylenol for the aches. State she takes very little tylenol, it has mostly been motrin. Pt also reports dark urine. Denies any N/V/D. Pt A&Ox4, resp e/u, and skin warm and dry. Does drink ETOH q day, 0.5 bottle of wine estimated.  Patient is denying right upper quadrant or epigastric pain. Past Medical History  Diagnosis Date  . Hypertension     systolic  . Osteoporosis   . Edema of extremities   . Syncope 2012  . Dyslipidemia   . Chronic renal insufficiency   . Prostate cancer ([place])   . Dysphagia   . Colitis   . Microhematuria   . Pleural effusion   . ED (erectile dysfunction)   . Anemia   . Onychomycosis   . Inguinal hernia   . Shingles   . Afebrile   . Hiatus hernia syndrome    Past Surgical History  Procedure Laterality Date  . Transperineal implant of radiation seeds w/ ultrasound  2001  . Eus Left 09/06/2015    Procedure: UPPER ENDOSCOPIC ULTRASOUND (EUS) LINEAR;  Surgeon: Arta Silence, MD;  Location: [facility];  Service: Endoscopy;  Laterality: Left;  . Ercp N/A 09/06/2015    Procedure: ENDOSCOPIC RETROGRADE CHOLANGIOPANCREATOGRAPHY (ERCP);  Surgeon: Arta Silence, MD;  Location: [facility];  Service: Endoscopy;  Laterality: N/A;   Family History  Problem Relation Age of Onset  . Throat cancer Mother   . Lung cancer Brother   . Bone cancer Brother    Social History  Substance Use Topics  . Smoking status: Former Smoker    Quit date: 10/07/1952  . Smokeless tobacco: Never Used  . Alcohol Use: 8.4 oz/week    14 Standard drinks or equivalent per week     Comment:  daily, wine/beer q day, 0.5 bottle wine/day    Review of Systems  All other systems reviewed and are negative.     Allergies  Penicillins and Oxycontin  Home Medications   Prior to Admission medications   Medication Sig Start Date End Date Taking? Authorizing Provider  acetaminophen (TYLENOL) 500 MG tablet Take 500 mg by mouth every 6 (six) hours as needed for moderate pain.   Yes Historical Provider, MD  Calcium Carbonate (CALTRATE 600 PO) Take 1 tablet by mouth daily.    Yes Historical Provider, MD  Multiple Vitamins-Minerals (CENTRUM SILVER PO) Take 1 tablet by mouth daily.    Yes Historical Provider, MD  Multiple Vitamins-Minerals (PRESERVISION AREDS) CAPS Take 1 capsule by mouth daily.   Yes Historical Provider, MD  MYRBETRIQ 50 MG TB24 tablet Take 50 mg by mouth daily. 08/24/15  Yes Historical Provider, MD  omeprazole (PRILOSEC) 40 MG capsule Take 40 mg by mouth daily. 08/24/15  Yes Historical Provider, MD  Polyethyl Glycol-Propyl Glycol (SYSTANE) 0.4-0.3 % SOLN Place 1 drop into both eyes 2 (two) times daily.   Yes Historical Provider, MD  cholestyramine Lucrezia Starch) 4 G packet Take 1 packet (4 g total) by mouth 2 (two) times daily. 09/04/15   Orson Eva, MD   BP 118/55 mmHg  Pulse  82  Temp(Src) 98.7 F (37.1 C) (Oral)  Resp 16  Ht 5\' 9"  (1.753 m)  Wt 172 lb 13.5 oz (78.4 kg)  BMI 25.51 kg/m2  SpO2 99% Physical Exam  Constitutional: He is oriented to person, place, and time. He appears well-developed and well-nourished. No distress.  HENT:  Head: Normocephalic and atraumatic.  Eyes: Pupils are equal, round, and reactive to light. Scleral icterus is present.  Neck: Normal range of motion.  Cardiovascular: Normal rate and intact distal pulses.   Pulmonary/Chest: No respiratory distress.  Abdominal: Normal appearance. He exhibits no distension.  Musculoskeletal: Normal range of motion.  Neurological: He is alert and oriented to person, place, and time. No cranial nerve  deficit.  Skin: Skin is warm and dry. No rash noted.  Significant jaundice noted  Psychiatric: He has a normal mood and affect. His behavior is normal.  Nursing note and vitals reviewed.   ED Course  Procedures (including critical care time) Labs Review Labs Reviewed  LIPASE, BLOOD - Abnormal; Notable for the following:    Lipase 111 (*)    All other components within normal limits  COMPREHENSIVE METABOLIC PANEL - Abnormal; Notable for the following:    Sodium 119 (*)    Chloride 84 (*)    Glucose, Bld 115 (*)    BUN 28 (*)    Creatinine, Ser 2.23 (*)    AST 459 (*)    ALT 499 (*)    Alkaline Phosphatase 844 (*)    Total Bilirubin 22.4 (*)    GFR calc non Af Amer 25 (*)    GFR calc Af Amer 29 (*)    All other components within normal limits  CBC - Abnormal; Notable for the following:    RBC 2.98 (*)    Hemoglobin 10.0 (*)    HCT 29.0 (*)    All other components within normal limits  URINALYSIS, ROUTINE W REFLEX MICROSCOPIC (NOT AT [facility]) - Abnormal; Notable for the following:    Color, Urine ORANGE (*)    APPearance CLOUDY (*)    Bilirubin Urine LARGE (*)    All other components within normal limits  CBC - Abnormal; Notable for the following:    RBC 2.66 (*)    Hemoglobin 9.0 (*)    HCT 25.7 (*)    All other components within normal limits  CREATININE, SERUM - Abnormal; Notable for the following:    Creatinine, Ser 2.22 (*)    GFR calc non Af Amer 26 (*)    GFR calc Af Amer 30 (*)    All other components within normal limits  COMPREHENSIVE METABOLIC PANEL - Abnormal; Notable for the following:    Sodium 125 (*)    Chloride 91 (*)    Glucose, Bld 109 (*)    BUN 28 (*)    Creatinine, Ser 2.15 (*)    Albumin 3.2 (*)    AST 435 (*)    ALT 451 (*)    Alkaline Phosphatase 740 (*)    Total Bilirubin 21.7 (*)    GFR calc non Af Amer 27 (*)    GFR calc Af Amer 31 (*)    All other components within normal limits  CBC - Abnormal; Notable for the following:    RBC  2.72 (*)    Hemoglobin 9.0 (*)    HCT 26.6 (*)    All other components within normal limits  LIPID PANEL - Abnormal; Notable for the following:    Cholesterol 365 (*)  Triglycerides 355 (*)    HDL 10 (*)    VLDL 71 (*)    LDL Cholesterol 284 (*)    All other components within normal limits  OSMOLALITY, URINE - Abnormal; Notable for the following:    Osmolality, Ur 200 (*)    All other components within normal limits  OSMOLALITY - Abnormal; Notable for the following:    Osmolality 272 (*)    All other components within normal limits  BASIC METABOLIC PANEL - Abnormal; Notable for the following:    Sodium 125 (*)    Chloride 92 (*)    CO2 21 (*)    Glucose, Bld 110 (*)    BUN 27 (*)    Creatinine, Ser 1.98 (*)    GFR calc non Af Amer 29 (*)    GFR calc Af Amer 34 (*)    All other components within normal limits  BASIC METABOLIC PANEL - Abnormal; Notable for the following:    Sodium 125 (*)    Potassium 3.2 (*)    Chloride 92 (*)    Glucose, Bld 122 (*)    BUN 27 (*)    Creatinine, Ser 2.12 (*)    GFR calc non Af Amer 27 (*)    GFR calc Af Amer 31 (*)    All other components within normal limits  IRON AND TIBC - Abnormal; Notable for the following:    Saturation Ratios 40 (*)    All other components within normal limits  GLUCOSE, CAPILLARY - Abnormal; Notable for the following:    Glucose-Capillary 105 (*)    All other components within normal limits  COMPREHENSIVE METABOLIC PANEL - Abnormal; Notable for the following:    Sodium 125 (*)    Potassium 3.2 (*)    Chloride 90 (*)    Glucose, Bld 102 (*)    BUN 27 (*)    Creatinine, Ser 2.35 (*)    Total Protein 6.3 (*)    Albumin 3.0 (*)    AST 411 (*)    ALT 440 (*)    Alkaline Phosphatase 698 (*)    Total Bilirubin 23.6 (*)    GFR calc non Af Amer 24 (*)    GFR calc Af Amer 28 (*)    All other components within normal limits  COMPREHENSIVE METABOLIC PANEL - Abnormal; Notable for the following:    Sodium 130 (*)     Chloride 98 (*)    Glucose, Bld 108 (*)    BUN 24 (*)    Creatinine, Ser 2.11 (*)    Total Protein 6.2 (*)    Albumin 2.8 (*)    AST 388 (*)    ALT 422 (*)    Alkaline Phosphatase 626 (*)    Total Bilirubin 24.0 (*)    GFR calc non Af Amer 27 (*)    GFR calc Af Amer 31 (*)    All other components within normal limits  CULTURE, BLOOD (ROUTINE X 2)  CULTURE, BLOOD (ROUTINE X 2)  PROTIME-INR  APTT  SODIUM, URINE, RANDOM  HEPATITIS B SURFACE ANTIGEN  HEPATITIS C ANTIBODY  MAGNESIUM  MAGNESIUM    Imaging Review No results found.    IMPRESSION: 1. Abnormal gallbladder wall thickening with internal sludge. No definite gallstones although sensitivity is mildly reduced due to difficulty positioning the patient. Acalculous cholecystitis is not excluded. 2. Borderline dilated common bile duct at 9 mm, with only a small segment of the CBD visible due to poor sonic  windows. 3. Accentuated hepatic echogenicity. The most common cause would be hepatic steatosis. No definite nodularity TS further suggest cirrhosis. Chronic hepatitis and hemochromatosis can also accentuate hepatic echogenicity. No focal liver lesion is identified. 4. Bilateral mild renal atrophy. 5. Nonvisualization of the pancreas and abdominal aorta due to bowel gas.  IMPRESSION: 1. Abnormal gallbladder wall thickening with internal sludge. No definite gallstones although sensitivity is mildly reduced due to difficulty positioning the patient. Acalculous cholecystitis is not excluded. 2. Borderline dilated common bile duct at 9 mm, with only a small segment of the CBD visible due to poor sonic windows. 3. Accentuated hepatic echogenicity. The most common cause would be hepatic steatosis. No definite nodularity TS further suggest cirrhosis. Chronic hepatitis and hemochromatosis can also accentuate hepatic echogenicity. No focal liver lesion is identified. 4. Bilateral mild renal atrophy. 5. Nonvisualization  of the pancreas and abdominal aorta due to bowel gas.  I have personally reviewed and evaluated these images and lab results as part of my medical decision-making.  Patient has recently been started on [place] and a medication for his macular degeneration which can cause abnormally elevated LFTs and bilirubin.  Will evaluate with abdominal ultrasound to see if there is any mechanical obstruction or other abnormalities.  MDM   Final diagnoses:  Jaundice        Leonard Schwartz, MD 09/13/15 0020

## 2015-09-01 NOTE — ED Notes (Signed)
Pt attempt to void but could not produce urine at this time.

## 2015-09-01 NOTE — ED Notes (Signed)
C/o cold sx onset 1 week; daughter reports she noticed her father very "jaundice like"  Sx also include BA, congestion, runny nose Reports a new diuretic medication but do not remember the name of it; also taken off potassium recently A&O x4... No acute distress; family at bedside

## 2015-09-02 ENCOUNTER — Inpatient Hospital Stay (HOSPITAL_COMMUNITY): Payer: Medicare Other

## 2015-09-02 ENCOUNTER — Encounter (HOSPITAL_COMMUNITY): Payer: Self-pay | Admitting: *Deleted

## 2015-09-02 DIAGNOSIS — R740 Nonspecific elevation of levels of transaminase and lactic acid dehydrogenase [LDH]: Secondary | ICD-10-CM

## 2015-09-02 DIAGNOSIS — N183 Chronic kidney disease, stage 3 unspecified: Secondary | ICD-10-CM

## 2015-09-02 DIAGNOSIS — E871 Hypo-osmolality and hyponatremia: Secondary | ICD-10-CM

## 2015-09-02 LAB — CREATININE, SERUM
Creatinine, Ser: 2.22 mg/dL — ABNORMAL HIGH (ref 0.61–1.24)
GFR calc non Af Amer: 26 mL/min — ABNORMAL LOW (ref >60)
GFR, EST AFRICAN AMERICAN: 30 mL/min — AB (ref >60)

## 2015-09-02 LAB — BASIC METABOLIC PANEL
ANION GAP: 12 (ref 5–15)
ANION GAP: 10 (ref 5–15)
BUN: 27 mg/dL — AB (ref 6–20)
BUN: 27 mg/dL — ABNORMAL HIGH (ref 6–20)
CALCIUM: 9.3 mg/dL (ref 8.9–10.3)
CALCIUM: 9.3 mg/dL (ref 8.9–10.3)
CO2: 21 mmol/L — AB (ref 22–32)
CO2: 23 mmol/L (ref 22–32)
Chloride: 92 mmol/L — ABNORMAL LOW (ref 101–111)
Chloride: 92 mmol/L — ABNORMAL LOW (ref 101–111)
Creatinine, Ser: 1.98 mg/dL — ABNORMAL HIGH (ref 0.61–1.24)
Creatinine, Ser: 2.12 mg/dL — ABNORMAL HIGH (ref 0.61–1.24)
GFR calc Af Amer: 34 mL/min — ABNORMAL LOW (ref >60)
GFR calc Af Amer: 31 mL/min — ABNORMAL LOW (ref >60)
GFR calc non Af Amer: 29 mL/min — ABNORMAL LOW (ref >60)
GFR, EST NON AFRICAN AMERICAN: 27 mL/min — AB (ref >60)
GLUCOSE: 110 mg/dL — AB (ref 65–99)
Glucose, Bld: 122 mg/dL — ABNORMAL HIGH (ref 65–99)
POTASSIUM: 3.2 mmol/L — AB (ref 3.5–5.1)
Potassium: 4.3 mmol/L (ref 3.5–5.1)
Sodium: 125 mmol/L — ABNORMAL LOW (ref 135–145)
Sodium: 125 mmol/L — ABNORMAL LOW (ref 135–145)

## 2015-09-02 LAB — CBC
HCT: 26.6 % — ABNORMAL LOW (ref 39.0–52.0)
HEMATOCRIT: 25.7 % — AB (ref 39.0–52.0)
Hemoglobin: 9 g/dL — ABNORMAL LOW (ref 13.0–17.0)
Hemoglobin: 9 g/dL — ABNORMAL LOW (ref 13.0–17.0)
MCH: 33.1 pg (ref 26.0–34.0)
MCH: 33.8 pg (ref 26.0–34.0)
MCHC: 33.8 g/dL (ref 30.0–36.0)
MCHC: 35 g/dL (ref 30.0–36.0)
MCV: 96.6 fL (ref 78.0–100.0)
MCV: 97.8 fL (ref 78.0–100.0)
PLATELETS: 207 10*3/uL (ref 150–400)
PLATELETS: 223 10*3/uL (ref 150–400)
RBC: 2.66 MIL/uL — AB (ref 4.22–5.81)
RBC: 2.72 MIL/uL — AB (ref 4.22–5.81)
RDW: 14.5 % (ref 11.5–15.5)
RDW: 14.6 % (ref 11.5–15.5)
WBC: 5.3 10*3/uL (ref 4.0–10.5)
WBC: 5.9 10*3/uL (ref 4.0–10.5)

## 2015-09-02 LAB — COMPREHENSIVE METABOLIC PANEL
ALBUMIN: 3.2 g/dL — AB (ref 3.5–5.0)
ALK PHOS: 740 U/L — AB (ref 38–126)
ALT: 451 U/L — AB (ref 17–63)
AST: 435 U/L — AB (ref 15–41)
Anion gap: 10 (ref 5–15)
BUN: 28 mg/dL — AB (ref 6–20)
CALCIUM: 9.3 mg/dL (ref 8.9–10.3)
CHLORIDE: 91 mmol/L — AB (ref 101–111)
CO2: 24 mmol/L (ref 22–32)
CREATININE: 2.15 mg/dL — AB (ref 0.61–1.24)
GFR calc non Af Amer: 27 mL/min — ABNORMAL LOW (ref >60)
GFR, EST AFRICAN AMERICAN: 31 mL/min — AB (ref >60)
GLUCOSE: 109 mg/dL — AB (ref 65–99)
Potassium: 3.7 mmol/L (ref 3.5–5.1)
SODIUM: 125 mmol/L — AB (ref 135–145)
Total Bilirubin: 21.7 mg/dL (ref 0.3–1.2)
Total Protein: 6.9 g/dL (ref 6.5–8.1)

## 2015-09-02 LAB — LIPID PANEL
CHOL/HDL RATIO: 36.5 ratio
CHOLESTEROL: 365 mg/dL — AB (ref 0–200)
HDL: 10 mg/dL — ABNORMAL LOW (ref >40)
LDL CALC: 284 mg/dL — AB (ref 0–99)
Triglycerides: 355 mg/dL — ABNORMAL HIGH (ref <150)
VLDL: 71 mg/dL — AB (ref 0–40)

## 2015-09-02 LAB — IRON AND TIBC
IRON: 144 ug/dL (ref 45–182)
SATURATION RATIOS: 40 % — AB (ref 17.9–39.5)
TIBC: 357 ug/dL (ref 250–450)
UIBC: 213 ug/dL

## 2015-09-02 LAB — GLUCOSE, CAPILLARY: Glucose-Capillary: 105 mg/dL — ABNORMAL HIGH (ref 65–99)

## 2015-09-02 LAB — SODIUM, URINE, RANDOM: SODIUM UR: 24 mmol/L

## 2015-09-02 LAB — APTT: aPTT: 29 seconds (ref 24–37)

## 2015-09-02 LAB — OSMOLALITY: OSMOLALITY: 272 mosm/kg — AB (ref 275–295)

## 2015-09-02 LAB — PROTIME-INR
INR: 1.08 (ref 0.00–1.49)
PROTHROMBIN TIME: 14.2 s (ref 11.6–15.2)

## 2015-09-02 LAB — OSMOLALITY, URINE: Osmolality, Ur: 200 mOsm/kg — ABNORMAL LOW (ref 300–900)

## 2015-09-02 MED ORDER — PANTOPRAZOLE SODIUM 40 MG PO TBEC
40.0000 mg | DELAYED_RELEASE_TABLET | Freq: Every day | ORAL | Status: DC
  Administered 2015-09-02 – 2015-09-04 (×3): 40 mg via ORAL
  Filled 2015-09-02 (×3): qty 1

## 2015-09-02 MED ORDER — SODIUM CHLORIDE 0.9 % IV SOLN
INTRAVENOUS | Status: AC
  Administered 2015-09-02: 21:00:00 via INTRAVENOUS

## 2015-09-02 MED ORDER — HEPARIN SODIUM (PORCINE) 5000 UNIT/ML IJ SOLN
5000.0000 [IU] | Freq: Three times a day (TID) | INTRAMUSCULAR | Status: DC
  Administered 2015-09-02 – 2015-09-04 (×6): 5000 [IU] via SUBCUTANEOUS
  Filled 2015-09-02 (×7): qty 1

## 2015-09-02 MED ORDER — CALCIUM CARBONATE 1250 (500 CA) MG PO TABS
1250.0000 mg | ORAL_TABLET | Freq: Every day | ORAL | Status: DC
  Administered 2015-09-02 – 2015-09-04 (×3): 1250 mg via ORAL
  Filled 2015-09-02 (×3): qty 1

## 2015-09-02 MED ORDER — ONDANSETRON HCL 4 MG/2ML IJ SOLN: 4.0000 mg | Freq: Four times a day (QID) | INTRAMUSCULAR | Status: DC | PRN

## 2015-09-02 MED ORDER — LOTEPREDNOL ETABONATE 0.5 % OP SUSP
1.0000 [drp] | Freq: Every day | OPHTHALMIC | Status: DC
  Administered 2015-09-02 – 2015-09-04 (×2): 1 [drp] via OPHTHALMIC
  Filled 2015-09-02: qty 5

## 2015-09-02 MED ORDER — ONDANSETRON HCL 4 MG PO TABS: 4.0000 mg | ORAL_TABLET | Freq: Four times a day (QID) | ORAL | Status: DC | PRN

## 2015-09-02 MED ORDER — RAMIPRIL 2.5 MG PO CAPS: 2.5000 mg | ORAL_CAPSULE | Freq: Every day | ORAL | Status: DC

## 2015-09-02 MED ORDER — IOHEXOL 300 MG/ML  SOLN
25.0000 mL | INTRAMUSCULAR | Status: AC
  Administered 2015-09-02 (×2): 25 mL via ORAL

## 2015-09-02 MED ORDER — POTASSIUM CHLORIDE CRYS ER 20 MEQ PO TBCR
40.0000 meq | EXTENDED_RELEASE_TABLET | Freq: Once | ORAL | Status: AC
  Administered 2015-09-02: 40 meq via ORAL
  Filled 2015-09-02: qty 2

## 2015-09-02 MED ORDER — POLYVINYL ALCOHOL 1.4 % OP SOLN
1.0000 [drp] | Freq: Two times a day (BID) | OPHTHALMIC | Status: DC
  Administered 2015-09-02 – 2015-09-04 (×6): 1 [drp] via OPHTHALMIC
  Filled 2015-09-02: qty 15

## 2015-09-02 MED ORDER — SENNOSIDES-DOCUSATE SODIUM 8.6-50 MG PO TABS
1.0000 | ORAL_TABLET | Freq: Every evening | ORAL | Status: DC | PRN
  Administered 2015-09-03: 1 via ORAL
  Filled 2015-09-02: qty 1

## 2015-09-02 MED ORDER — SODIUM CHLORIDE 0.9 % IV SOLN
INTRAVENOUS | Status: AC
  Administered 2015-09-02: 01:00:00 via INTRAVENOUS

## 2015-09-02 MED ORDER — MIRABEGRON ER 50 MG PO TB24
50.0000 mg | ORAL_TABLET | Freq: Every day | ORAL | Status: DC
  Administered 2015-09-02 – 2015-09-04 (×3): 50 mg via ORAL
  Filled 2015-09-02 (×3): qty 1

## 2015-09-02 NOTE — Progress Notes (Signed)
PROGRESS NOTE  [name] [name] [name] DOB: 1929/08/26 DOA: 09/01/2015 PCP: Limmie Patricia, MD  Brief History 81 year old male with a history of hypertension, prostate cancer, CKD stage III, hyperlipidemia presented with painless jaundice. The patient's wife had noted that he had been increasingly turning yellow over the past 3-4 days. The patient denies any fevers, chills, nausea, vomiting, diarrhea, abdominal pain. He started on 2 new medications within the past 2-3 weeks including chlorthalidone and an over-the-counter medication for his macular degeneration. Over the past 2-3 weeks, he has had poor oral intake, has not had any emesis, hematemesis, hematochezia, melena. Upon admission, the patient was noted to have elevated transaminases and bilirubin up to 22.4. Gastroenterology was consulted to assist with management. Assessment/Plan: Transaminasemia/hyperbilirubinemia -Although cholecystitis remains a consideration, I'm concerned about an obstructive cause for the patient's hyperbilirubinemia [facility] gastroenterology consulted -Defer the need for MRCP/ERCP to gastroenterology -09/01/2015 abdominal ultrasound--mildly thickened gallbladder wall with sludge, common bile duct 9 mm -Check viral hepatitis serologies -blood cultures x 2 -start empiric abx Hyponatremia -Serum osmolarity 272, urine osmolarity 200, urine sodium 24 -Partly to be able to the patient's cutdown done as well as decreased oral intake/poor solute intake -Continue intravenous fluids -Monitor serial BMP CKD stage III -Baseline creatinine appears to be 1.4-1.8 although he has not had any recent labs noted in the past year. -hold altace Essential Hypertension -Discontinue chlorthalidone in the setting of hypernatremia -Blood pressure remained stable Hyperlipidemia -hold crestor  Family Communication:   Wife and daughter updated at beside Disposition Plan:   Home when medically  stable       Procedures/Studies: Dg Chest 2 View  09/01/2015  CLINICAL DATA:  Occasional cough, jaundice. EXAM: CHEST  2 VIEW COMPARISON:  01/25/2015 FINDINGS: Lungs are adequately inflated with subtle opacification over the region of the right middle lobe/lingula on the lateral film as cannot exclude atelectasis or infection. No evidence of effusion. Cardiomediastinal silhouette is within normal. Several old right-sided rib fractures. Moderate costochondral calcification. Degenerative changes of the spine with stable moderate compression fracture over the upper thoracic spine. IMPRESSION: Subtle opacification over the region of the right middle lobe/ lingula on the lateral film as cannot exclude atelectasis or early infection. Electronically Signed   By: Marin Olp M.D.   On: 09/01/2015 18:34   US Abdomen Complete  09/01/2015  CLINICAL DATA:  Jaundice EXAM: ULTRASOUND ABDOMEN COMPLETE COMPARISON:  02/04/2011 FINDINGS: Gallbladder: Abnormal gallbladder wall thickening at 4 mm. Internal sludge. No definite stone, but reduced negative predictive value due to difficulty repositioning the patient. Sonographic Murphy's sign was absent. Common bile duct: Diameter: 9 mm were visualized ; the only a small segment could be seen due to poor sonic windows. Liver: Accentuated hepatic echogenicity. No focal lesion identified. IVC: No abnormality visualized. Pancreas: Not seen due to overlying bowel gas. Spleen: Size and appearance within normal limits. Right Kidney: Length: 9.4 cm. Echogenicity within normal limits. No mass or hydronephrosis visualized. Thin, atrophic appearance. Left Kidney: Length: 10.1 cm. Echogenicity within normal limits. No mass or hydronephrosis visualized. Within, atrophic appearance. Abdominal aorta: Not well seen due to overlying bowel gas. Other findings: None. IMPRESSION: 1. Abnormal gallbladder wall thickening with internal sludge. No definite gallstones although sensitivity is  mildly reduced due to difficulty positioning the patient. Acalculous cholecystitis is not excluded. 2. Borderline dilated common bile duct at 9 mm, with only a small segment of the CBD visible due to poor sonic windows. 3. Accentuated  hepatic echogenicity. The most common cause would be hepatic steatosis. No definite nodularity TS further suggest cirrhosis. Chronic hepatitis and hemochromatosis can also accentuate hepatic echogenicity. No focal liver lesion is identified. 4. Bilateral mild renal atrophy. 5. Nonvisualization of the pancreas and abdominal aorta due to bowel gas. Electronically Signed   By: Van Clines M.D.   On: 09/01/2015 19:33        Subjective: Patient denies fevers, chills, headache, chest pain, dyspnea, nausea, vomiting, diarrhea, abdominal pain, dysuria, hematuria   Objective: Filed Vitals:   09/01/15 2315 09/01/15 2348 09/01/15 2356 09/02/15 0516  BP: 120/54   128/54  Pulse: 70   113  Temp:  97.9 F (36.6 C)  97.5 F (36.4 C)  TempSrc:  Oral  Oral  Resp: 18   18  Height:   5\' 9"  (1.753 m)   Weight:   78.4 kg (172 lb 13.5 oz)   SpO2: 100%   100%    Intake/Output Summary (Last 24 hours) at 09/02/15 1027 Last data filed at 09/02/15 0904  Gross per 24 hour  Intake    780 ml  Output      0 ml  Net    780 ml   Weight change:  Exam:   General:  Pt is alert, follows commands appropriately, not in acute distress  HEENT: No icterus, No thrush, No neck mass, [place]/AT  Cardiovascular: RRR, S1/S2, no rubs, no gallops  Respiratory: Fine bibasilar crackles without wheezing.  Abdomen: Soft/+BS, non tender, non distended, no guarding  Extremities: No edema, No lymphangitis, No petechiae, No rashes, no synovitis  Data Reviewed: Basic Metabolic Panel:  Recent Labs Lab 09/01/15 1618 09/02/15 0039 09/02/15 0610  NA 119*  --  125*  K 3.5  --  3.7  CL 84*  --  91*  CO2 23  --  24  GLUCOSE 115*  --  109*  BUN 28*  --  28*  CREATININE 2.23* 2.22* 2.15*   CALCIUM 9.5  --  9.3   Liver Function Tests:  Recent Labs Lab 09/01/15 1618 09/02/15 0610  AST 459* 435*  ALT 499* 451*  ALKPHOS 844* 740*  BILITOT 22.4* 21.7*  PROT 7.8 6.9  ALBUMIN 3.8 3.2*    Recent Labs Lab 09/01/15 1618  LIPASE 111*   No results for input(s): AMMONIA in the last 168 hours. CBC:  Recent Labs Lab 09/01/15 1618 09/02/15 0039 09/02/15 0610  WBC 6.6 5.9 5.3  HGB 10.0* 9.0* 9.0*  HCT 29.0* 25.7* 26.6*  MCV 97.3 96.6 97.8  PLT 224 207 223   Cardiac Enzymes: No results for input(s): CKTOTAL, CKMB, CKMBINDEX, TROPONINI in the last 168 hours. BNP: Invalid input(s): POCBNP CBG: No results for input(s): GLUCAP in the last 168 hours.  No results found for this or any previous visit (from the past 240 hour(s)).   Scheduled Meds: . calcium carbonate  1,250 mg Oral Q breakfast  . heparin  5,000 Units Subcutaneous 3 times per day  . loteprednol  1 drop Left Eye Daily  . mirabegron ER  50 mg Oral Daily  . pantoprazole  40 mg Oral Daily  . polyvinyl alcohol  1 drop Both Eyes BID   Continuous Infusions:     Fadumo Heng, DO  [facility] Pager [phone]  If 7PM-7AM, please contact night-coverage [email or web address] Password [email or web address] 09/02/2015, 10:27 AM   LOS: 1 day

## 2015-09-02 NOTE — Progress Notes (Addendum)
CRITICAL VALUE ALERT  Critical value received:  Total Bilirubin  Date of notification:  0840  Time of notification:  0815  Critical value read back:Yes.    Nurse who received alert:  Fabian Sharp, RN  MD notified (1st page):  Tat  Time of first page:  0840  MD notified (2nd page):  Time of second page:  Responding MD:  TAT  Time MD responded:  [id number]

## 2015-09-02 NOTE — Consult Note (Signed)
Subjective:   HPI  The patient is a 81 year old male who we are asked to see in regards to new onset of jaundice. The family and patient noticed jaundice a few days ago. There is no abdominal pain. There is no nausea or vomiting. He drinks occasional wine. He was recently started on chlorthalidone. He has been on Crestor. He has no specific complaints other than the jaundice. Bilirubin noted to be around 22  Review of Systems Denies chest pain or shortness of breath  Past Medical History  Diagnosis Date  . Hypertension     systolic  . Osteoporosis   . Edema of extremities   . Syncope 2012  . Dyslipidemia   . Chronic renal insufficiency   . Prostate cancer ([place])   . Dysphagia   . Colitis   . Microhematuria   . Pleural effusion   . ED (erectile dysfunction)   . Anemia   . Onychomycosis   . Inguinal hernia   . Shingles   . Afebrile   . Hiatus hernia syndrome    Past Surgical History  Procedure Laterality Date  . Transperineal implant of radiation seeds w/ ultrasound  2001   Social History   Social History  . Marital Status: Married    Spouse Name: N/A  . Number of Children: N/A  . Years of Education: N/A   Occupational History  . Not on file.   Social History Main Topics  . Smoking status: Former Smoker    Quit date: 10/07/1952  . Smokeless tobacco: Never Used  . Alcohol Use: 8.4 oz/week    14 Standard drinks or equivalent per week     Comment: daily, wine/beer q day, 0.5 bottle wine/day  . Drug Use: No  . Sexual Activity: Not on file   Other Topics Concern  . Not on file   Social History Narrative   family history includes Bone cancer in his brother; Lung cancer in his brother; Throat cancer in his mother.  Current facility-administered medications:  .  calcium carbonate (OS-CAL - dosed in mg of elemental calcium) tablet 1,250 mg, 1,250 mg, Oral, Q breakfast, Sid Falcon, MD, 1,250 mg at 09/02/15 0751 .  heparin injection 5,000 Units, 5,000 Units,  Subcutaneous, 3 times per day, Sid Falcon, MD .  loteprednol (LOTEMAX) 0.5 % ophthalmic suspension 1 drop, 1 drop, Left Eye, Daily, Sid Falcon, MD, 1 drop at 09/02/15 0751 .  mirabegron ER (MYRBETRIQ) tablet 50 mg, 50 mg, Oral, Daily, Sid Falcon, MD, 50 mg at 09/02/15 1001 .  ondansetron (ZOFRAN) tablet 4 mg, 4 mg, Oral, Q6H PRN **OR** ondansetron (ZOFRAN) injection 4 mg, 4 mg, Intravenous, Q6H PRN, Sid Falcon, MD .  pantoprazole (PROTONIX) EC tablet 40 mg, 40 mg, Oral, Daily, Sid Falcon, MD, 40 mg at 09/02/15 0751 .  polyvinyl alcohol (LIQUIFILM TEARS) 1.4 % ophthalmic solution 1 drop, 1 drop, Both Eyes, BID, Sid Falcon, MD, 1 drop at 09/02/15 0752 .  senna-docusate (Senokot-S) tablet 1 tablet, 1 tablet, Oral, QHS PRN, Sid Falcon, MD Allergies  Allergen Reactions  . Oxycontin [Oxycodone Hcl] Other (See Comments)    ALTERED MENTAL STATUS, AGITATION, HYPERACTIVITY  . Penicillins Hives     Objective:     BP 128/54 mmHg  Pulse 113  Temp(Src) 97.5 F (36.4 C) (Oral)  Resp 18  Ht 5\' 9"  (1.753 m)  Wt 78.4 kg (172 lb 13.5 oz)  BMI 25.51 kg/m2  SpO2 100%  No acute  distress  Skin and sclerae are jaundiced  Heart regular rhythm no murmurs  Lungs clear  Abdomen: All sounds normal, soft, nontender no hepatosplenomegaly  Ultrasound shows common bile duct 9 mm    Laboratory No components found for: D1    Assessment:     Painless jaundice. This could be obstructive in nature and I think we need to get a CT scan. We would be concerned about the possibility of a tumor obstructing the common bile duct. If this is negative one must consider the possibility of drug-induced jaundice.      Plan:     Agree with holding Crestor and chlorthalidone. Agree with CT scan. I will go ahead and order this. I think he can eat. Lab Results  Component Value Date   HGB 9.0* 09/02/2015   HGB 9.0* 09/02/2015   HGB 10.0* 09/01/2015   HCT 26.6* 09/02/2015   HCT 25.7*  09/02/2015   HCT 29.0* 09/01/2015   ALKPHOS 740* 09/02/2015   ALKPHOS 844* 09/01/2015   ALKPHOS 56 02/06/2011   AST 435* 09/02/2015   AST 459* 09/01/2015   AST 23 02/06/2011   ALT 451* 09/02/2015   ALT 499* 09/01/2015   ALT 13 02/06/2011

## 2015-09-03 DIAGNOSIS — I10 Essential (primary) hypertension: Secondary | ICD-10-CM

## 2015-09-03 LAB — COMPREHENSIVE METABOLIC PANEL
ALK PHOS: 698 U/L — AB (ref 38–126)
ALT: 440 U/L — AB (ref 17–63)
AST: 411 U/L — AB (ref 15–41)
Albumin: 3 g/dL — ABNORMAL LOW (ref 3.5–5.0)
Anion gap: 13 (ref 5–15)
BUN: 27 mg/dL — AB (ref 6–20)
CHLORIDE: 90 mmol/L — AB (ref 101–111)
CO2: 22 mmol/L (ref 22–32)
CREATININE: 2.35 mg/dL — AB (ref 0.61–1.24)
Calcium: 9 mg/dL (ref 8.9–10.3)
GFR calc Af Amer: 28 mL/min — ABNORMAL LOW (ref >60)
GFR, EST NON AFRICAN AMERICAN: 24 mL/min — AB (ref >60)
Glucose, Bld: 102 mg/dL — ABNORMAL HIGH (ref 65–99)
Potassium: 3.2 mmol/L — ABNORMAL LOW (ref 3.5–5.1)
Sodium: 125 mmol/L — ABNORMAL LOW (ref 135–145)
Total Bilirubin: 23.6 mg/dL (ref 0.3–1.2)
Total Protein: 6.3 g/dL — ABNORMAL LOW (ref 6.5–8.1)

## 2015-09-03 LAB — HEPATITIS C ANTIBODY

## 2015-09-03 LAB — MAGNESIUM: MAGNESIUM: 2.1 mg/dL (ref 1.7–2.4)

## 2015-09-03 LAB — HEPATITIS B SURFACE ANTIGEN: Hepatitis B Surface Ag: NEGATIVE

## 2015-09-03 MED ORDER — CAMPHOR-MENTHOL 0.5-0.5 % EX LOTN
TOPICAL_LOTION | CUTANEOUS | Status: DC | PRN
  Administered 2015-09-03: 14:00:00 via TOPICAL
  Filled 2015-09-03: qty 222

## 2015-09-03 MED ORDER — POTASSIUM CHLORIDE CRYS ER 20 MEQ PO TBCR
40.0000 meq | EXTENDED_RELEASE_TABLET | Freq: Once | ORAL | Status: AC
  Administered 2015-09-03: 40 meq via ORAL
  Filled 2015-09-03: qty 2

## 2015-09-03 MED ORDER — SODIUM CHLORIDE 0.9 % IV SOLN
INTRAVENOUS | Status: DC
  Administered 2015-09-03 – 2015-09-04 (×2): via INTRAVENOUS
  Filled 2015-09-03 (×5): qty 1000

## 2015-09-03 MED ORDER — DIPHENHYDRAMINE HCL 25 MG PO CAPS
25.0000 mg | ORAL_CAPSULE | Freq: Once | ORAL | Status: AC
  Administered 2015-09-03: 25 mg via ORAL
  Filled 2015-09-03: qty 1

## 2015-09-03 NOTE — Progress Notes (Addendum)
PROGRESS NOTE  [name] [name] [id number] DOB: [birth year] DOA: 09/01/2015 PCP: Limmie Patricia, MD   Brief History 81 year old male with a history of hypertension, prostate cancer, CKD stage III, hyperlipidemia presented with painless jaundice. The patient's wife had noted that he had been increasingly turning yellow over the past 3-4 days. The patient denies any fevers, chills, nausea, vomiting, diarrhea, abdominal pain. He started on 2 new medications within the past 2-3 weeks including chlorthalidone and an over-the-counter medication for his macular degeneration. Over the past 2-3 weeks, he has had poor oral intake, has not had any emesis, hematemesis, hematochezia, melena. Upon admission, the patient was noted to have elevated transaminases and bilirubin up to 22.4. Gastroenterology was consulted to assist with management. Assessment/Plan: Transaminasemia/hyperbilirubinemia -Although cholecystitis remains a consideration, I'm concerned about an obstructive cause for the patient's hyperbilirubinemia -09/01/2015 abdominal ultrasound--mildly thickened gallbladder wall with sludge, common bile duct 9 mm -09/02/2015 CT abdomen and pelvis-Diffuse ductal dilatation up to 13 mm with ill-defined soft tissue prominence at the pancreatic head -Appreciate GI follow-up--plans noted for possible ERCP/EUS -Hepatitis B,C serologies--neg -blood cultures x 2 Hyponatremia -Serum osmolarity 272, urine osmolarity 200, urine sodium 24 -Due to the patient's medications including chlorthalidone, ramipril, furosemide, and volume depletion as well as decreased oral intake/poor solute intake -Restart intravenous fluids -Monitor serial BMP CKD stage III -Baseline creatinine appears to be 1.4-1.8 although he has not had any recent labs noted in the past year. -hold altace Essential Hypertension -Discontinue lasix and chlorthalidone in the setting of hyponatremia -Blood pressure remained  stable Hyperlipidemia -hold crestor -LDL 284 Pruritis -Secondary to hyperbilirubinemia -topical Sarna Hypokalemia -Replete -Check magnesium  Family Communication: Wife and daughter updated at beside 11/27 Disposition Plan: Home when medically stable     Procedures/Studies: Ct Abdomen Pelvis Wo Contrast  09/02/2015  CLINICAL DATA:  Jaundice. Renal insufficiency. Personal history of prostate carcinoma. EXAM: CT ABDOMEN AND PELVIS WITHOUT CONTRAST TECHNIQUE: Multidetector CT imaging of the abdomen and pelvis was performed following the standard protocol without IV contrast. COMPARISON:  09/13/2005 FINDINGS: Lower chest:  No acute findings. Hepatobiliary: No liver mass visualized on this un-enhanced exam. Diffuse biliary ductal dilatation is new since previous study with common bile duct measuring approximately 13 mm. Gallbladder is distended, however there is no evidence of gallbladder wall thickening or pericholecystic inflammatory changes. Pancreas: Ill-defined soft tissue prominence in the pancreatic head measures 3.0 x 2.3 cm on image 33/series 201. This appears increased since previous study no suspicious for pancreatic head mass. No evidence of pancreatic ductal dilatation or peripancreatic inflammatory changes. Spleen: Within normal limits in size. Adrenals/Urinary Tract: No evidence of urolithiasis or hydronephrosis. No definite mass visualized on this un-enhanced exam. Stomach/Bowel: Tiny hiatal hernia again noted. No evidence of obstruction, inflammatory process, or abnormal fluid collections. Vascular/Lymphatic: No pathologically enlarged lymph nodes. No evidence of abdominal aortic aneurysm. Reproductive: Brachytherapy seeds seen throughout the prostate bed. Mild diffuse bladder wall thickening again seen, likely due to radiation cystitis. Other: Small left inguinal hernia again seen containing only fat. Musculoskeletal:  No suspicious bone lesions identified. IMPRESSION: New diffuse  biliary ductal dilatation. Although pancreas is difficult to evaluate on this unenhanced exam, increased soft tissue prominence noted in the pancreatic head is suspicious for pancreatic carcinoma. Consider endoscopic ultrasound or abdomen MRCP without and with contrast for further evaluation. No evidence of abdominal or pelvic metastatic disease. 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 Electronically Signed  By: Earle Gell M.D.   On: 09/02/2015 20:38   Dg Chest 2 View  09/01/2015  CLINICAL DATA:  Occasional cough, jaundice. EXAM: CHEST  2 VIEW COMPARISON:  01/25/2015 FINDINGS: Lungs are adequately inflated with subtle opacification over the region of the right middle lobe/lingula on the lateral film as cannot exclude atelectasis or infection. No evidence of effusion. Cardiomediastinal silhouette is within normal. Several old right-sided rib fractures. Moderate costochondral calcification. Degenerative changes of the spine with stable moderate compression fracture over the upper thoracic spine. IMPRESSION: Subtle opacification over the region of the right middle lobe/ lingula on the lateral film as cannot exclude atelectasis or early infection. Electronically Signed   By: Marin Olp M.D.   On: 09/01/2015 18:34   US Abdomen Complete  09/01/2015  CLINICAL DATA:  Jaundice EXAM: ULTRASOUND ABDOMEN COMPLETE COMPARISON:  02/04/2011 FINDINGS: Gallbladder: Abnormal gallbladder wall thickening at 4 mm. Internal sludge. No definite stone, but reduced negative predictive value due to difficulty repositioning the patient. Sonographic Murphy's sign was absent. Common bile duct: Diameter: 9 mm were visualized ; the only a small segment could be seen due to poor sonic windows. Liver: Accentuated hepatic echogenicity. No focal lesion identified. IVC: No abnormality visualized. Pancreas: Not seen due to overlying bowel gas. Spleen: Size and appearance within normal limits. Right  Kidney: Length: 9.4 cm. Echogenicity within normal limits. No mass or hydronephrosis visualized. Thin, atrophic appearance. Left Kidney: Length: 10.1 cm. Echogenicity within normal limits. No mass or hydronephrosis visualized. Within, atrophic appearance. Abdominal aorta: Not well seen due to overlying bowel gas. Other findings: None. IMPRESSION: 1. Abnormal gallbladder wall thickening with internal sludge. No definite gallstones although sensitivity is mildly reduced due to difficulty positioning the patient. Acalculous cholecystitis is not excluded. 2. Borderline dilated common bile duct at 9 mm, with only a small segment of the CBD visible due to poor sonic windows. 3. Accentuated hepatic echogenicity. The most common cause would be hepatic steatosis. No definite nodularity TS further suggest cirrhosis. Chronic hepatitis and hemochromatosis can also accentuate hepatic echogenicity. No focal liver lesion is identified. 4. Bilateral mild renal atrophy. 5. Nonvisualization of the pancreas and abdominal aorta due to bowel gas. Electronically Signed   By: Van Clines M.D.   On: 09/01/2015 19:33         Subjective: Patient was confused overnight. He is more somnolent today but able to answer questions properly. Denies any fevers, chills, chest discomfort, short of breath, nausea, vomiting, diarrhea, abdominal pain. Complains constipation. No dysuria or hematuria.   Objective: Filed Vitals:   09/01/15 2356 09/02/15 0516 09/02/15 1336 09/02/15 2125  BP:  128/54 113/59 118/57  Pulse:  113 84 84  Temp:  97.5 F (36.4 C) 98.4 F (36.9 C) 98 F (36.7 C)  TempSrc:  Oral Oral Oral  Resp:  18 18   Height: 5\' 9"  (1.753 m)     Weight: 78.4 kg (172 lb 13.5 oz)     SpO2:  100% 99% 99%    Intake/Output Summary (Last 24 hours) at 09/03/15 1323 Last data filed at 09/03/15 0900  Gross per 24 hour  Intake 1498.33 ml  Output    325 ml  Net 1173.33 ml   Weight change:  Exam:   General:  Pt is  alert, follows commands appropriately, not in acute distress  HEENT:+ icterus, No thrush, No neck mass, [place]/AT  Cardiovascular: RRR, S1/S2, no rubs, no gallops  Respiratory: CTA bilaterally, no wheezing, no crackles, no rhonchi  Abdomen: Soft/+BS, non tender, non distended, no guarding; no hepatosplenomegaly   Extremities: No edema, No lymphangitis, No petechiae, No rashes, no synovitis; no cyanosis or clubbing ; +jaundice  Data Reviewed: Basic Metabolic Panel:  Recent Labs Lab 09/01/15 1618 09/02/15 0039 09/02/15 0610 09/02/15 1025 09/02/15 1550 09/03/15 0400  NA 119*  --  125* 125* 125* 125*  K 3.5  --  3.7 4.3 3.2* 3.2*  CL 84*  --  91* 92* 92* 90*  CO2 23  --  24 21* 23 22  GLUCOSE 115*  --  109* 110* 122* 102*  BUN 28*  --  28* 27* 27* 27*  CREATININE 2.23* 2.22* 2.15* 1.98* 2.12* 2.35*  CALCIUM 9.5  --  9.3 9.3 9.3 9.0  MG  --   --   --   --   --  2.1   Liver Function Tests:  Recent Labs Lab 09/01/15 1618 09/02/15 0610 09/03/15 0400  AST 459* 435* 411*  ALT 499* 451* 440*  ALKPHOS 844* 740* 698*  BILITOT 22.4* 21.7* 23.6*  PROT 7.8 6.9 6.3*  ALBUMIN 3.8 3.2* 3.0*    Recent Labs Lab 09/01/15 1618  LIPASE 111*   No results for input(s): AMMONIA in the last 168 hours. CBC:  Recent Labs Lab 09/01/15 1618 09/02/15 0039 09/02/15 0610  WBC 6.6 5.9 5.3  HGB 10.0* 9.0* 9.0*  HCT 29.0* 25.7* 26.6*  MCV 97.3 96.6 97.8  PLT 224 207 223   Cardiac Enzymes: No results for input(s): CKTOTAL, CKMB, CKMBINDEX, TROPONINI in the last 168 hours. BNP: Invalid input(s): POCBNP CBG:  Recent Labs Lab 09/02/15 1200  GLUCAP 105*    Recent Results (from the past 240 hour(s))  Culture, blood (routine x 2)     Status: None (Preliminary result)   Collection Time: 09/02/15 11:30 AM  Result Value Ref Range Status   Specimen Description BLOOD LEFT ANTECUBITAL  Final   Special Requests BOTTLES DRAWN AEROBIC AND ANAEROBIC 5CC  Final   Culture NO GROWTH < 24 HOURS   Final   Report Status PENDING  Incomplete  Culture, blood (routine x 2)     Status: None (Preliminary result)   Collection Time: 09/02/15 11:35 AM  Result Value Ref Range Status   Specimen Description BLOOD RIGHT ANTECUBITAL  Final   Special Requests IN PEDIATRIC BOTTLE 3CC  Final   Culture NO GROWTH < 24 HOURS  Final   Report Status PENDING  Incomplete     Scheduled Meds: . calcium carbonate  1,250 mg Oral Q breakfast  . heparin  5,000 Units Subcutaneous 3 times per day  . loteprednol  1 drop Left Eye Daily  . mirabegron ER  50 mg Oral Daily  . pantoprazole  40 mg Oral Daily  . polyvinyl alcohol  1 drop Both Eyes BID   Continuous Infusions:    Itzel Lowrimore, DO  [facility] Pager [phone]  If 7PM-7AM, please contact night-coverage [email or web address] Password [email or web address] 09/03/2015, 1:23 PM   LOS: 2 days

## 2015-09-03 NOTE — Progress Notes (Signed)
Patient has dry itchy skin.  Requesting lotion or benadryl for itching. Paged DR Tat for orders

## 2015-09-03 NOTE — Progress Notes (Signed)
Utilization review completed.  

## 2015-09-03 NOTE — Progress Notes (Signed)
Results of CT scan noted. There is concern for a pancreatic tumor causing biliary obstruction. We did the CT scan without contrast because of his kidney function. I would not do an MRI with and without contrast because of the potential for worsening renal function if contrast is used. I will discuss with Dr. Paulita Fujita tomorrow about endoscopic ultrasound/ERCP with biliary stent placement. Findings today were explained to the patient and his family.

## 2015-09-04 LAB — COMPREHENSIVE METABOLIC PANEL
ALBUMIN: 2.8 g/dL — AB (ref 3.5–5.0)
ALK PHOS: 626 U/L — AB (ref 38–126)
ALT: 422 U/L — AB (ref 17–63)
AST: 388 U/L — AB (ref 15–41)
Anion gap: 9 (ref 5–15)
BILIRUBIN TOTAL: 24 mg/dL — AB (ref 0.3–1.2)
BUN: 24 mg/dL — AB (ref 6–20)
CO2: 23 mmol/L (ref 22–32)
Calcium: 9 mg/dL (ref 8.9–10.3)
Chloride: 98 mmol/L — ABNORMAL LOW (ref 101–111)
Creatinine, Ser: 2.11 mg/dL — ABNORMAL HIGH (ref 0.61–1.24)
GFR calc Af Amer: 31 mL/min — ABNORMAL LOW (ref >60)
GFR calc non Af Amer: 27 mL/min — ABNORMAL LOW (ref >60)
GLUCOSE: 108 mg/dL — AB (ref 65–99)
POTASSIUM: 3.9 mmol/L (ref 3.5–5.1)
Sodium: 130 mmol/L — ABNORMAL LOW (ref 135–145)
TOTAL PROTEIN: 6.2 g/dL — AB (ref 6.5–8.1)

## 2015-09-04 LAB — MAGNESIUM: Magnesium: 2 mg/dL (ref 1.7–2.4)

## 2015-09-04 MED ORDER — CHOLESTYRAMINE 4 G PO PACK: 4.0000 g | PACK | Freq: Two times a day (BID) | ORAL | Status: DC

## 2015-09-04 NOTE — Discharge Summary (Addendum)
Physician Discharge Summary  [name] [name] [id number] DOB: 08/03/1929 DOA: 09/01/2015  PCP: Limmie Patricia, MD  Admit date: 09/01/2015 Discharge date: 09/04/2015  Recommendations for Outpatient Follow-up:  1. Pt will need to follow up with PCP in 1-2 weeks post discharge 2. Please obtain BMP and LFTs in one week  Discharge Diagnoses:  Transaminasemia/hyperbilirubinemia -Although cholecystitis remains a consideration, I'm concerned about an obstructive cause for the patient's hyperbilirubinemia -09/01/2015 abdominal ultrasound--mildly thickened gallbladder wall with sludge, common bile duct 9 mm -09/02/2015 CT abdomen and pelvis-Diffuse ductal dilatation up to 13 mm with ill-defined soft tissue prominence at the pancreatic head -Appreciate GI follow-up--plans noted for possible ERCP/EUS -case was discussed with GI, Dr. Paulita Fujita-- procedure cannot be scheduled until 09/06/2015. He felt that the patient was medically stable from a GI standpoint to discharge if there was no other medical issues keeping him in the hospital.  Patient will return on 09/06/2015 as an outpatient for the procedure to be performed. -Hepatitis B,C serologies--neg -blood cultures x 2-- negative to date -Dr. Paulita Fujita requested Questran 2 grams bid for pt's itching -d/c Crestor Hyponatremia -Serum osmolarity 272, urine osmolarity 200, urine sodium 24 -Due to the patient's medications including chlorthalidone, ramipril, furosemide, and volume depletion as well as decreased oral intake/poor solute intake -Restart intravenous fluids--sodium improved to 130 on the day of discharge -Monitor serial BMP--recheck BMP in 1 week - Discussed with the patient importance of increasing fluid intake after discharge -the patient's chlorthalidone, ramipril, furosemide will not be restarted. - The patient's daughter states that he has not been taking the furosemide for the past month anyway. CKD stage III -Baseline creatinine  appears to be 1.4-1.8 although he has not had any recent labs noted in the past year. -hold altace--will not restart at the time of discharge - Serum creatinine 2.1 on the day of discharge Essential Hypertension -Discontinue lasix and chlorthalidone in the setting of hyponatremia -Blood pressure remained stable Hyperlipidemia -hold crestor -LDL 284 Pruritis -Secondary to hyperbilirubinemia -topical Sarna Hypokalemia -Repleted -Check magnesium--2.0  Discharge Condition: stable  Disposition: home  Diet:regular Wt Readings from Last 3 Encounters:  09/01/15 78.4 kg (172 lb 13.5 oz)  02/23/14 77.111 kg (170 lb)    History of present illness:  81 year old male with a history of hypertension, prostate cancer, CKD stage III, hyperlipidemia presented with painless jaundice. The patient's wife had noted that he had been increasingly turning yellow over the past 3-4 days. The patient denies any fevers, chills, nausea, vomiting, diarrhea, abdominal pain. He started on 2 new medications within the past 2-3 weeks including chlorthalidone and an over-the-counter medication for his macular degeneration. Over the past 2-3 weeks, he has had poor oral intake, has not had any emesis, hematemesis, hematochezia, melena. Upon admission, the patient was noted to have elevated transaminases and bilirubin up to 22.4. Gastroenterology was consulted to assist with management.CT of the abdomen and pelvis was performed and showed dilated biliary duct. There was a suggestion of a pancreatic head mass. Endoscopic ultrasound and ERCP are planned as an outpatient on 09/06/2015. The patient was medically stable for discharge. The patient was dehydrated with hyponatremia at the time of admission with a sodium of 119. With fluid resuscitation, the patient's serum sodium improved to 130.  Consultants: [facility]  Discharge Exam: Filed Vitals:   09/03/15 2212 09/04/15 1403  BP: 144/71 118/55  Pulse: 87 82  Temp:  98.7  F (37.1 C)  Resp: 20 16   Filed Vitals:   09/02/15 2125 09/03/15  1348 09/03/15 2212 09/04/15 1403  BP: 118/57 119/69 144/71 118/55  Pulse: 84 82 87 82  Temp: 98 F (36.7 C) 98.2 F (36.8 C)  98.7 F (37.1 C)  TempSrc: Oral Oral  Oral  Resp:  18 20 16   Height:      Weight:      SpO2: 99% 99% 100% 99%   General: A&O x 3, NAD, pleasant, cooperative Cardiovascular: RRR, no rub, no gallop, no S3 Respiratory: CTAB, no wheeze, no rhonchi Abdomen:soft, nontender, nondistended, positive bowel sounds Extremities: 1+LE edema, No lymphangitis, no petechiae  Discharge Instructions      Discharge Instructions    Diet general    Complete by:  As directed      Increase activity slowly    Complete by:  As directed             Medication List    STOP taking these medications        chlorthalidone 25 MG tablet  Commonly known as:  HYGROTON     furosemide 20 MG tablet  Commonly known as:  LASIX     ibuprofen 200 MG tablet  Commonly known as:  ADVIL,MOTRIN     Influenza vac split quadrivalent PF 0.5 ML injection  Commonly known as:  FLUARIX     ramipril 2.5 MG capsule  Commonly known as:  ALTACE     rosuvastatin 10 MG tablet  Commonly known as:  CRESTOR      TAKE these medications        acetaminophen 500 MG tablet  Commonly known as:  TYLENOL  Take 500 mg by mouth every 6 (six) hours as needed for moderate pain.     CALTRATE 600 PO  Take 1 tablet by mouth daily.     PRESERVISION AREDS Caps  Take 1 capsule by mouth daily.     CENTRUM SILVER PO  Take 1 tablet by mouth daily.     cholestyramine 4 G packet  Commonly known as:  QUESTRAN  Take 1 packet (4 g total) by mouth 2 (two) times daily.     LOTEMAX 0.5 % Gel  Generic drug:  Loteprednol Etabonate  Place 1 drop into the left eye daily.     MYRBETRIQ 50 MG Tb24 tablet  Generic drug:  mirabegron ER  Take 50 mg by mouth daily.     omeprazole 40 MG capsule  Commonly known as:  PRILOSEC  Take 40 mg by  mouth daily.     SYSTANE 0.4-0.3 % Soln  Generic drug:  Polyethyl Glycol-Propyl Glycol  Place 1 drop into both eyes 2 (two) times daily.         The results of significant diagnostics from this hospitalization (including imaging, microbiology, ancillary and laboratory) are listed below for reference.    Significant Diagnostic Studies: Ct Abdomen Pelvis Wo Contrast  09/02/2015  CLINICAL DATA:  Jaundice. Renal insufficiency. Personal history of prostate carcinoma. EXAM: CT ABDOMEN AND PELVIS WITHOUT CONTRAST TECHNIQUE: Multidetector CT imaging of the abdomen and pelvis was performed following the standard protocol without IV contrast. COMPARISON:  09/13/2005 FINDINGS: Lower chest:  No acute findings. Hepatobiliary: No liver mass visualized on this un-enhanced exam. Diffuse biliary ductal dilatation is new since previous study with common bile duct measuring approximately 13 mm. Gallbladder is distended, however there is no evidence of gallbladder wall thickening or pericholecystic inflammatory changes. Pancreas: Ill-defined soft tissue prominence in the pancreatic head measures 3.0 x 2.3 cm on image 33/series 201. This appears  increased since previous study no suspicious for pancreatic head mass. No evidence of pancreatic ductal dilatation or peripancreatic inflammatory changes. Spleen: Within normal limits in size. Adrenals/Urinary Tract: No evidence of urolithiasis or hydronephrosis. No definite mass visualized on this un-enhanced exam. Stomach/Bowel: Tiny hiatal hernia again noted. No evidence of obstruction, inflammatory process, or abnormal fluid collections. Vascular/Lymphatic: No pathologically enlarged lymph nodes. No evidence of abdominal aortic aneurysm. Reproductive: Brachytherapy seeds seen throughout the prostate bed. Mild diffuse bladder wall thickening again seen, likely due to radiation cystitis. Other: Small left inguinal hernia again seen containing only fat. Musculoskeletal:  No  suspicious bone lesions identified. IMPRESSION: New diffuse biliary ductal dilatation. Although pancreas is difficult to evaluate on this unenhanced exam, increased soft tissue prominence noted in the pancreatic head is suspicious for pancreatic carcinoma. Consider endoscopic ultrasound or abdomen MRCP without and with contrast for further evaluation. No evidence of abdominal or pelvic metastatic disease. 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 Electronically Signed   By: Earle Gell M.D.   On: 09/02/2015 20:38   Dg Chest 2 View  09/01/2015  CLINICAL DATA:  Occasional cough, jaundice. EXAM: CHEST  2 VIEW COMPARISON:  01/25/2015 FINDINGS: Lungs are adequately inflated with subtle opacification over the region of the right middle lobe/lingula on the lateral film as cannot exclude atelectasis or infection. No evidence of effusion. Cardiomediastinal silhouette is within normal. Several old right-sided rib fractures. Moderate costochondral calcification. Degenerative changes of the spine with stable moderate compression fracture over the upper thoracic spine. IMPRESSION: Subtle opacification over the region of the right middle lobe/ lingula on the lateral film as cannot exclude atelectasis or early infection. Electronically Signed   By: Marin Olp M.D.   On: 09/01/2015 18:34   US Abdomen Complete  09/01/2015  CLINICAL DATA:  Jaundice EXAM: ULTRASOUND ABDOMEN COMPLETE COMPARISON:  02/04/2011 FINDINGS: Gallbladder: Abnormal gallbladder wall thickening at 4 mm. Internal sludge. No definite stone, but reduced negative predictive value due to difficulty repositioning the patient. Sonographic Murphy's sign was absent. Common bile duct: Diameter: 9 mm were visualized ; the only a small segment could be seen due to poor sonic windows. Liver: Accentuated hepatic echogenicity. No focal lesion identified. IVC: No abnormality visualized. Pancreas: Not seen due to overlying bowel  gas. Spleen: Size and appearance within normal limits. Right Kidney: Length: 9.4 cm. Echogenicity within normal limits. No mass or hydronephrosis visualized. Thin, atrophic appearance. Left Kidney: Length: 10.1 cm. Echogenicity within normal limits. No mass or hydronephrosis visualized. Within, atrophic appearance. Abdominal aorta: Not well seen due to overlying bowel gas. Other findings: None. IMPRESSION: 1. Abnormal gallbladder wall thickening with internal sludge. No definite gallstones although sensitivity is mildly reduced due to difficulty positioning the patient. Acalculous cholecystitis is not excluded. 2. Borderline dilated common bile duct at 9 mm, with only a small segment of the CBD visible due to poor sonic windows. 3. Accentuated hepatic echogenicity. The most common cause would be hepatic steatosis. No definite nodularity TS further suggest cirrhosis. Chronic hepatitis and hemochromatosis can also accentuate hepatic echogenicity. No focal liver lesion is identified. 4. Bilateral mild renal atrophy. 5. Nonvisualization of the pancreas and abdominal aorta due to bowel gas. Electronically Signed   By: Van Clines M.D.   On: 09/01/2015 19:33     Microbiology: Recent Results (from the past 240 hour(s))  Culture, blood (routine x 2)     Status: None (Preliminary result)   Collection Time: 09/02/15 11:30 AM  Result Value  Ref Range Status   Specimen Description BLOOD LEFT ANTECUBITAL  Final   Special Requests BOTTLES DRAWN AEROBIC AND ANAEROBIC 5CC  Final   Culture NO GROWTH 2 DAYS  Final   Report Status PENDING  Incomplete  Culture, blood (routine x 2)     Status: None (Preliminary result)   Collection Time: 09/02/15 11:35 AM  Result Value Ref Range Status   Specimen Description BLOOD RIGHT ANTECUBITAL  Final   Special Requests IN PEDIATRIC BOTTLE 3CC  Final   Culture NO GROWTH 2 DAYS  Final   Report Status PENDING  Incomplete     Labs: Basic Metabolic Panel:  Recent Labs Lab  09/02/15 0610 09/02/15 1025 09/02/15 1550 09/03/15 0400 09/04/15 0600  NA 125* 125* 125* 125* 130*  K 3.7 4.3 3.2* 3.2* 3.9  CL 91* 92* 92* 90* 98*  CO2 24 21* 23 22 23   GLUCOSE 109* 110* 122* 102* 108*  BUN 28* 27* 27* 27* 24*  CREATININE 2.15* 1.98* 2.12* 2.35* 2.11*  CALCIUM 9.3 9.3 9.3 9.0 9.0  MG  --   --   --  2.1 2.0   Liver Function Tests:  Recent Labs Lab 09/01/15 1618 09/02/15 0610 09/03/15 0400 09/04/15 0600  AST 459* 435* 411* 388*  ALT 499* 451* 440* 422*  ALKPHOS 844* 740* 698* 626*  BILITOT 22.4* 21.7* 23.6* 24.0*  PROT 7.8 6.9 6.3* 6.2*  ALBUMIN 3.8 3.2* 3.0* 2.8*    Recent Labs Lab 09/01/15 1618  LIPASE 111*   No results for input(s): AMMONIA in the last 168 hours. CBC:  Recent Labs Lab 09/01/15 1618 09/02/15 0039 09/02/15 0610  WBC 6.6 5.9 5.3  HGB 10.0* 9.0* 9.0*  HCT 29.0* 25.7* 26.6*  MCV 97.3 96.6 97.8  PLT 224 207 223   Cardiac Enzymes: No results for input(s): CKTOTAL, CKMB, CKMBINDEX, TROPONINI in the last 168 hours. BNP: Invalid input(s): POCBNP CBG:  Recent Labs Lab 09/02/15 1200  GLUCAP 105*    Time coordinating discharge:  Greater than 30 minutes  Signed:  Jolon Degante, DO [facility] Pager: [phone] 09/04/2015, 3:01 PM

## 2015-09-04 NOTE — Progress Notes (Signed)
Subjective: Pruritus. No abdominal pain.  Objective: Vital signs in last 24 hours: Temp:  [98.2 F (36.8 C)] 98.2 F (36.8 C) (11/27 1348) Pulse Rate:  [82-87] 87 (11/27 2212) Resp:  [18-20] 20 (11/27 2212) BP: (119-144)/(69-71) 144/71 mmHg (11/27 2212) SpO2:  [99 %-100 %] 100 % (11/27 2212) Weight change:  Last BM Date: 09/03/15  PE: GEN:  Younger-appearing than stated age, jaundiced ABD:  Soft, non-tender  Lab Results: CMP     Component Value Date/Time   NA 130* 09/04/2015 0600   K 3.9 09/04/2015 0600   CL 98* 09/04/2015 0600   CO2 23 09/04/2015 0600   GLUCOSE 108* 09/04/2015 0600   BUN 24* 09/04/2015 0600   CREATININE 2.11* 09/04/2015 0600   CALCIUM 9.0 09/04/2015 0600   PROT 6.2* 09/04/2015 0600   ALBUMIN 2.8* 09/04/2015 0600   AST 388* 09/04/2015 0600   ALT 422* 09/04/2015 0600   ALKPHOS 626* 09/04/2015 0600   BILITOT 24.0* 09/04/2015 0600   GFRNONAA 27* 09/04/2015 0600   GFRAA 31* 09/04/2015 0600   CBC    Component Value Date/Time   WBC 5.3 09/02/2015 0610   RBC 2.72* 09/02/2015 0610   HGB 9.0* 09/02/2015 0610   HCT 26.6* 09/02/2015 0610   PLT 223 09/02/2015 0610   MCV 97.8 09/02/2015 0610   MCH 33.1 09/02/2015 0610   MCHC 33.8 09/02/2015 0610   RDW 14.6 09/02/2015 0610   LYMPHSABS 1.8 02/03/2011 1657   MONOABS 0.5 02/03/2011 1657   EOSABS 0.1 02/03/2011 1657   BASOSABS 0.0 02/03/2011 1657   Assessment:  1.  Elevated LFTs. 2.  Bile duct dilatation. 3.  Suspected pancreatic head mass on (limited) non-contrasted CT scan.  Plan:  1.  I have reviewed the patient's labs and CT results in detail.  I have advised that patient has bile duct obstruction, likely from process in head of pancreas, which in turn is highly likely malignant in origin.   2.  Patient will be scheduled for endoscopic ultrasound (for possible biopsy and tissue diagnosis) and endoscopic retrograde cholangiopancreatography (for hopeful biliary wallstent placement).  Risks, benefits  and rationale of these procedures were explained to patient and family in detail.  Procedures will be done Wednesday November 30th at [facility] due to limitations of anesthesia availability tomorrow. 3.  I have discussed with Dr. Carles Collet and family.  There is no reason from GI perspective patient needs to stay hospitalized at this time.  Will plan on doing procedure as outpatient on Wednesday, so long as no other issues requiring inpatient stay are required at this time. 4.  Over 30 minutes spent on this patient's case, over 50% of which was spent in direct patient counseling and coordination-of-care.   Landry Dyke 09/04/2015, 12:54 PM   Pager [phone] If no answer or after 5 PM call [phone]

## 2015-09-04 NOTE — Care Management Note (Signed)
Case Management Note  Patient Details  Name: LANSING AGRAWAL MRN: [id number] Date of Birth: 12/28/1928  Subjective/Objective:   Patient is for dc today , no needs.                 Action/Plan:   Expected Discharge Date:  09/04/15               Expected Discharge Plan:  Home/Self Care  In-House Referral:     Discharge planning Services  CM Consult  Post Acute Care Choice:    Choice offered to:     DME Arranged:    DME Agency:     HH Arranged:    HH Agency:     Status of Service:  Completed, signed off  Medicare Important Message Given:    Date Medicare IM Given:    Medicare IM give by:    Date Additional Medicare IM Given:    Additional Medicare Important Message give by:     If discussed at [place] of Stay Meetings, dates discussed:    Additional Comments:  Zenon Mayo, RN 09/04/2015, 2:18 PM

## 2015-09-04 NOTE — Progress Notes (Signed)
Pharmacist Provided - Patient Medication Education Prior to Discharge   [name] [name] is an 81 y.o. male who presented to [facility] on 09/01/2015 with a chief complaint of  Chief Complaint  Patient presents with  . Jaundice     [x]  Patient will be discharged with 1 new medications []  Patient being discharged without any new medications  The following medications were discussed with the patient: chlorthalidone, furosemide, ibuprofen, ramipril, rosuvastatin, cholestyramine   Pain Control medications: []  Yes    [x]  No  Diabetes Medications: []  Yes    [x]  No  Heart Failure Medications: []  Yes    [x]  No  Anticoagulation Medications:  []  Yes    [x]  No  Antibiotics at discharge: []  Yes    [x]  No  Allergy Assessment Completed and Updated: [x]  Yes    []  No Identified Patient Allergies:  Allergies  Allergen Reactions  . Oxycontin [Oxycodone Hcl] Other (See Comments)    ALTERED MENTAL STATUS, AGITATION, HYPERACTIVITY  . Penicillins Hives     Medication Adherence Assessment: []  Excellent (no doses missed/week)      [x]  Good (1 dose missed/week)      []  Partial (2-3 doses missed/week)      []  Poor (>3 doses missed/week)  Barriers to Obtaining Medications: []  Yes [x]  No  [name] [name] expressed no concern with obtaining medications  Assessment: Reviewed patient's discharge medications and noticed multiple medications discontinued secondary to increased LFTs and jaundice at presentation. Rosuvastatin still present on discharge orders. Spoke with MD and was entered in error. Discussed with patient and family to stop taking meds listed on AVS as well as rosuvastatin per MD. Also discussed new cholestyramine Rx with patient and family including proper dosing and adverse effects. Answered all questions from patient and family.  Time spent preparing for discharge counseling: 25 min Time spent counseling patient: 15 min  Judieth Keens, PharmD 09/04/2015, 3:07 PM

## 2015-09-04 NOTE — Care Management Important Message (Signed)
Important Message  Patient Details  Name: [name] [name] MRN: [id number] Date of Birth: 1929/06/27   Medicare Important Message Given:  Yes    [name] May 09/04/2015, 3:38 PM

## 2015-09-05 ENCOUNTER — Encounter (HOSPITAL_COMMUNITY): Payer: Self-pay | Admitting: *Deleted

## 2015-09-06 ENCOUNTER — Ambulatory Visit (HOSPITAL_COMMUNITY): Payer: Medicare Other | Admitting: Anesthesiology

## 2015-09-06 ENCOUNTER — Ambulatory Visit (HOSPITAL_COMMUNITY): Payer: Medicare Other

## 2015-09-06 ENCOUNTER — Encounter (HOSPITAL_COMMUNITY)
Admission: RE | Disposition: A | Discharge status: Home / Self Care | Payer: Self-pay | Source: Ambulatory Visit | Attending: Gastroenterology

## 2015-09-06 ENCOUNTER — Ambulatory Visit (HOSPITAL_COMMUNITY)
Admission: RE | Admit: 2015-09-06 | Discharge: 2015-09-06 | Disposition: A | Discharge status: Home / Self Care | Payer: Medicare Other | Source: Ambulatory Visit | Attending: Gastroenterology | Admitting: Gastroenterology

## 2015-09-06 ENCOUNTER — Encounter (HOSPITAL_COMMUNITY): Payer: Self-pay

## 2015-09-06 DIAGNOSIS — K449 Diaphragmatic hernia without obstruction or gangrene: Secondary | ICD-10-CM | POA: Diagnosis not present

## 2015-09-06 DIAGNOSIS — E871 Hypo-osmolality and hyponatremia: Secondary | ICD-10-CM | POA: Diagnosis not present

## 2015-09-06 DIAGNOSIS — I129 Hypertensive chronic kidney disease with stage 1 through stage 4 chronic kidney disease, or unspecified chronic kidney disease: Secondary | ICD-10-CM | POA: Diagnosis not present

## 2015-09-06 DIAGNOSIS — Z87891 Personal history of nicotine dependence: Secondary | ICD-10-CM | POA: Diagnosis not present

## 2015-09-06 DIAGNOSIS — R945 Abnormal results of liver function studies: Secondary | ICD-10-CM | POA: Diagnosis not present

## 2015-09-06 DIAGNOSIS — K219 Gastro-esophageal reflux disease without esophagitis: Secondary | ICD-10-CM | POA: Insufficient documentation

## 2015-09-06 DIAGNOSIS — Z8546 Personal history of malignant neoplasm of prostate: Secondary | ICD-10-CM | POA: Diagnosis not present

## 2015-09-06 DIAGNOSIS — R740 Nonspecific elevation of levels of transaminase and lactic acid dehydrogenase [LDH]: Secondary | ICD-10-CM | POA: Insufficient documentation

## 2015-09-06 DIAGNOSIS — K828 Other specified diseases of gallbladder: Secondary | ICD-10-CM | POA: Insufficient documentation

## 2015-09-06 DIAGNOSIS — C61 Malignant neoplasm of prostate: Secondary | ICD-10-CM | POA: Diagnosis not present

## 2015-09-06 DIAGNOSIS — Z79899 Other long term (current) drug therapy: Secondary | ICD-10-CM | POA: Diagnosis not present

## 2015-09-06 DIAGNOSIS — R17 Unspecified jaundice: Secondary | ICD-10-CM | POA: Diagnosis present

## 2015-09-06 DIAGNOSIS — K861 Other chronic pancreatitis: Secondary | ICD-10-CM | POA: Diagnosis not present

## 2015-09-06 DIAGNOSIS — E785 Hyperlipidemia, unspecified: Secondary | ICD-10-CM | POA: Diagnosis not present

## 2015-09-06 DIAGNOSIS — H3530 Unspecified macular degeneration: Secondary | ICD-10-CM | POA: Insufficient documentation

## 2015-09-06 DIAGNOSIS — M1990 Unspecified osteoarthritis, unspecified site: Secondary | ICD-10-CM | POA: Diagnosis not present

## 2015-09-06 DIAGNOSIS — R932 Abnormal findings on diagnostic imaging of liver and biliary tract: Secondary | ICD-10-CM | POA: Diagnosis not present

## 2015-09-06 DIAGNOSIS — N189 Chronic kidney disease, unspecified: Secondary | ICD-10-CM | POA: Insufficient documentation

## 2015-09-06 DIAGNOSIS — K831 Obstruction of bile duct: Secondary | ICD-10-CM

## 2015-09-06 DIAGNOSIS — Z791 Long term (current) use of non-steroidal anti-inflammatories (NSAID): Secondary | ICD-10-CM | POA: Diagnosis not present

## 2015-09-06 DIAGNOSIS — K838 Other specified diseases of biliary tract: Secondary | ICD-10-CM | POA: Insufficient documentation

## 2015-09-06 HISTORY — PX: ERCP: SHX5425

## 2015-09-06 HISTORY — PX: EUS: SHX5427

## 2015-09-06 SURGERY — UPPER ENDOSCOPIC ULTRASOUND (EUS) LINEAR
Anesthesia: General

## 2015-09-06 MED ORDER — SODIUM CHLORIDE 0.9 % IV SOLN
INTRAVENOUS | Status: DC | PRN
  Administered 2015-09-06: 40 mL

## 2015-09-06 MED ORDER — CIPROFLOXACIN IN D5W 400 MG/200ML IV SOLN
400.0000 mg | Freq: Once | INTRAVENOUS | Status: AC
  Administered 2015-09-06: 400 mg via INTRAVENOUS

## 2015-09-06 MED ORDER — EPHEDRINE SULFATE 50 MG/ML IJ SOLN
INTRAMUSCULAR | Status: AC
  Filled 2015-09-06: qty 1

## 2015-09-06 MED ORDER — INDOMETHACIN 50 MG RE SUPP
RECTAL | Status: AC
  Filled 2015-09-06: qty 2

## 2015-09-06 MED ORDER — PHENYLEPHRINE 40 MCG/ML (10ML) SYRINGE FOR IV PUSH (FOR BLOOD PRESSURE SUPPORT)
PREFILLED_SYRINGE | INTRAVENOUS | Status: AC
  Filled 2015-09-06: qty 10

## 2015-09-06 MED ORDER — FENTANYL CITRATE (PF) 100 MCG/2ML IJ SOLN
INTRAMUSCULAR | Status: DC | PRN
  Administered 2015-09-06 (×4): 25 ug via INTRAVENOUS

## 2015-09-06 MED ORDER — SODIUM CHLORIDE 0.9 % IV SOLN: INTRAVENOUS | Status: DC

## 2015-09-06 MED ORDER — EPHEDRINE SULFATE 50 MG/ML IJ SOLN
INTRAMUSCULAR | Status: DC | PRN
  Administered 2015-09-06: 5 mg via INTRAVENOUS

## 2015-09-06 MED ORDER — SUCCINYLCHOLINE CHLORIDE 20 MG/ML IJ SOLN
INTRAMUSCULAR | Status: DC | PRN
  Administered 2015-09-06: 100 mg via INTRAVENOUS

## 2015-09-06 MED ORDER — LIDOCAINE HCL (CARDIAC) 20 MG/ML IV SOLN
INTRAVENOUS | Status: AC
  Filled 2015-09-06: qty 5

## 2015-09-06 MED ORDER — GLUCAGON HCL RDNA (DIAGNOSTIC) 1 MG IJ SOLR
INTRAMUSCULAR | Status: AC
  Filled 2015-09-06: qty 1

## 2015-09-06 MED ORDER — ONDANSETRON HCL 4 MG/2ML IJ SOLN
INTRAMUSCULAR | Status: DC | PRN
  Administered 2015-09-06: 4 mg via INTRAVENOUS

## 2015-09-06 MED ORDER — PHENYLEPHRINE HCL 10 MG/ML IJ SOLN
INTRAMUSCULAR | Status: DC | PRN
  Administered 2015-09-06 (×2): 80 ug via INTRAVENOUS
  Administered 2015-09-06: 40 ug via INTRAVENOUS
  Administered 2015-09-06: 80 ug via INTRAVENOUS
  Administered 2015-09-06: 40 ug via INTRAVENOUS

## 2015-09-06 MED ORDER — LACTATED RINGERS IV SOLN
INTRAVENOUS | Status: DC | PRN
  Administered 2015-09-06: 09:00:00 via INTRAVENOUS

## 2015-09-06 MED ORDER — GLUCAGON HCL RDNA (DIAGNOSTIC) 1 MG IJ SOLR
INTRAMUSCULAR | Status: DC | PRN
  Administered 2015-09-06: .5 mg via INTRAVENOUS

## 2015-09-06 MED ORDER — LIDOCAINE HCL (CARDIAC) 20 MG/ML IV SOLN
INTRAVENOUS | Status: DC | PRN
  Administered 2015-09-06: 40 mg via INTRAVENOUS

## 2015-09-06 MED ORDER — ONDANSETRON HCL 4 MG/2ML IJ SOLN
INTRAMUSCULAR | Status: AC
  Filled 2015-09-06: qty 2

## 2015-09-06 MED ORDER — PROPOFOL 500 MG/50ML IV EMUL
INTRAVENOUS | Status: DC | PRN
  Administered 2015-09-06 (×2): 20 mg via INTRAVENOUS
  Administered 2015-09-06: 100 mg via INTRAVENOUS

## 2015-09-06 MED ORDER — SODIUM CHLORIDE 0.9 % IJ SOLN
INTRAMUSCULAR | Status: AC
  Filled 2015-09-06: qty 10

## 2015-09-06 MED ORDER — FENTANYL CITRATE (PF) 100 MCG/2ML IJ SOLN
INTRAMUSCULAR | Status: AC
  Filled 2015-09-06: qty 2

## 2015-09-06 MED ORDER — PROPOFOL 10 MG/ML IV BOLUS
INTRAVENOUS | Status: AC
  Filled 2015-09-06: qty 20

## 2015-09-06 MED ORDER — CIPROFLOXACIN IN D5W 400 MG/200ML IV SOLN
INTRAVENOUS | Status: AC
  Filled 2015-09-06: qty 200

## 2015-09-06 NOTE — Discharge Instructions (Signed)
Endoscopic Retrograde Cholangiopancreatography (ERCP) °ERCP stands for endoscopic retrograde cholangiopancreatography. In this procedure, a thin, lighted tube (endoscope) is used. It is passed through the mouth and down the back of the throat into the upper part of the intestine, called the duodenum. A small, plastic tube (cannula) is then passed through the endoscope. It is directed into the bile duct or pancreatic duct. Dye is then injected through the tube. X-rays are taken to study the biliary and pancreatic passageways. This procedure is used to diagnosis many diseases of the pancreas, bile ducts, liver, and gallbladder. °LET YOUR CAREGIVER KNOW ABOUT:  °· Allergies to food or medicine.  °· Medicines taken, including vitamins, herbs, eyedrops, over-the-counter medicines, and creams.  °· Use of steroids (by mouth or creams).  °· Previous problems with anesthetics or numbing medicines.  °· History of bleeding problems or blood clots.  °· Previous surgery.  °· Other health problems, including diabetes and kidney problems.  °· Possibility of pregnancy, if this applies.  °· Any barium X-rays during the past week.  °BEFORE THE PROCEDURE  °· Do not eat or drink anything, including water, for at least 6 hours before the procedure.  °· Ask your caregiver whether you should stop taking certain medicines prior to your procedure.  °· Arrange for someone to drive you home. You will not be allowed to drive for several hours after the procedure.  °· Arrive at least 60 minutes before the procedure or as directed. This will give you time to check in and fill out any necessary paperwork.  °PROCEDURE  °· You will be given medicine through a vein (intravenously) to make you relaxed and sleepy.  °· You might have a breathing tube placed to give you medicine that makes you sleep (general anesthetic).  °· Your throat may be sprayed with medicine that numbs the area (local anesthetic).  °· You will lie on your left side. The endoscope  will be inserted through your mouth and into the duodenum. The tube will not interfere with your breathing. Gagging is prevented by the anesthesia.  °· While X-rays are being taken, you may be positioned on your stomach.  °During the ERCP, the person performing the procedure may identify a blockage or narrowed opening to the bile duct. A muscular portion of the main bile duct may be partially cut (sphincterotomy). A thin, plastic tube (stent) will be positioned inside your bile duct. This is to allow fluid secreted by the liver (bile) to flow more easily through the narrowed opening. °AFTER THE PROCEDURE  °· You will rest in bed until you are fully conscious.  °· When you first wake up, your throat may feel slightly sore.  °· You will not be allowed to eat or drink until numbness subsides.  °· Once you are able to drink, urinate, and sit on the edge of the bed without feeling sick to your stomach (nauseous) or dizzy, you may be allowed to go home.  °· Have a friend or family member with you for the first 24 hours after your procedure.  °SEEK MEDICAL CARE IF:  °· You have an oral temperature above 102° F (38.9° C).  °· You develop other signs of infection, including chills or feeling unwell.  °· You have abdominal pain.  °· You have questions or concerns.  °MAKE SURE YOU:  °· Understand these instructions.  °· Will watch your condition.  °· Will get help right away if you are not doing well or get worse.  °  Document Released: 06/18/2001 Document Revised: 06/05/2011 Document Reviewed: 01/09/2010 °ExitCare® Patient Information ©2012 [employer]. ° °

## 2015-09-06 NOTE — H&P (View-Only) (Signed)
[facility] History and Physical  [name] [name] [id number] DOB: [birth year] DOA: 09/01/2015  Referring physician: ED physician PCP: [name] Patricia, MD   Chief Complaint: yellowing of skin  HPI:  [name] [name] is an 81yo man with PMH of HTN (he reports he does not have this despite medication), osteoporosis, macular degeneration ( recent diagnosis), CKD, HLD and h/o prostate cancer who presents for jaundice.  His family notes that they began noticing a yellowing of the skin a few days ago, but today it became extremely noticeable and he looked like "a pumpkin."  He also had yellowing of the eyes.  He has also noticed a very bright yellow color to his urine and itching of his skin.  He denies any abdominal symptoms including pain (though he notes that with palpation there is pain over his right quadrants), diarrhea, constipation, dysphagia, odynophagia, vomiting, nausea.  He has never had gallbladder disease, though his daughter did have her GB out.  He has no family history of GI cancers. He does have a personal history of prostate cancer and he had seeds placed in 2001.  He has recently started on 2 new medications, preservision vitamins from [name] in [facility] for macular degeneration (started on 08/15/15) and chlorthalidone on or around 10/28.    In the ED, he was found to have a low Na to 119, Cr of 2.23 (last checked in April was 1.8), ALP of 844, Lipase of 111, AST of 459 and ALT of 499.  Tbili was 22.4.  Abdominal ultrasound is reported below.   Assessment and Plan: Transaminitis and jaundice - Pattern seems to be cholestatic with very high ALP - Abdominal ultrasound was abnormal and CBD was enlarged making GB disease seem likely - He reports no pain, but is pretty tender on my exam of the RUQ - Other DDx would be pancreatic Ca or cholangiocarcinoma - CT abdomen may be warranted after hydration and renal function improves - IVF with NS at 100cc/hr overnight - Recheck CMET in  the AM - Per pharmacy, both of his new meds can cause jaundice - preservision vitamins and chlorthalidone, these will be held - Consider GI consult in the AM  Hyponatremia - I think this may be pseudohyponatremia in the setting of cholestatic jaundice - Check urine osm, serum osm, urine Na, lipid profile - IVF as noted above, would stop fluids if any impressive shifts in Na occur - CMET in the AM  CKD (chronic kidney disease) - Cr up to 2.23 from 1.8 in April of this year - IVF with NS, check labs as noted  Essential hypertension - Hold chlorthalidone - Hold ramipril in setting of AKI - If becomes hypertensive, PRN hydralazine  H/O prostate Ca with Urinary incontinence - Continue myrbetriq    Macular degeneration - Hold vitamins    DVT PPx: heparin SQ  Diet: NPO p MN  Radiological Exams on Admission: Dg Chest 2 View  09/01/2015  CLINICAL DATA:  Occasional cough, jaundice. EXAM: CHEST  2 VIEW COMPARISON:  01/25/2015 FINDINGS: Lungs are adequately inflated with subtle opacification over the region of the right middle lobe/lingula on the lateral film as cannot exclude atelectasis or infection. No evidence of effusion. Cardiomediastinal silhouette is within normal. Several old right-sided rib fractures. Moderate costochondral calcification. Degenerative changes of the spine with stable moderate compression fracture over the upper thoracic spine. IMPRESSION: Subtle opacification over the region of the right middle lobe/ lingula on the lateral film as cannot exclude atelectasis or early  infection. Electronically Signed   By: [name] [name] M.D.   On: 09/01/2015 18:34   US Abdomen Complete  09/01/2015  CLINICAL DATA:  Jaundice EXAM: ULTRASOUND ABDOMEN COMPLETE COMPARISON:  02/04/2011 FINDINGS: Gallbladder: Abnormal gallbladder wall thickening at 4 mm. Internal sludge. No definite stone, but reduced negative predictive value due to difficulty repositioning the patient. Sonographic Murphy's  sign was absent. Common bile duct: Diameter: 9 mm were visualized ; the only a small segment could be seen due to poor sonic windows. Liver: Accentuated hepatic echogenicity. No focal lesion identified. IVC: No abnormality visualized. Pancreas: Not seen due to overlying bowel gas. Spleen: Size and appearance within normal limits. Right Kidney: Length: 9.4 cm. Echogenicity within normal limits. No mass or hydronephrosis visualized. Thin, atrophic appearance. Left Kidney: Length: 10.1 cm. Echogenicity within normal limits. No mass or hydronephrosis visualized. Within, atrophic appearance. Abdominal aorta: Not well seen due to overlying bowel gas. Other findings: None. IMPRESSION: 1. Abnormal gallbladder wall thickening with internal sludge. No definite gallstones although sensitivity is mildly reduced due to difficulty positioning the patient. Acalculous cholecystitis is not excluded. 2. Borderline dilated common bile duct at 9 mm, with only a small segment of the CBD visible due to poor sonic windows. 3. Accentuated hepatic echogenicity. The most common cause would be hepatic steatosis. No definite nodularity TS further suggest cirrhosis. Chronic hepatitis and hemochromatosis can also accentuate hepatic echogenicity. No focal liver lesion is identified. 4. Bilateral mild renal atrophy. 5. Nonvisualization of the pancreas and abdominal aorta due to bowel gas. Electronically Signed   By: [name] [name] M.D.   On: 09/01/2015 19:33   Code Status: Full Family Communication: Pt and family at bedside Disposition Plan: Admit for further evaluation    [name] Chiquito, MD [phone]   Review of Systems:  Constitutional: Negative for fever, chills and malaise/fatigue. Negative for diaphoresis.  HENT: + for chronic hearing loss, wears hearing aids.  Negative for ear pain, congestion, sore throat, Eyes: Negative for blurred vision, double vision, photophobia Respiratory: Negative for cough, shortness of breath    Cardiovascular: Negative for chest pain, leg swelling.  Gastrointestinal: + for abdominal pain Negative for nausea, vomiting, heartburn, constipation, blood in stool and melena.  Genitourinary: + for change in urine color Negative for dysuria, urgency, frequency Musculoskeletal: Negative for myalgias, back pain, joint pain and falls.  Skin: + for itching Negative for rash.  Neurological: Negative for dizziness and weakness.  Endo/Heme/Allergies:  Does not bruise/bleed easily.     Past Medical History  Diagnosis Date  . Hypertension     systolic  . Osteoporosis   . Edema of extremities   . Syncope 2012  . Dyslipidemia   . Chronic renal insufficiency   . Prostate cancer ([place])   . Dysphagia   . Colitis   . Microhematuria   . Pleural effusion   . ED (erectile dysfunction)   . Anemia   . Onychomycosis   . Inguinal hernia   . Shingles   . Afebrile   . Hiatus hernia syndrome     Past Surgical History  Procedure Laterality Date  . Transperineal implant of radiation seeds w/ ultrasound  2001    Social History:  reports that he quit smoking about 62 years ago. He has never used smokeless tobacco. He reports that he drinks about 8.4 oz of alcohol per week. He reports that he does not use illicit drugs.  Allergies  Allergen Reactions  . Oxycontin [Oxycodone Hcl] Other (See Comments)  ALTERED MENTAL STATUS, AGITATION, HYPERACTIVITY  . Penicillins Hives    Family History  Problem Relation Age of Onset  . Throat cancer Mother   . Lung cancer Brother   . Bone cancer Brother     Prior to Admission medications   Medication Sig Start Date End Date Taking? Authorizing Provider  acetaminophen (TYLENOL) 500 MG tablet Take 500 mg by mouth every 6 (six) hours as needed for moderate pain.   Yes Historical Provider, MD  Calcium Carbonate (CALTRATE 600 PO) Take 1 tablet by mouth daily.    Yes Historical Provider, MD  chlorthalidone (HYGROTON) 25 MG tablet Take 25 mg by mouth daily.  08/30/15  Yes Historical Provider, MD  Influenza vac split quadrivalent PF (FLUARIX) 0.5 ML injection Inject 0.5 mLs into the muscle once.   Yes Historical Provider, MD  LOTEMAX 0.5 % GEL Place 1 drop into the left eye daily. 08/24/15  Yes Historical Provider, MD  Multiple Vitamins-Minerals (CENTRUM SILVER PO) Take 1 tablet by mouth daily.    Yes Historical Provider, MD  Multiple Vitamins-Minerals (PRESERVISION AREDS) CAPS Take 1 capsule by mouth daily.   Yes Historical Provider, MD  MYRBETRIQ 50 MG TB24 tablet Take 50 mg by mouth daily. 08/24/15  Yes Historical Provider, MD  omeprazole (PRILOSEC) 40 MG capsule Take 40 mg by mouth daily. 08/24/15  Yes Historical Provider, MD  Polyethyl Glycol-Propyl Glycol (SYSTANE) 0.4-0.3 % SOLN Place 1 drop into both eyes 2 (two) times daily.   Yes Historical Provider, MD  ramipril (ALTACE) 2.5 MG capsule Take 2.5 mg by mouth daily.   Yes Historical Provider, MD  rosuvastatin (CRESTOR) 10 MG tablet Take 10 mg by mouth daily.   Yes Historical Provider, MD  furosemide (LASIX) 20 MG tablet Take 1 tablet (20 mg total) by mouth daily. 01/25/15   Fransico Meadow, PA-C  ibuprofen (ADVIL,MOTRIN) 200 MG tablet Take 400 mg by mouth every 6 (six) hours as needed for moderate pain.    Historical Provider, MD    Physical Exam: Filed Vitals:   09/01/15 2100 09/01/15 2115 09/01/15 2130 09/01/15 2154  BP: 126/58 120/81 125/60 140/65  Pulse: 75 83 77 84  Temp:      TempSrc:      Resp:    20  SpO2: 99% 100% 98% 99%    Physical Exam  Constitutional: Appears well-developed and well-nourished. No distress.  HENT: Normocephalic.Oropharynx is clear and moist. + yellowing under tongue Eyes: + sceral icterus and some conjunctival injection.  EOMI, PERRLA  Neck: Normal ROM. Neck supple.  CVS: RR, NR, S1/S2 +, no murmurs Pulmonary: Effort and breath sounds normal, no rhonchi, wheezes, rales.  Abdominal: Soft. BS +,  no distension, + tenderness on right side upper and lower,  most tender over liver.  Musculoskeletal:  No edema and no tenderness.  Lymphadenopathy: No lymphadenopathy noted, cervical Neuro: Alert. Normal muscle tone.  Skin: Skin is warm and dry. + jaundice throughout Psychiatric: Normal mood and affect. Behavior, judgment, thought content normal.   Labs on Admission:  Basic Metabolic Panel:  Recent Labs Lab 09/01/15 1618  NA 119*  K 3.5  CL 84*  CO2 23  GLUCOSE 115*  BUN 28*  CREATININE 2.23*  CALCIUM 9.5   Liver Function Tests:  Recent Labs Lab 09/01/15 1618  AST 459*  ALT 499*  ALKPHOS 844*  BILITOT 22.4*  PROT 7.8  ALBUMIN 3.8    Recent Labs Lab 09/01/15 1618  LIPASE 111*   CBC:  Recent Labs  Lab 09/01/15 1618  WBC 6.6  HGB 10.0*  HCT 29.0*  MCV 97.3  PLT 224      If 7PM-7AM, please contact night-coverage [email or web address] Password [email or web address] 09/01/2015, 10:50 PM

## 2015-09-06 NOTE — Interval H&P Note (Signed)
History and Physical Interval Note:  09/06/2015 9:38 AM  [name] [name]  has presented today for surgery, with the diagnosis of obstructive jaundice  The various methods of treatment have been discussed with the patient and family. After consideration of risks, benefits and other options for treatment, the patient has consented to  Procedure(s): UPPER ENDOSCOPIC ULTRASOUND (EUS) LINEAR (Left) ENDOSCOPIC RETROGRADE CHOLANGIOPANCREATOGRAPHY (ERCP) (N/A) as a surgical intervention .  The patient's history has been reviewed, patient examined, no change in status, stable for surgery.  I have reviewed the patient's chart and labs.  Questions were answered to the patient's satisfaction.     [name] [name] M  Assessment:  1.  Obstructive jaundice. 2.  Suspicion of pancreatic head mass on CT scan.  Plan:  1.  Endoscopic ultrasound with possible fine needle aspiration biopsies. 2.  Risks (bleeding, infection, bowel perforation that could require surgery, sedation-related changes in cardiopulmonary systems), benefits (identification and possible treatment of source of symptoms, exclusion of certain causes of symptoms), and alternatives (watchful waiting, radiographic imaging studies, empiric medical treatment) of upper endoscopy with ultrasound and possible biopsies (EUS +/- FNA) were explained to patient/family in detail and patient wishes to proceed. 3.  Endoscopic retrograde cholangiopancreatography with hopeful biliary stent placement. 4.  Risks (up to and including bleeding, infection, perforation, pancreatitis that can be complicated by infected necrosis and death), benefits (removal of stones, alleviating blockage, decreasing risk of cholangitis or choledocholithiasis-related pancreatitis), and alternatives (watchful waiting, percutaneous transhepatic cholangiography) of ERCP were explained to patient/family in detail and patient elects to proceed.

## 2015-09-06 NOTE — Anesthesia Procedure Notes (Signed)
Procedure Name: Intubation Date/Time: 09/06/2015 9:48 AM Performed by: Glory Buff Pre-anesthesia Checklist: Patient identified, Emergency Drugs available, Suction available and Patient being monitored Patient Re-evaluated:Patient Re-evaluated prior to inductionOxygen Delivery Method: Circle System Utilized Preoxygenation: Pre-oxygenation with 100% oxygen Intubation Type: IV induction Ventilation: Mask ventilation without difficulty Laryngoscope Size: Miller and 3 Grade View: Grade I Tube type: Oral Tube size: 7.5 mm Number of attempts: 1 Airway Equipment and Method: Stylet and Oral airway Placement Confirmation: ETT inserted through vocal cords under direct vision,  positive ETCO2 and breath sounds checked- equal and bilateral Secured at: 20 cm Tube secured with: Tape Dental Injury: Teeth and Oropharynx as per pre-operative assessment

## 2015-09-06 NOTE — Anesthesia Postprocedure Evaluation (Signed)
Anesthesia Post Note  Patient: [name] [name]  Procedure(s) Performed: Procedure(s) (LRB): UPPER ENDOSCOPIC ULTRASOUND (EUS) LINEAR (Left) ENDOSCOPIC RETROGRADE CHOLANGIOPANCREATOGRAPHY (ERCP) (N/A)  Patient location during evaluation: PACU Anesthesia Type: General Level of consciousness: awake and alert Pain management: pain level controlled Vital Signs Assessment: post-procedure vital signs reviewed and stable Respiratory status: spontaneous breathing Cardiovascular status: blood pressure returned to baseline Postop Assessment: no signs of nausea or vomiting Anesthetic complications: no    Last Vitals:  Filed Vitals:   09/06/15 1220 09/06/15 1230  BP: 130/56 127/62  Pulse: 72 70  Temp:    Resp: 15 16    Last Pain: There were no vitals filed for this visit.               Tiajuana Amass

## 2015-09-06 NOTE — Anesthesia Preprocedure Evaluation (Addendum)
Anesthesia Evaluation  Patient identified by MRN, date of birth, ID band Patient awake    Reviewed: Allergy & Precautions, NPO status , Patient's Chart, lab work & pertinent test results  Airway Mallampati: II  TM Distance: >3 FB Neck ROM: Full    Dental  (+) Upper Dentures   Pulmonary former smoker,    breath sounds clear to auscultation       Cardiovascular hypertension,  Rhythm:Regular Rate:Normal     Neuro/Psych Depression negative neurological ROS     GI/Hepatic hiatal hernia, GERD  ,Obstructive jaundice with transaminitis   Endo/Other  negative endocrine ROS  Renal/GU CRFRenal disease     Musculoskeletal  (+) Arthritis ,   Abdominal   Peds  Hematology  (+) anemia ,   Anesthesia Other Findings   Reproductive/Obstetrics                            Lab Results  Component Value Date   WBC 5.3 09/02/2015   HGB 9.0* 09/02/2015   HCT 26.6* 09/02/2015   MCV 97.8 09/02/2015   PLT 223 09/02/2015   Lab Results  Component Value Date   CREATININE 2.11* 09/04/2015   BUN 24* 09/04/2015   NA 130* 09/04/2015   K 3.9 09/04/2015   CL 98* 09/04/2015   CO2 23 09/04/2015    Anesthesia Physical Anesthesia Plan  ASA: III  Anesthesia Plan: General   Post-op Pain Management:    Induction: Intravenous  Airway Management Planned: Oral ETT  Additional Equipment:   Intra-op Plan:   Post-operative Plan: Extubation in OR  Informed Consent: I have reviewed the patients History and Physical, chart, labs and discussed the procedure including the risks, benefits and alternatives for the proposed anesthesia with the patient or authorized representative who has indicated his/her understanding and acceptance.   Dental advisory given  Plan Discussed with: CRNA  Anesthesia Plan Comments:         Anesthesia Quick Evaluation

## 2015-09-06 NOTE — Transfer of Care (Signed)
Immediate Anesthesia Transfer of Care Note  Patient: [name] [name]  Procedure(s) Performed: Procedure(s): UPPER ENDOSCOPIC ULTRASOUND (EUS) LINEAR (Left) ENDOSCOPIC RETROGRADE CHOLANGIOPANCREATOGRAPHY (ERCP) (N/A)  Patient Location: PACU  Anesthesia Type:General  Level of Consciousness: awake, alert  and oriented  Airway & Oxygen Therapy: Patient Spontanous Breathing and Patient connected to face mask oxygen  Post-op Assessment: Report given to RN and Post -op Vital signs reviewed and stable  Post vital signs: Reviewed and stable  Last Vitals:  Filed Vitals:   09/06/15 0901  BP: 141/47  Pulse: 85  Temp: 36.8 C  Resp: 15    Complications: No apparent anesthesia complications

## 2015-09-06 NOTE — Op Note (Signed)
[facility] [place], [address]   ENDOSCOPIC ULTRASOUND PROCEDURE REPORT  PATIENT: [name] [name], [name] [name]  MR#: [id number] BIRTHDATE: 1929/01/24  GENDER: male ENDOSCOPIST: Arta Silence, MD REFERRED BY:  Lorne Skeens, M.D. PROCEDURE DATE:  09/06/2015 PROCEDURE:   Upper EUS ASA CLASS:      Class III INDICATIONS:   1.  obstructive jaundice. MEDICATIONS: general endotracheal anesthesia  DESCRIPTION OF PROCEDURE:   After the risks benefits and alternatives of the procedure were  explained, informed consent was obtained. The patient was then placed in the left, lateral, decubitus postion and IV sedation was administered. Throughout the procedure, the patients blood pressure, pulse and oxygen saturations were monitored continuously.  Under direct visualization, the linear oblique-viewing  echoendoscope was introduced through the mouth  and advanced to the second portion of the duodenum .  Water was used as necessary to provide an acoustic interface. Estimated blood loss is zero unless otherwise noted in this procedure report. Upon completion of the imaging, water was removed and the patient was sent to the recovery room in satisfactory condition.    FINDINGS:      Diffuse pancreatic glandular atrophy, likely age-related.  Within remaining parenchyma, there were scattered hyperechoic strands, hyperechoic foci and fairly pronounced lobularity.  No ampullary or pancreatic mass noted on endoscopic ultrasound.  No pancreatic mass seen, but sensitivity of detecting pancreatic pathology in setting of chronic pancreatitis is relatively diminished.  Common bile duct and common hepatic dilated to about 58mm and abruptly terminate for a couple cm without obvious mass or stone, followed by fairly normal and non-dilated 1-2 cm of distal-most common bile duct.  Gallbladder a bit distended.  IMPRESSION:     As above.  Changes consistent with idiopathic chronic  pancreatitis.  EUS suggestive of short-segment mid-CBD stricture without obvious pancreatic or biliary tract mass or stone noted.  RECOMMENDATIONS:     1.  Watch for potential complications of procedure. 2.  Proceed with ERCP for attempted biliary decompression.   _______________________________ Lorrin MaisArta Silence, MD 09/06/2015 11:34 AM   CC:

## 2015-09-06 NOTE — Op Note (Addendum)
[facility] [place], [address]   ERCP PROCEDURE REPORT        EXAM DATE: 09/06/2015  PATIENT NAME:          [name] [name], [name] [name]          MR #: [id number] BIRTHDATE:       1929/02/14     VISIT #:     [phone] ATTENDING:     Arta Silence, MD     STATUS:     outpatient REFERRING MD:       Lorne Skeens, M.D.  INDICATIONS:  The patient is a 81 yr old male here for an ERCP due to obstructive jaundice. PROCEDURE PERFORMED:     ERCP with sphincterotomy/papillotomy ERCP with stricture cytology brushings and biliary wallstent placement ERCP with balloon dilation MEDICATIONS:     general endotracheal anesthesia, ciprofloxacin 400 mg IV  CONSENT: The patient understands the risks and benefits of the procedure and understands that these risks include, but are not limited to: sedation, allergic reaction, infection, perforation and/or bleeding. Alternative means of evaluation and treatment include, among others: physical exam, x-rays, and/or surgical intervention. The patient elects to proceed with this endoscopic procedure.  DESCRIPTION OF PROCEDURE: Informed was verified, confirmed and timeout was successfully executed by the treatment team. With the patient in left semi-prone position, medications were administered intravenously.The side-viewing therapeutic duodenoscope  was passed from the mouth into the esophagus and further advanced from the esophagus into the stomach. From stomach scope was directed to the second portion of the duodenum .  Major papilla was aligned with the duodenoscope. The scope position was confirmed fluoroscopically. Ampullary orifice was a bit fish-mouthed in appearance without bile flow pre-instrumentation.  Deep biliary access was obtained.  Distal 1-2 cm of bile duct was normal, there was very tight 1-2 cm mid CBD stricture and intra- and extrahepatic biliary tree upstream of the stricture was dilated.  Small  access biliary sphincterotomy was performed.  Stricture was dilated with biliary dilating balloon. Stricture was brushed for cytology.  10 x 60 fully covered biliary wallstent was placed (10 x 40 was found to be too short).  There was good dark bile flow through stent after placement. Pancreatogram was not obtained, intentionally.      ADVERSE EVENT:     None immediately noted. IMPRESSIONS:     Tight, short mid CBD stricture.  Etiologies could include chronic pancreatitis related stricture versus short-segment cholangiocarcinoma versus small pancreatic lesion (not visible on EUS due to chronic pancreatitis changes).  RECOMMENDATIONS:     1.  Watch for potential complications of procedure. 2.  Await cytology brushing results. 3.  Follow clinical and biochemical response to stenting. 4.  Recheck LFTs in one week. 5.  Office visit with me in 2-3 weeks. REPEAT EXAM:  __________________________________ Arta Silence, MD eSigned:  Arta Silence, MD 09/06/2015 11:55 AM  Revised: 09/06/2015 11:55 AMcc:

## 2015-09-07 LAB — CULTURE, BLOOD (ROUTINE X 2)
CULTURE: NO GROWTH
Culture: NO GROWTH

## 2015-09-08 ENCOUNTER — Encounter (HOSPITAL_COMMUNITY): Payer: Self-pay | Admitting: Gastroenterology

## 2015-09-15 DIAGNOSIS — R17 Unspecified jaundice: Secondary | ICD-10-CM | POA: Diagnosis not present

## 2015-09-22 DIAGNOSIS — K831 Obstruction of bile duct: Secondary | ICD-10-CM | POA: Diagnosis not present

## 2015-09-29 ENCOUNTER — Other Ambulatory Visit (HOSPITAL_COMMUNITY): Payer: Self-pay | Admitting: Gastroenterology

## 2015-09-29 DIAGNOSIS — K831 Obstruction of bile duct: Secondary | ICD-10-CM

## 2015-09-29 DIAGNOSIS — R978 Other abnormal tumor markers: Secondary | ICD-10-CM

## 2015-10-13 ENCOUNTER — Ambulatory Visit (HOSPITAL_COMMUNITY): Payer: Medicare Other

## 2015-10-17 DIAGNOSIS — M6281 Muscle weakness (generalized): Secondary | ICD-10-CM | POA: Diagnosis not present

## 2015-10-17 DIAGNOSIS — R2689 Other abnormalities of gait and mobility: Secondary | ICD-10-CM | POA: Diagnosis not present

## 2015-10-17 DIAGNOSIS — M8969 Osteopathy after poliomyelitis, multiple sites: Secondary | ICD-10-CM | POA: Diagnosis not present

## 2015-10-19 DIAGNOSIS — M8969 Osteopathy after poliomyelitis, multiple sites: Secondary | ICD-10-CM | POA: Diagnosis not present

## 2015-10-19 DIAGNOSIS — R2689 Other abnormalities of gait and mobility: Secondary | ICD-10-CM | POA: Diagnosis not present

## 2015-10-19 DIAGNOSIS — M6281 Muscle weakness (generalized): Secondary | ICD-10-CM | POA: Diagnosis not present

## 2015-10-23 DIAGNOSIS — I10 Essential (primary) hypertension: Secondary | ICD-10-CM | POA: Diagnosis not present

## 2015-10-23 DIAGNOSIS — D649 Anemia, unspecified: Secondary | ICD-10-CM | POA: Diagnosis not present

## 2015-10-23 DIAGNOSIS — R7301 Impaired fasting glucose: Secondary | ICD-10-CM | POA: Diagnosis not present

## 2015-10-23 DIAGNOSIS — E7800 Pure hypercholesterolemia, unspecified: Secondary | ICD-10-CM | POA: Diagnosis not present

## 2015-10-24 ENCOUNTER — Encounter (HOSPITAL_COMMUNITY)
Admission: RE | Admit: 2015-10-24 | Discharge: 2015-10-24 | Disposition: A | Discharge status: Home / Self Care | Payer: Medicare Other | Source: Ambulatory Visit | Attending: Gastroenterology | Admitting: Gastroenterology

## 2015-10-24 DIAGNOSIS — S2232XA Fracture of one rib, left side, initial encounter for closed fracture: Secondary | ICD-10-CM | POA: Diagnosis not present

## 2015-10-24 DIAGNOSIS — Z0189 Encounter for other specified special examinations: Secondary | ICD-10-CM | POA: Insufficient documentation

## 2015-10-24 DIAGNOSIS — Z9689 Presence of other specified functional implants: Secondary | ICD-10-CM | POA: Diagnosis not present

## 2015-10-24 DIAGNOSIS — K831 Obstruction of bile duct: Secondary | ICD-10-CM | POA: Insufficient documentation

## 2015-10-24 DIAGNOSIS — R978 Other abnormal tumor markers: Secondary | ICD-10-CM | POA: Insufficient documentation

## 2015-10-24 LAB — GLUCOSE, CAPILLARY: GLUCOSE-CAPILLARY: 95 mg/dL (ref 65–99)

## 2015-10-24 MED ORDER — FLUDEOXYGLUCOSE F - 18 (FDG) INJECTION
8.5000 | Freq: Once | INTRAVENOUS | Status: AC | PRN
  Administered 2015-10-24: 8.5 via INTRAVENOUS

## 2015-10-26 DIAGNOSIS — N183 Chronic kidney disease, stage 3 (moderate): Secondary | ICD-10-CM | POA: Diagnosis not present

## 2015-10-26 DIAGNOSIS — D649 Anemia, unspecified: Secondary | ICD-10-CM | POA: Diagnosis not present

## 2015-10-26 DIAGNOSIS — M6281 Muscle weakness (generalized): Secondary | ICD-10-CM | POA: Diagnosis not present

## 2015-10-26 DIAGNOSIS — R2689 Other abnormalities of gait and mobility: Secondary | ICD-10-CM | POA: Diagnosis not present

## 2015-10-26 DIAGNOSIS — M810 Age-related osteoporosis without current pathological fracture: Secondary | ICD-10-CM | POA: Diagnosis not present

## 2015-10-26 DIAGNOSIS — K219 Gastro-esophageal reflux disease without esophagitis: Secondary | ICD-10-CM | POA: Diagnosis not present

## 2015-10-26 DIAGNOSIS — I10 Essential (primary) hypertension: Secondary | ICD-10-CM | POA: Diagnosis not present

## 2015-10-26 DIAGNOSIS — R7301 Impaired fasting glucose: Secondary | ICD-10-CM | POA: Diagnosis not present

## 2015-10-26 DIAGNOSIS — M8969 Osteopathy after poliomyelitis, multiple sites: Secondary | ICD-10-CM | POA: Diagnosis not present

## 2015-10-31 DIAGNOSIS — M8969 Osteopathy after poliomyelitis, multiple sites: Secondary | ICD-10-CM | POA: Diagnosis not present

## 2015-10-31 DIAGNOSIS — M6281 Muscle weakness (generalized): Secondary | ICD-10-CM | POA: Diagnosis not present

## 2015-10-31 DIAGNOSIS — R2689 Other abnormalities of gait and mobility: Secondary | ICD-10-CM | POA: Diagnosis not present

## 2015-11-02 DIAGNOSIS — M8969 Osteopathy after poliomyelitis, multiple sites: Secondary | ICD-10-CM | POA: Diagnosis not present

## 2015-11-02 DIAGNOSIS — M6281 Muscle weakness (generalized): Secondary | ICD-10-CM | POA: Diagnosis not present

## 2015-11-02 DIAGNOSIS — R2689 Other abnormalities of gait and mobility: Secondary | ICD-10-CM | POA: Diagnosis not present

## 2015-11-09 DIAGNOSIS — M6281 Muscle weakness (generalized): Secondary | ICD-10-CM | POA: Diagnosis not present

## 2015-11-09 DIAGNOSIS — R2689 Other abnormalities of gait and mobility: Secondary | ICD-10-CM | POA: Diagnosis not present

## 2015-11-14 DIAGNOSIS — M6281 Muscle weakness (generalized): Secondary | ICD-10-CM | POA: Diagnosis not present

## 2015-11-14 DIAGNOSIS — R2689 Other abnormalities of gait and mobility: Secondary | ICD-10-CM | POA: Diagnosis not present

## 2015-11-16 DIAGNOSIS — M6281 Muscle weakness (generalized): Secondary | ICD-10-CM | POA: Diagnosis not present

## 2015-11-16 DIAGNOSIS — R2689 Other abnormalities of gait and mobility: Secondary | ICD-10-CM | POA: Diagnosis not present

## 2015-11-21 DIAGNOSIS — M6281 Muscle weakness (generalized): Secondary | ICD-10-CM | POA: Diagnosis not present

## 2015-11-21 DIAGNOSIS — R2689 Other abnormalities of gait and mobility: Secondary | ICD-10-CM | POA: Diagnosis not present

## 2015-11-23 DIAGNOSIS — M6281 Muscle weakness (generalized): Secondary | ICD-10-CM | POA: Diagnosis not present

## 2015-11-23 DIAGNOSIS — R2689 Other abnormalities of gait and mobility: Secondary | ICD-10-CM | POA: Diagnosis not present

## 2015-11-28 DIAGNOSIS — R2689 Other abnormalities of gait and mobility: Secondary | ICD-10-CM | POA: Diagnosis not present

## 2015-11-28 DIAGNOSIS — M6281 Muscle weakness (generalized): Secondary | ICD-10-CM | POA: Diagnosis not present

## 2015-11-30 DIAGNOSIS — R2689 Other abnormalities of gait and mobility: Secondary | ICD-10-CM | POA: Diagnosis not present

## 2015-11-30 DIAGNOSIS — M6281 Muscle weakness (generalized): Secondary | ICD-10-CM | POA: Diagnosis not present

## 2015-12-04 DIAGNOSIS — K831 Obstruction of bile duct: Secondary | ICD-10-CM | POA: Diagnosis not present

## 2015-12-05 DIAGNOSIS — M6281 Muscle weakness (generalized): Secondary | ICD-10-CM | POA: Diagnosis not present

## 2015-12-05 DIAGNOSIS — R2689 Other abnormalities of gait and mobility: Secondary | ICD-10-CM | POA: Diagnosis not present

## 2015-12-07 DIAGNOSIS — M6281 Muscle weakness (generalized): Secondary | ICD-10-CM | POA: Diagnosis not present

## 2015-12-07 DIAGNOSIS — R2689 Other abnormalities of gait and mobility: Secondary | ICD-10-CM | POA: Diagnosis not present

## 2015-12-12 DIAGNOSIS — R2689 Other abnormalities of gait and mobility: Secondary | ICD-10-CM | POA: Diagnosis not present

## 2015-12-12 DIAGNOSIS — M6281 Muscle weakness (generalized): Secondary | ICD-10-CM | POA: Diagnosis not present

## 2015-12-14 DIAGNOSIS — R2689 Other abnormalities of gait and mobility: Secondary | ICD-10-CM | POA: Diagnosis not present

## 2015-12-14 DIAGNOSIS — M6281 Muscle weakness (generalized): Secondary | ICD-10-CM | POA: Diagnosis not present

## 2015-12-22 ENCOUNTER — Ambulatory Visit (INDEPENDENT_AMBULATORY_CARE_PROVIDER_SITE_OTHER): Payer: Medicare Other | Admitting: Ophthalmology

## 2015-12-22 DIAGNOSIS — H348322 Tributary (branch) retinal vein occlusion, left eye, stable: Secondary | ICD-10-CM

## 2015-12-22 DIAGNOSIS — H353131 Nonexudative age-related macular degeneration, bilateral, early dry stage: Secondary | ICD-10-CM

## 2015-12-22 DIAGNOSIS — H43813 Vitreous degeneration, bilateral: Secondary | ICD-10-CM

## 2015-12-26 DIAGNOSIS — R2689 Other abnormalities of gait and mobility: Secondary | ICD-10-CM | POA: Diagnosis not present

## 2015-12-26 DIAGNOSIS — M6281 Muscle weakness (generalized): Secondary | ICD-10-CM | POA: Diagnosis not present

## 2015-12-28 DIAGNOSIS — M6281 Muscle weakness (generalized): Secondary | ICD-10-CM | POA: Diagnosis not present

## 2015-12-28 DIAGNOSIS — R2689 Other abnormalities of gait and mobility: Secondary | ICD-10-CM | POA: Diagnosis not present

## 2016-01-01 DIAGNOSIS — D485 Neoplasm of uncertain behavior of skin: Secondary | ICD-10-CM | POA: Diagnosis not present

## 2016-01-01 DIAGNOSIS — C44311 Basal cell carcinoma of skin of nose: Secondary | ICD-10-CM | POA: Diagnosis not present

## 2016-01-02 DIAGNOSIS — R2689 Other abnormalities of gait and mobility: Secondary | ICD-10-CM | POA: Diagnosis not present

## 2016-01-02 DIAGNOSIS — M6281 Muscle weakness (generalized): Secondary | ICD-10-CM | POA: Diagnosis not present

## 2016-01-04 DIAGNOSIS — R2689 Other abnormalities of gait and mobility: Secondary | ICD-10-CM | POA: Diagnosis not present

## 2016-01-04 DIAGNOSIS — M6281 Muscle weakness (generalized): Secondary | ICD-10-CM | POA: Diagnosis not present

## 2016-01-09 DIAGNOSIS — M6281 Muscle weakness (generalized): Secondary | ICD-10-CM | POA: Diagnosis not present

## 2016-01-09 DIAGNOSIS — R2689 Other abnormalities of gait and mobility: Secondary | ICD-10-CM | POA: Diagnosis not present

## 2016-01-11 DIAGNOSIS — R2689 Other abnormalities of gait and mobility: Secondary | ICD-10-CM | POA: Diagnosis not present

## 2016-01-11 DIAGNOSIS — M6281 Muscle weakness (generalized): Secondary | ICD-10-CM | POA: Diagnosis not present

## 2016-01-16 DIAGNOSIS — R2689 Other abnormalities of gait and mobility: Secondary | ICD-10-CM | POA: Diagnosis not present

## 2016-01-16 DIAGNOSIS — M6281 Muscle weakness (generalized): Secondary | ICD-10-CM | POA: Diagnosis not present

## 2016-01-18 DIAGNOSIS — M6281 Muscle weakness (generalized): Secondary | ICD-10-CM | POA: Diagnosis not present

## 2016-01-18 DIAGNOSIS — R2689 Other abnormalities of gait and mobility: Secondary | ICD-10-CM | POA: Diagnosis not present

## 2016-01-23 DIAGNOSIS — M6281 Muscle weakness (generalized): Secondary | ICD-10-CM | POA: Diagnosis not present

## 2016-01-23 DIAGNOSIS — R2689 Other abnormalities of gait and mobility: Secondary | ICD-10-CM | POA: Diagnosis not present

## 2016-01-24 DIAGNOSIS — I10 Essential (primary) hypertension: Secondary | ICD-10-CM | POA: Diagnosis not present

## 2016-01-24 DIAGNOSIS — D649 Anemia, unspecified: Secondary | ICD-10-CM | POA: Diagnosis not present

## 2016-01-24 DIAGNOSIS — R7301 Impaired fasting glucose: Secondary | ICD-10-CM | POA: Diagnosis not present

## 2016-01-24 DIAGNOSIS — E7800 Pure hypercholesterolemia, unspecified: Secondary | ICD-10-CM | POA: Diagnosis not present

## 2016-01-25 DIAGNOSIS — M6281 Muscle weakness (generalized): Secondary | ICD-10-CM | POA: Diagnosis not present

## 2016-01-25 DIAGNOSIS — R2689 Other abnormalities of gait and mobility: Secondary | ICD-10-CM | POA: Diagnosis not present

## 2016-01-26 DIAGNOSIS — R7301 Impaired fasting glucose: Secondary | ICD-10-CM | POA: Diagnosis not present

## 2016-01-26 DIAGNOSIS — I10 Essential (primary) hypertension: Secondary | ICD-10-CM | POA: Diagnosis not present

## 2016-01-26 DIAGNOSIS — N183 Chronic kidney disease, stage 3 (moderate): Secondary | ICD-10-CM | POA: Diagnosis not present

## 2016-01-26 DIAGNOSIS — E7800 Pure hypercholesterolemia, unspecified: Secondary | ICD-10-CM | POA: Diagnosis not present

## 2016-01-26 DIAGNOSIS — D649 Anemia, unspecified: Secondary | ICD-10-CM | POA: Diagnosis not present

## 2016-01-26 DIAGNOSIS — K219 Gastro-esophageal reflux disease without esophagitis: Secondary | ICD-10-CM | POA: Diagnosis not present

## 2016-01-26 DIAGNOSIS — M810 Age-related osteoporosis without current pathological fracture: Secondary | ICD-10-CM | POA: Diagnosis not present

## 2016-01-30 DIAGNOSIS — R2689 Other abnormalities of gait and mobility: Secondary | ICD-10-CM | POA: Diagnosis not present

## 2016-01-30 DIAGNOSIS — M6281 Muscle weakness (generalized): Secondary | ICD-10-CM | POA: Diagnosis not present

## 2016-02-01 DIAGNOSIS — M6281 Muscle weakness (generalized): Secondary | ICD-10-CM | POA: Diagnosis not present

## 2016-02-01 DIAGNOSIS — R2689 Other abnormalities of gait and mobility: Secondary | ICD-10-CM | POA: Diagnosis not present

## 2016-02-05 DIAGNOSIS — D485 Neoplasm of uncertain behavior of skin: Secondary | ICD-10-CM | POA: Diagnosis not present

## 2016-02-05 DIAGNOSIS — L905 Scar conditions and fibrosis of skin: Secondary | ICD-10-CM | POA: Diagnosis not present

## 2016-02-13 DIAGNOSIS — M6281 Muscle weakness (generalized): Secondary | ICD-10-CM | POA: Diagnosis not present

## 2016-02-13 DIAGNOSIS — R2689 Other abnormalities of gait and mobility: Secondary | ICD-10-CM | POA: Diagnosis not present

## 2016-02-15 DIAGNOSIS — R2689 Other abnormalities of gait and mobility: Secondary | ICD-10-CM | POA: Diagnosis not present

## 2016-02-15 DIAGNOSIS — M6281 Muscle weakness (generalized): Secondary | ICD-10-CM | POA: Diagnosis not present

## 2016-03-12 DIAGNOSIS — R2689 Other abnormalities of gait and mobility: Secondary | ICD-10-CM | POA: Diagnosis not present

## 2016-03-12 DIAGNOSIS — M6281 Muscle weakness (generalized): Secondary | ICD-10-CM | POA: Diagnosis not present

## 2016-03-14 DIAGNOSIS — M6281 Muscle weakness (generalized): Secondary | ICD-10-CM | POA: Diagnosis not present

## 2016-03-14 DIAGNOSIS — R2689 Other abnormalities of gait and mobility: Secondary | ICD-10-CM | POA: Diagnosis not present

## 2016-03-28 DIAGNOSIS — R2689 Other abnormalities of gait and mobility: Secondary | ICD-10-CM | POA: Diagnosis not present

## 2016-03-28 DIAGNOSIS — M6281 Muscle weakness (generalized): Secondary | ICD-10-CM | POA: Diagnosis not present

## 2016-04-05 DIAGNOSIS — R3915 Urgency of urination: Secondary | ICD-10-CM | POA: Diagnosis not present

## 2016-04-05 DIAGNOSIS — Z8546 Personal history of malignant neoplasm of prostate: Secondary | ICD-10-CM | POA: Diagnosis not present

## 2016-04-05 DIAGNOSIS — R351 Nocturia: Secondary | ICD-10-CM | POA: Diagnosis not present

## 2016-04-26 DIAGNOSIS — I10 Essential (primary) hypertension: Secondary | ICD-10-CM | POA: Diagnosis not present

## 2016-04-26 DIAGNOSIS — R7301 Impaired fasting glucose: Secondary | ICD-10-CM | POA: Diagnosis not present

## 2016-04-26 DIAGNOSIS — D649 Anemia, unspecified: Secondary | ICD-10-CM | POA: Diagnosis not present

## 2016-04-30 DIAGNOSIS — N183 Chronic kidney disease, stage 3 (moderate): Secondary | ICD-10-CM | POA: Diagnosis not present

## 2016-04-30 DIAGNOSIS — K219 Gastro-esophageal reflux disease without esophagitis: Secondary | ICD-10-CM | POA: Diagnosis not present

## 2016-04-30 DIAGNOSIS — R7301 Impaired fasting glucose: Secondary | ICD-10-CM | POA: Diagnosis not present

## 2016-04-30 DIAGNOSIS — E7800 Pure hypercholesterolemia, unspecified: Secondary | ICD-10-CM | POA: Diagnosis not present

## 2016-04-30 DIAGNOSIS — D649 Anemia, unspecified: Secondary | ICD-10-CM | POA: Diagnosis not present

## 2016-04-30 DIAGNOSIS — M810 Age-related osteoporosis without current pathological fracture: Secondary | ICD-10-CM | POA: Diagnosis not present

## 2016-04-30 DIAGNOSIS — I10 Essential (primary) hypertension: Secondary | ICD-10-CM | POA: Diagnosis not present

## 2016-05-30 DIAGNOSIS — R2689 Other abnormalities of gait and mobility: Secondary | ICD-10-CM | POA: Diagnosis not present

## 2016-05-30 DIAGNOSIS — M6281 Muscle weakness (generalized): Secondary | ICD-10-CM | POA: Diagnosis not present

## 2016-06-03 ENCOUNTER — Other Ambulatory Visit: Payer: Self-pay

## 2016-06-04 DIAGNOSIS — R2689 Other abnormalities of gait and mobility: Secondary | ICD-10-CM | POA: Diagnosis not present

## 2016-06-04 DIAGNOSIS — M6281 Muscle weakness (generalized): Secondary | ICD-10-CM | POA: Diagnosis not present

## 2016-06-05 DIAGNOSIS — M67911 Unspecified disorder of synovium and tendon, right shoulder: Secondary | ICD-10-CM | POA: Diagnosis not present

## 2016-06-06 DIAGNOSIS — M6281 Muscle weakness (generalized): Secondary | ICD-10-CM | POA: Diagnosis not present

## 2016-06-06 DIAGNOSIS — R2689 Other abnormalities of gait and mobility: Secondary | ICD-10-CM | POA: Diagnosis not present

## 2016-06-11 DIAGNOSIS — R2689 Other abnormalities of gait and mobility: Secondary | ICD-10-CM | POA: Diagnosis not present

## 2016-06-11 DIAGNOSIS — M6281 Muscle weakness (generalized): Secondary | ICD-10-CM | POA: Diagnosis not present

## 2016-06-13 DIAGNOSIS — M6281 Muscle weakness (generalized): Secondary | ICD-10-CM | POA: Diagnosis not present

## 2016-06-13 DIAGNOSIS — R2689 Other abnormalities of gait and mobility: Secondary | ICD-10-CM | POA: Diagnosis not present

## 2016-06-18 DIAGNOSIS — R2689 Other abnormalities of gait and mobility: Secondary | ICD-10-CM | POA: Diagnosis not present

## 2016-06-18 DIAGNOSIS — M6281 Muscle weakness (generalized): Secondary | ICD-10-CM | POA: Diagnosis not present

## 2016-06-25 DIAGNOSIS — R2689 Other abnormalities of gait and mobility: Secondary | ICD-10-CM | POA: Diagnosis not present

## 2016-06-25 DIAGNOSIS — M6281 Muscle weakness (generalized): Secondary | ICD-10-CM | POA: Diagnosis not present

## 2016-06-27 DIAGNOSIS — R2689 Other abnormalities of gait and mobility: Secondary | ICD-10-CM | POA: Diagnosis not present

## 2016-06-27 DIAGNOSIS — M6281 Muscle weakness (generalized): Secondary | ICD-10-CM | POA: Diagnosis not present

## 2016-07-02 DIAGNOSIS — R2689 Other abnormalities of gait and mobility: Secondary | ICD-10-CM | POA: Diagnosis not present

## 2016-07-02 DIAGNOSIS — M6281 Muscle weakness (generalized): Secondary | ICD-10-CM | POA: Diagnosis not present

## 2016-07-03 DIAGNOSIS — M545 Low back pain: Secondary | ICD-10-CM | POA: Diagnosis not present

## 2016-07-04 DIAGNOSIS — M6281 Muscle weakness (generalized): Secondary | ICD-10-CM | POA: Diagnosis not present

## 2016-07-04 DIAGNOSIS — L570 Actinic keratosis: Secondary | ICD-10-CM | POA: Diagnosis not present

## 2016-07-04 DIAGNOSIS — R2689 Other abnormalities of gait and mobility: Secondary | ICD-10-CM | POA: Diagnosis not present

## 2016-07-16 DIAGNOSIS — M6281 Muscle weakness (generalized): Secondary | ICD-10-CM | POA: Diagnosis not present

## 2016-07-16 DIAGNOSIS — R2689 Other abnormalities of gait and mobility: Secondary | ICD-10-CM | POA: Diagnosis not present

## 2016-07-25 DIAGNOSIS — D649 Anemia, unspecified: Secondary | ICD-10-CM | POA: Diagnosis not present

## 2016-07-25 DIAGNOSIS — R7301 Impaired fasting glucose: Secondary | ICD-10-CM | POA: Diagnosis not present

## 2016-07-25 DIAGNOSIS — I10 Essential (primary) hypertension: Secondary | ICD-10-CM | POA: Diagnosis not present

## 2016-07-29 DIAGNOSIS — K219 Gastro-esophageal reflux disease without esophagitis: Secondary | ICD-10-CM | POA: Diagnosis not present

## 2016-07-29 DIAGNOSIS — D649 Anemia, unspecified: Secondary | ICD-10-CM | POA: Diagnosis not present

## 2016-07-29 DIAGNOSIS — I10 Essential (primary) hypertension: Secondary | ICD-10-CM | POA: Diagnosis not present

## 2016-07-29 DIAGNOSIS — M810 Age-related osteoporosis without current pathological fracture: Secondary | ICD-10-CM | POA: Diagnosis not present

## 2016-07-29 DIAGNOSIS — N183 Chronic kidney disease, stage 3 (moderate): Secondary | ICD-10-CM | POA: Diagnosis not present

## 2016-07-29 DIAGNOSIS — R7301 Impaired fasting glucose: Secondary | ICD-10-CM | POA: Diagnosis not present

## 2016-07-29 DIAGNOSIS — Z23 Encounter for immunization: Secondary | ICD-10-CM | POA: Diagnosis not present

## 2016-07-29 DIAGNOSIS — E7800 Pure hypercholesterolemia, unspecified: Secondary | ICD-10-CM | POA: Diagnosis not present

## 2016-07-30 DIAGNOSIS — R2689 Other abnormalities of gait and mobility: Secondary | ICD-10-CM | POA: Diagnosis not present

## 2016-07-30 DIAGNOSIS — M6281 Muscle weakness (generalized): Secondary | ICD-10-CM | POA: Diagnosis not present

## 2016-08-02 DIAGNOSIS — M7542 Impingement syndrome of left shoulder: Secondary | ICD-10-CM | POA: Diagnosis not present

## 2016-08-21 DIAGNOSIS — R197 Diarrhea, unspecified: Secondary | ICD-10-CM | POA: Diagnosis not present

## 2016-08-22 DIAGNOSIS — M6281 Muscle weakness (generalized): Secondary | ICD-10-CM | POA: Diagnosis not present

## 2016-08-22 DIAGNOSIS — R2689 Other abnormalities of gait and mobility: Secondary | ICD-10-CM | POA: Diagnosis not present

## 2016-08-27 DIAGNOSIS — M8969 Osteopathy after poliomyelitis, multiple sites: Secondary | ICD-10-CM | POA: Diagnosis not present

## 2016-08-27 DIAGNOSIS — M6281 Muscle weakness (generalized): Secondary | ICD-10-CM | POA: Diagnosis not present

## 2016-08-27 DIAGNOSIS — R2689 Other abnormalities of gait and mobility: Secondary | ICD-10-CM | POA: Diagnosis not present

## 2016-09-03 DIAGNOSIS — L299 Pruritus, unspecified: Secondary | ICD-10-CM | POA: Diagnosis not present

## 2016-09-03 DIAGNOSIS — R197 Diarrhea, unspecified: Secondary | ICD-10-CM | POA: Diagnosis not present

## 2016-09-03 DIAGNOSIS — K831 Obstruction of bile duct: Secondary | ICD-10-CM | POA: Diagnosis not present

## 2016-09-05 DIAGNOSIS — R2689 Other abnormalities of gait and mobility: Secondary | ICD-10-CM | POA: Diagnosis not present

## 2016-09-05 DIAGNOSIS — M6281 Muscle weakness (generalized): Secondary | ICD-10-CM | POA: Diagnosis not present

## 2016-09-10 DIAGNOSIS — L309 Dermatitis, unspecified: Secondary | ICD-10-CM | POA: Diagnosis not present

## 2016-10-29 DIAGNOSIS — I10 Essential (primary) hypertension: Secondary | ICD-10-CM | POA: Diagnosis not present

## 2016-10-29 DIAGNOSIS — M810 Age-related osteoporosis without current pathological fracture: Secondary | ICD-10-CM | POA: Diagnosis not present

## 2016-10-29 DIAGNOSIS — R7301 Impaired fasting glucose: Secondary | ICD-10-CM | POA: Diagnosis not present

## 2016-10-29 DIAGNOSIS — D649 Anemia, unspecified: Secondary | ICD-10-CM | POA: Diagnosis not present

## 2016-11-05 DIAGNOSIS — R269 Unspecified abnormalities of gait and mobility: Secondary | ICD-10-CM | POA: Diagnosis not present

## 2016-11-05 DIAGNOSIS — M6281 Muscle weakness (generalized): Secondary | ICD-10-CM | POA: Diagnosis not present

## 2016-11-12 DIAGNOSIS — R269 Unspecified abnormalities of gait and mobility: Secondary | ICD-10-CM | POA: Diagnosis not present

## 2016-11-12 DIAGNOSIS — M6281 Muscle weakness (generalized): Secondary | ICD-10-CM | POA: Diagnosis not present

## 2016-11-20 ENCOUNTER — Other Ambulatory Visit: Payer: Self-pay | Admitting: Physician Assistant

## 2016-11-20 DIAGNOSIS — K831 Obstruction of bile duct: Secondary | ICD-10-CM | POA: Diagnosis not present

## 2016-11-20 DIAGNOSIS — R634 Abnormal weight loss: Secondary | ICD-10-CM

## 2016-11-20 DIAGNOSIS — R1010 Upper abdominal pain, unspecified: Secondary | ICD-10-CM

## 2016-11-20 DIAGNOSIS — R110 Nausea: Secondary | ICD-10-CM

## 2016-11-22 ENCOUNTER — Inpatient Hospital Stay (HOSPITAL_COMMUNITY): Payer: Medicare Other

## 2016-11-22 ENCOUNTER — Emergency Department (HOSPITAL_COMMUNITY): Payer: Medicare Other

## 2016-11-22 ENCOUNTER — Encounter (HOSPITAL_COMMUNITY): Payer: Self-pay | Admitting: Emergency Medicine

## 2016-11-22 ENCOUNTER — Inpatient Hospital Stay (HOSPITAL_COMMUNITY)
Admission: EM | Admit: 2016-11-22 | Discharge: 2016-11-27 | DRG: 919 | Disposition: A | Discharge status: Home Health | Payer: Medicare Other | Attending: Family Medicine | Admitting: Family Medicine

## 2016-11-22 DIAGNOSIS — M25512 Pain in left shoulder: Secondary | ICD-10-CM | POA: Diagnosis not present

## 2016-11-22 DIAGNOSIS — H3530 Unspecified macular degeneration: Secondary | ICD-10-CM | POA: Diagnosis present

## 2016-11-22 DIAGNOSIS — Z85828 Personal history of other malignant neoplasm of skin: Secondary | ICD-10-CM

## 2016-11-22 DIAGNOSIS — Z960 Presence of urogenital implants: Secondary | ICD-10-CM | POA: Diagnosis present

## 2016-11-22 DIAGNOSIS — Z87891 Personal history of nicotine dependence: Secondary | ICD-10-CM

## 2016-11-22 DIAGNOSIS — Z880 Allergy status to penicillin: Secondary | ICD-10-CM | POA: Diagnosis not present

## 2016-11-22 DIAGNOSIS — Y838 Other surgical procedures as the cause of abnormal reaction of the patient, or of later complication, without mention of misadventure at the time of the procedure: Secondary | ICD-10-CM | POA: Diagnosis present

## 2016-11-22 DIAGNOSIS — K449 Diaphragmatic hernia without obstruction or gangrene: Secondary | ICD-10-CM | POA: Diagnosis present

## 2016-11-22 DIAGNOSIS — M5410 Radiculopathy, site unspecified: Secondary | ICD-10-CM

## 2016-11-22 DIAGNOSIS — N189 Chronic kidney disease, unspecified: Secondary | ICD-10-CM | POA: Diagnosis present

## 2016-11-22 DIAGNOSIS — T8579XA Infection and inflammatory reaction due to other internal prosthetic devices, implants and grafts, initial encounter: Principal | ICD-10-CM | POA: Diagnosis present

## 2016-11-22 DIAGNOSIS — E785 Hyperlipidemia, unspecified: Secondary | ICD-10-CM | POA: Diagnosis present

## 2016-11-22 DIAGNOSIS — K219 Gastro-esophageal reflux disease without esophagitis: Secondary | ICD-10-CM | POA: Diagnosis present

## 2016-11-22 DIAGNOSIS — A4151 Sepsis due to Escherichia coli [E. coli]: Secondary | ICD-10-CM | POA: Diagnosis present

## 2016-11-22 DIAGNOSIS — R1032 Left lower quadrant pain: Secondary | ICD-10-CM | POA: Diagnosis not present

## 2016-11-22 DIAGNOSIS — R7401 Elevation of levels of liver transaminase levels: Secondary | ICD-10-CM

## 2016-11-22 DIAGNOSIS — Z923 Personal history of irradiation: Secondary | ICD-10-CM

## 2016-11-22 DIAGNOSIS — I129 Hypertensive chronic kidney disease with stage 1 through stage 4 chronic kidney disease, or unspecified chronic kidney disease: Secondary | ICD-10-CM | POA: Diagnosis present

## 2016-11-22 DIAGNOSIS — R531 Weakness: Secondary | ICD-10-CM | POA: Diagnosis not present

## 2016-11-22 DIAGNOSIS — R740 Nonspecific elevation of levels of transaminase and lactic acid dehydrogenase [LDH]: Secondary | ICD-10-CM | POA: Diagnosis present

## 2016-11-22 DIAGNOSIS — Z79899 Other long term (current) drug therapy: Secondary | ICD-10-CM

## 2016-11-22 DIAGNOSIS — M810 Age-related osteoporosis without current pathological fracture: Secondary | ICD-10-CM | POA: Diagnosis present

## 2016-11-22 DIAGNOSIS — R200 Anesthesia of skin: Secondary | ICD-10-CM | POA: Diagnosis not present

## 2016-11-22 DIAGNOSIS — K830 Cholangitis: Secondary | ICD-10-CM | POA: Diagnosis present

## 2016-11-22 DIAGNOSIS — K869 Disease of pancreas, unspecified: Secondary | ICD-10-CM | POA: Diagnosis present

## 2016-11-22 DIAGNOSIS — R748 Abnormal levels of other serum enzymes: Secondary | ICD-10-CM

## 2016-11-22 DIAGNOSIS — K8689 Other specified diseases of pancreas: Secondary | ICD-10-CM | POA: Diagnosis not present

## 2016-11-22 DIAGNOSIS — A419 Sepsis, unspecified organism: Secondary | ICD-10-CM

## 2016-11-22 DIAGNOSIS — N183 Chronic kidney disease, stage 3 (moderate): Secondary | ICD-10-CM | POA: Diagnosis not present

## 2016-11-22 DIAGNOSIS — Z9889 Other specified postprocedural states: Secondary | ICD-10-CM | POA: Diagnosis not present

## 2016-11-22 DIAGNOSIS — R32 Unspecified urinary incontinence: Secondary | ICD-10-CM | POA: Diagnosis present

## 2016-11-22 DIAGNOSIS — Z885 Allergy status to narcotic agent status: Secondary | ICD-10-CM

## 2016-11-22 DIAGNOSIS — M19012 Primary osteoarthritis, left shoulder: Secondary | ICD-10-CM | POA: Diagnosis not present

## 2016-11-22 DIAGNOSIS — R1310 Dysphagia, unspecified: Secondary | ICD-10-CM | POA: Diagnosis present

## 2016-11-22 DIAGNOSIS — M545 Low back pain: Secondary | ICD-10-CM | POA: Diagnosis not present

## 2016-11-22 DIAGNOSIS — Z0389 Encounter for observation for other suspected diseases and conditions ruled out: Secondary | ICD-10-CM | POA: Diagnosis not present

## 2016-11-22 DIAGNOSIS — I10 Essential (primary) hypertension: Secondary | ICD-10-CM | POA: Diagnosis present

## 2016-11-22 DIAGNOSIS — J189 Pneumonia, unspecified organism: Secondary | ICD-10-CM

## 2016-11-22 DIAGNOSIS — R918 Other nonspecific abnormal finding of lung field: Secondary | ICD-10-CM | POA: Diagnosis not present

## 2016-11-22 DIAGNOSIS — R29898 Other symptoms and signs involving the musculoskeletal system: Secondary | ICD-10-CM

## 2016-11-22 DIAGNOSIS — M25519 Pain in unspecified shoulder: Secondary | ICD-10-CM

## 2016-11-22 DIAGNOSIS — R932 Abnormal findings on diagnostic imaging of liver and biliary tract: Secondary | ICD-10-CM | POA: Diagnosis not present

## 2016-11-22 DIAGNOSIS — Z808 Family history of malignant neoplasm of other organs or systems: Secondary | ICD-10-CM | POA: Diagnosis not present

## 2016-11-22 HISTORY — DX: Pneumonia, unspecified organism: J18.9

## 2016-11-22 LAB — URINALYSIS, ROUTINE W REFLEX MICROSCOPIC
BILIRUBIN URINE: NEGATIVE
Glucose, UA: NEGATIVE mg/dL
Ketones, ur: NEGATIVE mg/dL
LEUKOCYTES UA: NEGATIVE
NITRITE: NEGATIVE
PH: 5 (ref 5.0–8.0)
Protein, ur: 30 mg/dL — AB
SPECIFIC GRAVITY, URINE: 1.013 (ref 1.005–1.030)

## 2016-11-22 LAB — APTT: APTT: 28 s (ref 24–36)

## 2016-11-22 LAB — COMPREHENSIVE METABOLIC PANEL
ALK PHOS: 342 U/L — AB (ref 38–126)
ALT: 300 U/L — ABNORMAL HIGH (ref 17–63)
ANION GAP: 12 (ref 5–15)
AST: 960 U/L — ABNORMAL HIGH (ref 15–41)
Albumin: 3.8 g/dL (ref 3.5–5.0)
BILIRUBIN TOTAL: 3.1 mg/dL — AB (ref 0.3–1.2)
BUN: 19 mg/dL (ref 6–20)
CALCIUM: 8.8 mg/dL — AB (ref 8.9–10.3)
CO2: 19 mmol/L — ABNORMAL LOW (ref 22–32)
Chloride: 108 mmol/L (ref 101–111)
Creatinine, Ser: 1.49 mg/dL — ABNORMAL HIGH (ref 0.61–1.24)
GFR calc Af Amer: 48 mL/min — ABNORMAL LOW (ref >60)
GFR, EST NON AFRICAN AMERICAN: 41 mL/min — AB (ref >60)
GLUCOSE: 125 mg/dL — AB (ref 65–99)
Potassium: 4.1 mmol/L (ref 3.5–5.1)
Sodium: 139 mmol/L (ref 135–145)
TOTAL PROTEIN: 6.9 g/dL (ref 6.5–8.1)

## 2016-11-22 LAB — CBC WITH DIFFERENTIAL/PLATELET
Basophils Absolute: 0 10*3/uL (ref 0.0–0.1)
Basophils Relative: 0 %
Eosinophils Absolute: 0 10*3/uL (ref 0.0–0.7)
Eosinophils Relative: 0 %
HCT: 33.1 % — ABNORMAL LOW (ref 39.0–52.0)
HEMOGLOBIN: 11.5 g/dL — AB (ref 13.0–17.0)
LYMPHS ABS: 0.6 10*3/uL — AB (ref 0.7–4.0)
Lymphocytes Relative: 4 %
MCH: 33.4 pg (ref 26.0–34.0)
MCHC: 34.7 g/dL (ref 30.0–36.0)
MCV: 96.2 fL (ref 78.0–100.0)
MONO ABS: 0.7 10*3/uL (ref 0.1–1.0)
MONOS PCT: 5 %
NEUTROS PCT: 91 %
Neutro Abs: 11.4 10*3/uL — ABNORMAL HIGH (ref 1.7–7.7)
Platelets: 124 10*3/uL — ABNORMAL LOW (ref 150–400)
RBC: 3.44 MIL/uL — ABNORMAL LOW (ref 4.22–5.81)
RDW: 13.1 % (ref 11.5–15.5)
WBC: 12.6 10*3/uL — ABNORMAL HIGH (ref 4.0–10.5)

## 2016-11-22 LAB — PROTIME-INR
INR: 1.05
Prothrombin Time: 13.7 seconds (ref 11.4–15.2)

## 2016-11-22 LAB — ACETAMINOPHEN LEVEL: Acetaminophen (Tylenol), Serum: <10 ug/mL — ABNORMAL LOW (ref 10–30)

## 2016-11-22 LAB — I-STAT CG4 LACTIC ACID, ED
LACTIC ACID, VENOUS: 3.35 mmol/L — AB (ref 0.5–1.9)
LACTIC ACID, VENOUS: 2.02 mmol/L — AB (ref 0.5–1.9)

## 2016-11-22 LAB — AMMONIA: Ammonia: 35 umol/L (ref 9–35)

## 2016-11-22 LAB — LIPASE, BLOOD: LIPASE: 16 U/L (ref 11–51)

## 2016-11-22 LAB — ETHANOL: Alcohol, Ethyl (B): 5 mg/dL — ABNORMAL HIGH (ref <5)

## 2016-11-22 MED ORDER — IBUPROFEN 800 MG PO TABS: 800.0000 mg | ORAL_TABLET | Freq: Once | ORAL | Status: DC

## 2016-11-22 MED ORDER — SODIUM CHLORIDE 0.9 % IV BOLUS (SEPSIS)
1000.0000 mL | Freq: Once | INTRAVENOUS | Status: AC
  Administered 2016-11-22: 1000 mL via INTRAVENOUS

## 2016-11-22 MED ORDER — IBUPROFEN 200 MG PO TABS
400.0000 mg | ORAL_TABLET | Freq: Once | ORAL | Status: AC
  Administered 2016-11-22: 400 mg via ORAL
  Filled 2016-11-22: qty 2

## 2016-11-22 MED ORDER — IOPAMIDOL (ISOVUE-300) INJECTION 61%
INTRAVENOUS | Status: AC
  Administered 2016-11-22: 75 mL via INTRAVENOUS
  Filled 2016-11-22: qty 30

## 2016-11-22 MED ORDER — SODIUM CHLORIDE 0.9 % IJ SOLN
INTRAMUSCULAR | Status: AC
  Filled 2016-11-22: qty 50

## 2016-11-22 MED ORDER — IOPAMIDOL (ISOVUE-300) INJECTION 61%
INTRAVENOUS | Status: AC
  Administered 2016-11-22: 30 mL via ORAL
  Filled 2016-11-22: qty 75

## 2016-11-22 NOTE — ED Triage Notes (Signed)
Per [facility] pt from home, c/o abdominal pain when moving, denies tenderness or N/V/D. Pt wife reports pt couldn't get out of bed. Wife has dementia. Pt unable to tell what year it is but orientated to events.

## 2016-11-22 NOTE — ED Provider Notes (Signed)
[facility] DEPT Provider Note   CSN: [id number] Arrival date & time: 11/22/16  1744     History   Chief Complaint Chief Complaint  Patient presents with  . Abdominal Pain    HPI [name] [name] is a 83 y.o. male.  The history is provided by the patient and medical records.  Abdominal Pain   Associated symptoms include nausea and vomiting.    83 year old male with history of anemia, chronic renal insufficiency, dyslipidemia, hypertension, osteoporosis, presenting to the ED for abdominal pain. Patient's daughter is at the bedside who is assisting to provide history.  She reports pain is been ongoing and progressively worsening over the past 2 weeks. States they were seen by GI earlier in the week, Dr. Erlinda Hong office, and had some blood work done.  They were notified that there were some abnormalities in the liver function tests and a CT scan was being arranged for next Tuesday (5 days from now).  Daughter reports pain seems to be worsening and patient has become increasingly more weak. Reports he has had decreased oral intake over the past 2 days. He has had some intermittent vomiting, none in the past 24 hours. He has issues with chronic diarrhea, this is unchanged from baseline. Patient did have a low-grade fever here, no known fever at home.  Prior abdominal surgeries include hernia repair 2 as well as ERCP with stent placement.  Past Medical History:  Diagnosis Date  . Afebrile   . Anemia   . Chronic renal insufficiency   . Colitis   . Dyslipidemia   . Dysphagia   . ED (erectile dysfunction)   . Edema of extremities   . Hiatus hernia syndrome   . Hypertension    systolic  . Inguinal hernia   . Microhematuria   . Onychomycosis   . Osteoporosis   . Pleural effusion   . Prostate cancer ([place])   . Shingles   . Syncope 2012    Patient Active Problem List   Diagnosis Date Noted  . CKD (chronic kidney disease) stage 3, GFR 30-59 ml/min 09/02/2015  .  Hyperbilirubinemia 09/02/2015  . Transaminitis 09/01/2015  . Hyponatremia 09/01/2015  . CKD (chronic kidney disease) 09/01/2015  . Macular degeneration 09/01/2015  . Urinary incontinence 09/01/2015  . PROSTATE CANCER 06/23/2007  . HYPERLIPIDEMIA 06/23/2007  . DEPRESSION 06/23/2007  . Essential hypertension 06/23/2007  . PLEURAL EFFUSION 06/23/2007  . OSTEOARTHRITIS 06/23/2007  . SKIN CANCER, HX OF 06/23/2007    Past Surgical History:  Procedure Laterality Date  . ERCP N/A 09/06/2015   Procedure: ENDOSCOPIC RETROGRADE CHOLANGIOPANCREATOGRAPHY (ERCP);  Surgeon: Arta Silence, MD;  Location: [facility];  Service: Endoscopy;  Laterality: N/A;  . EUS Left 09/06/2015   Procedure: UPPER ENDOSCOPIC ULTRASOUND (EUS) LINEAR;  Surgeon: Arta Silence, MD;  Location: [facility];  Service: Endoscopy;  Laterality: Left;  . TRANSPERINEAL IMPLANT OF RADIATION SEEDS W/ ULTRASOUND  2001       Home Medications    Prior to Admission medications   Medication Sig Start Date End Date Taking? Authorizing Provider  acetaminophen (TYLENOL) 500 MG tablet Take 1,000 mg by mouth every 6 (six) hours as needed for mild pain, moderate pain or headache.    Yes Historical Provider, MD  calcium carbonate (OSCAL) 1500 (600 Ca) MG TABS tablet Take 600 mg of elemental calcium by mouth daily with breakfast.   Yes Historical Provider, MD  Multiple Vitamin (MULTIVITAMIN WITH MINERALS) TABS tablet Take 1 tablet by mouth daily.   Yes  Historical Provider, MD  Multiple Vitamins-Minerals (PRESERVISION AREDS 2+MULTI VIT) CAPS Take 1 capsule by mouth daily.   Yes Historical Provider, MD  MYRBETRIQ 50 MG TB24 tablet Take 50 mg by mouth daily.   Yes Historical Provider, MD  omeprazole (PRILOSEC) 40 MG capsule Take 40 mg by mouth daily.   Yes Historical Provider, MD  Polyethyl Glycol-Propyl Glycol (SYSTANE) 0.4-0.3 % SOLN Place 1 drop into both eyes 2 (two) times daily.   Yes Historical Provider, MD    Family  History Family History  Problem Relation Age of Onset  . Throat cancer Mother   . Lung cancer Brother   . Bone cancer Brother     Social History Social History  Substance Use Topics  . Smoking status: Former Smoker    Quit date: 10/07/1952  . Smokeless tobacco: Never Used  . Alcohol use 8.4 oz/week    14 Standard drinks or equivalent per week     Comment: daily, wine/beer q day, 0.5 bottle wine/day     Allergies   Penicillins and Oxycontin [oxycodone hcl]   Review of Systems Review of Systems  Gastrointestinal: Positive for abdominal pain, nausea and vomiting.  Neurological: Positive for weakness (generalized).  All other systems reviewed and are negative.    Physical Exam Updated Vital Signs BP 124/71 (BP Location: Right Arm)   Pulse 106   Temp 100.2 F (37.9 C) (Oral)   Resp 20   Ht 5' 10"  (1.778 m)   Wt 76.7 kg   SpO2 96%   BMI 24.25 kg/m   Physical Exam  Constitutional: He is oriented to person, place, and time. He appears well-developed and well-nourished.  HENT:  Head: Normocephalic and atraumatic.  Mouth/Throat: Oropharynx is clear and moist.  Eyes: Conjunctivae and EOM are normal. Pupils are equal, round, and reactive to light.  No scleral icterus  Neck: Normal range of motion.  Cardiovascular: Normal rate, regular rhythm and normal heart sounds.   Pulmonary/Chest: Effort normal and breath sounds normal. No respiratory distress. He has no wheezes.  Abdominal: Soft. Bowel sounds are normal. There is tenderness in the right upper quadrant and epigastric area. There is no rebound.  Mildly tender in the epigastrium and RUQ, no rebound or guarding, no significant distention, normal bowel sounds  Musculoskeletal: Normal range of motion.  Neurological: He is alert and oriented to person, place, and time.  Skin: Skin is warm and dry.  No jaundice  Psychiatric: He has a normal mood and affect.  Nursing note and vitals reviewed.    ED Treatments / Results   Labs (all labs ordered are listed, but only abnormal results are displayed) Labs Reviewed  CBC WITH DIFFERENTIAL/PLATELET - Abnormal; Notable for the following:       Result Value   WBC 12.6 (*)    RBC 3.44 (*)    Hemoglobin 11.5 (*)    HCT 33.1 (*)    Platelets 124 (*)    Neutro Abs 11.4 (*)    Lymphs Abs 0.6 (*)    All other components within normal limits  COMPREHENSIVE METABOLIC PANEL - Abnormal; Notable for the following:    CO2 19 (*)    Glucose, Bld 125 (*)    Creatinine, Ser 1.49 (*)    Calcium 8.8 (*)    AST 960 (*)    ALT 300 (*)    Alkaline Phosphatase 342 (*)    Total Bilirubin 3.1 (*)    GFR calc non Af Amer 41 (*)  GFR calc Af Amer 48 (*)    All other components within normal limits  URINALYSIS, ROUTINE W REFLEX MICROSCOPIC - Abnormal; Notable for the following:    Hgb urine dipstick LARGE (*)    Protein, ur 30 (*)    Bacteria, UA RARE (*)    Squamous Epithelial / LPF 0-5 (*)    All other components within normal limits  I-STAT CG4 LACTIC ACID, ED - Abnormal; Notable for the following:    Lactic Acid, Venous 3.35 (*)    All other components within normal limits  LIPASE, BLOOD  AMMONIA  PROTIME-INR  APTT  I-STAT CG4 LACTIC ACID, ED    EKG  EKG Interpretation None       Radiology Ct Abdomen Pelvis W Wo Contrast  Result Date: 11/22/2016 CLINICAL DATA:  History of common bile duct stricture and potential pancreatic head mass. Common bile duct stent placed 09/06/2015. EXAM: CT ABDOMEN AND PELVIS WITHOUT AND WITH CONTRAST TECHNIQUE: Multidetector CT imaging of the abdomen and pelvis was performed following the standard protocol before and following the bolus administration of intravenous contrast. CONTRAST:  1 ISOVUE-300 IOPAMIDOL (ISOVUE-300) INJECTION 61%, 1 ISOVUE-300 IOPAMIDOL (ISOVUE-300) INJECTION 61% COMPARISON:  PET-CT 10/24/2015 FINDINGS: Lower chest:  Basilar atelectasis bilaterally. Hepatobiliary: The liver shows diffusely decreased  attenuation suggesting steatosis. No focal abnormality within the liver parenchyma. No substantial intrahepatic biliary duct dilatation. Pneumobilia is compatible with the presence of the common bile duct stent. Gallbladder unremarkable. Metallic common bile duct stent appears appropriately positioned. Pancreas: Soft tissue fullness noted in the uncinate process of the pancreas, more pronounced then on prior imaging studies. Soft tissue now appears more masslike and measures 2.6 x 3.4 cm. Diffuse dilatation of the main pancreatic duct is new since PET-CT of 10/24/2015. Spleen: No splenomegaly. No focal mass lesion. Adrenals/Urinary Tract: No adrenal nodule or mass. Cortical thinning noted both kidneys. No hydronephrosis. Stomach/Bowel: Small hiatal hernia. Stomach otherwise unremarkable. Duodenal wall thickening noted in the region of the stent device. Visualize small bowel loops and colonic segments of the abdomen are unremarkable. Vascular/Lymphatic: There is abdominal aortic atherosclerosis without aneurysm. There is no gastrohepatic or hepatoduodenal ligament lymphadenopathy. No intraperitoneal or retroperitoneal lymphadenopathy. Other: No intraperitoneal free fluid. Musculoskeletal: Old rib fractures are noted bilaterally. IMPRESSION: 1. Progression of soft tissue fullness in the uncinate process of the pancreas, now appearing more masslike and measuring 2.6 x 3.4 cm. 2. Interval development of diffuse dilatation main pancreatic duct. 3. Metallic common bile duct stent appears appropriately positioned without substantial intra or extrahepatic biliary duct dilatation. Pneumobilia is associated. 4.  Abdominal Aortic Atherosclerois (ICD10-170.0) Electronically Signed   By: Misty Stanley M.D.   On: 11/22/2016 21:51    Procedures Procedures (including critical care time)  Medications Ordered in ED Medications - No data to display   Initial Impression / Assessment and Plan / ED Course  I have reviewed the  triage vital signs and the nursing notes.  Pertinent labs & imaging results that were available during my care of the patient were reviewed by me and considered in my medical decision making (see chart for details).  83 year old male here with abdominal pain. Evaluated by GI earlier in the week and had lab work done which revealed some elevated liver enzymes.  Was scheduled for CT next week, however pain worsened so family brought here. Patient with low-grade fever but non-toxic in appearance.  He does not appear jaundiced.  Mild tenderness in the epigastrium and right upper quadrant without rebound or  guarding. On chart review, patient has had prior CT and PET scans which are concerning for possible pancreatic neoplasm, family reports this was under surveillance by GI. Well plan for repeat labs and CT scan today.    Labs today with leukocytosis and significant elevation of LFT's and alk phos.  Lipase WNL. Mild elevation of lactate-- IVF given.  Patient continues to appear well, requesting to eat and drink.  Kept NPO for CT which unfortunately reveals progression of area of tissue on head of pancreas which was of concern before, now more mass-like in structure.  There is also some dilation of pancreatic duct as well.  Results discussed with family, they acknowledged understanding.  Will plan for hospitalist admission.  Will need GI input as well.  Discussed with hospitalist, Dr. Eulas Post--- will admit for ongoing care.  GI consulted-- spoke with Dr. Watt Climes, patient will be seen in the morning.  Final Clinical Impressions(s) / ED Diagnoses   Final diagnoses:  Pancreatic mass  Transaminitis  Elevated alkaline phosphatase level    New Prescriptions New Prescriptions   No medications on file     Larene Pickett, PA-C 11/22/16 [place], MD 11/23/16 [phone]

## 2016-11-22 NOTE — ED Notes (Signed)
Bed: WA02 Expected date:  Expected time:  Means of arrival:  Comments: EMS 

## 2016-11-22 NOTE — H&P (Addendum)
History and Physical    HOSEY BURMESTER KDX:[id number] DOB: [birth year] DOA: 11/22/2016  PCP: Limmie Patricia, MD  [facility]  Patient coming from: Home  Chief Complaint: Weakness, abdominal pain  HPI: [name] [name] is a 83 y.o. gentleman with a history of HTN, HLD, CKD 3, chronic anemia, GERD, and biliary obstruction requiring CBD stent in November 2016.  The patient has been in his baseline state of health until the past two weeks.  He has had recurrent epigastric pain associated with intermittent nausea and vomiting.  He has been taking acetaminophen, but it has not helped his pain.  He has had decreased appetite and has lost 5 lbs in the past two weeks.  No fevers, chills, or sweats.  No syncope.  Today, he had a self-limited episode of what he describes as left leg numbness and weakness ("I could not move it.")  He was unable to get up from his bed, even with assistance, which is atypical for him.  He did not fall.  His wife called the neighbors, who ultimately notified his daughter.  The patient was brought in by EMS for further evaluation.  No witnessed slurred speech or facial droop.  No difficulty swallowing.  Left leg symptoms resolved spontaneously, without specific intervention.  He has urinary incontinence at baseline; wears depends.  No appreciable difference in urine output.  No odor.  ED Course: The patient had an oral temp of 100.2 in the ED.  HR 106.  Lactic acid level 3.35.  WBC count elevated to 12.6.  Bicarb 19.  Creatinine 1.49 which actually appears better than his baseline of 2.  LFTs have been elevated in the past but AST higher than previous at 960.  ALT 300.  Alk phos 342.  T bil 3.1 (previously 24).  CT of the abdomen and pelvis shows progression of soft tissue fullness in the uncinate process of the pancrease (now has a more apparent 3 cm mass).  Biliary stent remains in place, but he now has diffuse dilatation of the main pancreatic duct as well.  He  received 1L of NS and a small dose of motrin (432m).  Hospitalist asked to admit.  [facility] consult requested from the ED.    Review of Systems: As per HPI otherwise 10 point review of systems negative.    Past Medical History:  Diagnosis Date  . Afebrile   . Anemia   . Chronic renal insufficiency   . Colitis   . Dyslipidemia   . Dysphagia   . ED (erectile dysfunction)   . Edema of extremities   . Hiatus hernia syndrome   . Hypertension    systolic  . Inguinal hernia   . Microhematuria   . Onychomycosis   . Osteoporosis   . Pleural effusion   . Prostate cancer ([place]   . Shingles   . Syncope 2012    Past Surgical History:  Procedure Laterality Date  . ERCP N/A 09/06/2015   Procedure: ENDOSCOPIC RETROGRADE CHOLANGIOPANCREATOGRAPHY (ERCP);  Surgeon: WArta Silence MD;  [facility];  Service: Endoscopy;  Laterality: N/A;  . EUS Left 09/06/2015   Procedure: UPPER ENDOSCOPIC ULTRASOUND (EUS) LINEAR;  Surgeon: WArta Silence MD;  Location: [facility];  Service: Endoscopy;  Laterality: Left;  . TRANSPERINEAL IMPLANT OF RADIATION SEEDS W/ ULTRASOUND  2001     reports that he quit smoking about 64 years ago. He has never used smokeless tobacco. He reports that he drinks about 8.4 oz of alcohol  per week . He reports that he does not use drugs.  He still drinks at least 2 beers daily.  Family denies a history of EtOH withdrawal.  Allergies  Allergen Reactions  . Penicillins Hives and Other (See Comments)    Has patient had a PCN reaction causing immediate rash, facial/tongue/throat swelling, SOB or lightheadedness with hypotension: No Has patient had a PCN reaction causing severe rash involving mucus membranes or skin necrosis: No Has patient had a PCN reaction that required hospitalization No Has patient had a PCN reaction occurring within the last 10 years: No If all of the above answers are "NO", then may proceed with Cephalosporin use.  Marland Kitchen Oxycontin [Oxycodone  Hcl] Other (See Comments)    Reaction:  Altered mental status     Family History  Problem Relation Age of Onset  . Throat cancer Mother   . Lung cancer Brother   . Bone cancer Brother      Prior to Admission medications   Medication Sig Start Date End Date Taking? Authorizing Provider  acetaminophen (TYLENOL) 500 MG tablet Take 1,000 mg by mouth every 6 (six) hours as needed for mild pain, moderate pain or headache.    Yes Historical Provider, MD  calcium carbonate (OSCAL) 1500 (600 Ca) MG TABS tablet Take 600 mg of elemental calcium by mouth daily with breakfast.   Yes Historical Provider, MD  Multiple Vitamin (MULTIVITAMIN WITH MINERALS) TABS tablet Take 1 tablet by mouth daily.   Yes Historical Provider, MD  Multiple Vitamins-Minerals (PRESERVISION AREDS 2+MULTI VIT) CAPS Take 1 capsule by mouth daily.   Yes Historical Provider, MD  MYRBETRIQ 50 MG TB24 tablet Take 50 mg by mouth daily.   Yes Historical Provider, MD  omeprazole (PRILOSEC) 40 MG capsule Take 40 mg by mouth daily.   Yes Historical Provider, MD  Polyethyl Glycol-Propyl Glycol (SYSTANE) 0.4-0.3 % SOLN Place 1 drop into both eyes 2 (two) times daily.   Yes Historical Provider, MD    Physical Exam: Vitals:   11/22/16 2030 11/22/16 2035 11/22/16 2100 11/22/16 2206  BP: 143/67 143/67 139/69 140/92  Pulse: 101 103 103 99  Resp: 26 14 20 21   Temp:      TempSrc:      SpO2: 95% 96% 97% 97%  Weight:      Height:          Constitutional: NAD, calm, comfortable, elderly and frail appearing but not acutely decompensating Vitals:   11/22/16 2030 11/22/16 2035 11/22/16 2100 11/22/16 2206  BP: 143/67 143/67 139/69 140/92  Pulse: 101 103 103 99  Resp: 26 14 20 21   Temp:      TempSrc:      SpO2: 95% 96% 97% 97%  Weight:      Height:       Eyes: PERRL, lids and conjunctivae normal, no scleral icterus ENMT: Mucous membranes are moist. Posterior pharynx clear of any exudate or lesions. Normal dentition.  Neck: normal  appearance, supple, no masses Respiratory: clear to auscultation bilaterally, no wheezing, no crackles. Normal respiratory effort. No accessory muscle use.  Cardiovascular: Normal rate, regular rhythm, no murmurs / rubs / gallops. 1-2+ pitting edema bilaterally.  2+ pedal pulses. No carotid bruits.  GI: abdomen is soft and compressible.  No distention but he has guarding and tenderness to palpation in his RUQ and epigastric area.  No masses palpated.  Bowel sounds are present. Musculoskeletal:  No joint deformity in upper and lower extremities. Good ROM, no contractures. Normal muscle  tone.  Skin: no rashes, warm and dry Neurologic: CN 2-12 grossly intact. Sensation intact, Strength symmetric bilaterally, 5/5. Psychiatric: Normal judgment and insight.  Normal mood.  Some confusion at baseline.    Labs on Admission: I have personally reviewed following labs and imaging studies  CBC:  Recent Labs Lab 11/22/16 1915  WBC 12.6*  NEUTROABS 11.4*  HGB 11.5*  HCT 33.1*  MCV 96.2  PLT 193*   Basic Metabolic Panel:  Recent Labs Lab 11/22/16 1915  NA 139  K 4.1  CL 108  CO2 19*  GLUCOSE 125*  BUN 19  CREATININE 1.49*  CALCIUM 8.8*   GFR: Estimated Creatinine Clearance: 37.4 mL/min (by C-G formula based on SCr of 1.49 mg/dL (H)). Liver Function Tests:  Recent Labs Lab 11/22/16 1915  AST 960*  ALT 300*  ALKPHOS 342*  BILITOT 3.1*  PROT 6.9  ALBUMIN 3.8    Recent Labs Lab 11/22/16 1915  LIPASE 16    Recent Labs Lab 11/22/16 1915  AMMONIA 35   Coagulation Profile:  Recent Labs Lab 11/22/16 1915  INR 1.05   Urine analysis:    Component Value Date/Time   COLORURINE YELLOW 11/22/2016 2052   APPEARANCEUR CLEAR 11/22/2016 2052   LABSPEC 1.013 11/22/2016 2052   PHURINE 5.0 11/22/2016 2052   GLUCOSEU NEGATIVE 11/22/2016 2052   HGBUR LARGE (A) 11/22/2016 2052   [place] NEGATIVE 11/22/2016 2052   [place] NEGATIVE 11/22/2016 2052   PROTEINUR 30 (A)  11/22/2016 2052   NITRITE NEGATIVE 11/22/2016 2052   LEUKOCYTESUR NEGATIVE 11/22/2016 2052   Sepsis Labs:  Lactic acid level 3.35  Radiological Exams on Admission: Ct Abdomen Pelvis W Wo Contrast  Result Date: 11/22/2016 CLINICAL DATA:  History of common bile duct stricture and potential pancreatic head mass. Common bile duct stent placed 09/06/2015. EXAM: CT ABDOMEN AND PELVIS WITHOUT AND WITH CONTRAST TECHNIQUE: Multidetector CT imaging of the abdomen and pelvis was performed following the standard protocol before and following the bolus administration of intravenous contrast. CONTRAST:  1 ISOVUE-300 IOPAMIDOL (ISOVUE-300) INJECTION 61%, 1 ISOVUE-300 IOPAMIDOL (ISOVUE-300) INJECTION 61% COMPARISON:  PET-CT 10/24/2015 FINDINGS: Lower chest:  Basilar atelectasis bilaterally. Hepatobiliary: The liver shows diffusely decreased attenuation suggesting steatosis. No focal abnormality within the liver parenchyma. No substantial intrahepatic biliary duct dilatation. Pneumobilia is compatible with the presence of the common bile duct stent. Gallbladder unremarkable. Metallic common bile duct stent appears appropriately positioned. Pancreas: Soft tissue fullness noted in the uncinate process of the pancreas, more pronounced then on prior imaging studies. Soft tissue now appears more masslike and measures 2.6 x 3.4 cm. Diffuse dilatation of the main pancreatic duct is new since PET-CT of 10/24/2015. Spleen: No splenomegaly. No focal mass lesion. Adrenals/Urinary Tract: No adrenal nodule or mass. Cortical thinning noted both kidneys. No hydronephrosis. Stomach/Bowel: Small hiatal hernia. Stomach otherwise unremarkable. Duodenal wall thickening noted in the region of the stent device. Visualize small bowel loops and colonic segments of the abdomen are unremarkable. Vascular/Lymphatic: There is abdominal aortic atherosclerosis without aneurysm. There is no gastrohepatic or hepatoduodenal ligament lymphadenopathy. No  intraperitoneal or retroperitoneal lymphadenopathy. Other: No intraperitoneal free fluid. Musculoskeletal: Old rib fractures are noted bilaterally. IMPRESSION: 1. Progression of soft tissue fullness in the uncinate process of the pancreas, now appearing more masslike and measuring 2.6 x 3.4 cm. 2. Interval development of diffuse dilatation main pancreatic duct. 3. Metallic common bile duct stent appears appropriately positioned without substantial intra or extrahepatic biliary duct dilatation. Pneumobilia is associated. 4.  Abdominal Aortic Atherosclerois (  ICD10-170.0) Electronically Signed   By: Misty Stanley M.D.   On: 11/22/2016 21:51    EKG: Independently reviewed.  NSR.  No acute ST segment changes.  Assessment/Plan Principal Problem:   Sepsis ([place]) Active Problems:   Essential hypertension   Transaminitis   CKD (chronic kidney disease)   Urinary incontinence   Hyperbilirubinemia   History of biliary stent insertion   Pancreatic mass   Left leg weakness      Abdominal pain with low grade fever and abnormal LFTs.  Pancreatic mass on CT.  Biliary stent also in place. --Cipro and flagyl since ascending cholangitis cannot be ruled out in this context. --[facility] should see in the AM --Repeat CMP in the AM --NPO after midnight --Avoid acetaminophen; will check acetaminophen and EtOH levels. --NSAIDs sparingly for fever since he has CKD  Possible sepsis with low grade fever, tachycardia, elevated lactic acid level, and leukocytosis.  Ascending cholangitis would be possible source in this context. --Will add blood cultures --Urine does not appear to be infected. --Empiric cipro and flagyl for now  Left leg weakness, per patient.  Self-limited.  Resolved now. --Telemetry monitoring --EKG now --Head CT now --Neuro checks --PT/OT eval and treat --Fall precautions --Can consider additional imaging, if indicated, in the AM  Daily EtOH use --EtOH cessation advised due to abnormal  LFTs --CIWA protocol --Folate, thiamine, MVI  Urinary incontinence --Myrbetriq  GERD --PPI  DVT prophylaxis: SCDs Code Status: FULL Family Communication: Patient's wife, daughter present in the ED at time of admission. Disposition Plan: To be determined. Consults called: [facility] called by the ED attending. Admission status: Inpatient with telemetry monitoring.  I expect this patient to need inpatient services for greater than two midnights.   TIME SPENT: 70 minutes   Eber Jones MD [facility] Pager [phone]  If 7PM-7AM, please contact night-coverage [email or web address] Password [email or web address]  11/22/2016, 11:14 PM

## 2016-11-23 ENCOUNTER — Encounter (HOSPITAL_COMMUNITY): Payer: Self-pay | Admitting: Gastroenterology

## 2016-11-23 DIAGNOSIS — R29898 Other symptoms and signs involving the musculoskeletal system: Secondary | ICD-10-CM

## 2016-11-23 LAB — BLOOD CULTURE ID PANEL (REFLEXED)
Acinetobacter baumannii: NOT DETECTED
CANDIDA KRUSEI: NOT DETECTED
CANDIDA PARAPSILOSIS: NOT DETECTED
CARBAPENEM RESISTANCE: NOT DETECTED
Candida albicans: NOT DETECTED
Candida glabrata: NOT DETECTED
Candida tropicalis: NOT DETECTED
ENTEROBACTERIACEAE SPECIES: DETECTED — AB
ENTEROCOCCUS SPECIES: NOT DETECTED
Enterobacter cloacae complex: NOT DETECTED
Escherichia coli: DETECTED — AB
HAEMOPHILUS INFLUENZAE: NOT DETECTED
KLEBSIELLA OXYTOCA: NOT DETECTED
KLEBSIELLA PNEUMONIAE: NOT DETECTED
LISTERIA MONOCYTOGENES: NOT DETECTED
Neisseria meningitidis: NOT DETECTED
PSEUDOMONAS AERUGINOSA: NOT DETECTED
Proteus species: NOT DETECTED
SERRATIA MARCESCENS: NOT DETECTED
STAPHYLOCOCCUS AUREUS BCID: NOT DETECTED
STAPHYLOCOCCUS SPECIES: NOT DETECTED
STREPTOCOCCUS PYOGENES: NOT DETECTED
Streptococcus agalactiae: NOT DETECTED
Streptococcus pneumoniae: NOT DETECTED
Streptococcus species: NOT DETECTED

## 2016-11-23 LAB — CBC WITH DIFFERENTIAL/PLATELET
BASOS ABS: 0 10*3/uL (ref 0.0–0.1)
Basophils Relative: 0 %
Eosinophils Absolute: 0 10*3/uL (ref 0.0–0.7)
Eosinophils Relative: 0 %
HEMATOCRIT: 31.4 % — AB (ref 39.0–52.0)
HEMOGLOBIN: 10.5 g/dL — AB (ref 13.0–17.0)
LYMPHS ABS: 1.2 10*3/uL (ref 0.7–4.0)
Lymphocytes Relative: 9 %
MCH: 32.2 pg (ref 26.0–34.0)
MCHC: 33.4 g/dL (ref 30.0–36.0)
MCV: 96.3 fL (ref 78.0–100.0)
Monocytes Absolute: 0.7 10*3/uL (ref 0.1–1.0)
Monocytes Relative: 5 %
NEUTROS ABS: 11.4 10*3/uL — AB (ref 1.7–7.7)
Neutrophils Relative %: 86 %
Platelets: 121 10*3/uL — ABNORMAL LOW (ref 150–400)
RBC: 3.26 MIL/uL — AB (ref 4.22–5.81)
RDW: 13.2 % (ref 11.5–15.5)
WBC: 13.3 10*3/uL — AB (ref 4.0–10.5)

## 2016-11-23 LAB — COMPREHENSIVE METABOLIC PANEL
ALK PHOS: 321 U/L — AB (ref 38–126)
ALT: 301 U/L — AB (ref 17–63)
AST: 828 U/L — AB (ref 15–41)
Albumin: 3.6 g/dL (ref 3.5–5.0)
Anion gap: 6 (ref 5–15)
BUN: 19 mg/dL (ref 6–20)
CALCIUM: 8.4 mg/dL — AB (ref 8.9–10.3)
CO2: 25 mmol/L (ref 22–32)
CREATININE: 1.43 mg/dL — AB (ref 0.61–1.24)
Chloride: 107 mmol/L (ref 101–111)
GFR, EST AFRICAN AMERICAN: 50 mL/min — AB (ref >60)
GFR, EST NON AFRICAN AMERICAN: 43 mL/min — AB (ref >60)
Glucose, Bld: 137 mg/dL — ABNORMAL HIGH (ref 65–99)
Potassium: 4.4 mmol/L (ref 3.5–5.1)
Sodium: 138 mmol/L (ref 135–145)
Total Bilirubin: 3.9 mg/dL — ABNORMAL HIGH (ref 0.3–1.2)
Total Protein: 6.8 g/dL (ref 6.5–8.1)

## 2016-11-23 LAB — PROCALCITONIN: Procalcitonin: 3.17 ng/mL

## 2016-11-23 LAB — LACTIC ACID, PLASMA: Lactic Acid, Venous: 1.2 mmol/L (ref 0.5–1.9)

## 2016-11-23 MED ORDER — THIAMINE HCL 100 MG/ML IJ SOLN: 100.0000 mg | Freq: Every day | INTRAMUSCULAR | Status: DC

## 2016-11-23 MED ORDER — FOLIC ACID 1 MG PO TABS
1.0000 mg | ORAL_TABLET | Freq: Every day | ORAL | Status: DC
  Administered 2016-11-23 – 2016-11-27 (×5): 1 mg via ORAL
  Filled 2016-11-23 (×5): qty 1

## 2016-11-23 MED ORDER — VITAMIN B-1 100 MG PO TABS
100.0000 mg | ORAL_TABLET | Freq: Every day | ORAL | Status: DC
  Administered 2016-11-23 – 2016-11-27 (×5): 100 mg via ORAL
  Filled 2016-11-23 (×5): qty 1

## 2016-11-23 MED ORDER — ADULT MULTIVITAMIN W/MINERALS CH
1.0000 | ORAL_TABLET | Freq: Every day | ORAL | Status: DC
  Administered 2016-11-23 – 2016-11-27 (×5): 1 via ORAL
  Filled 2016-11-23 (×5): qty 1

## 2016-11-23 MED ORDER — ONDANSETRON HCL 4 MG/2ML IJ SOLN: 4.0000 mg | Freq: Four times a day (QID) | INTRAMUSCULAR | Status: DC | PRN

## 2016-11-23 MED ORDER — POLYVINYL ALCOHOL 1.4 % OP SOLN
1.0000 [drp] | Freq: Two times a day (BID) | OPHTHALMIC | Status: DC
  Administered 2016-11-23 – 2016-11-27 (×10): 1 [drp] via OPHTHALMIC
  Filled 2016-11-23: qty 15

## 2016-11-23 MED ORDER — TRAMADOL HCL 50 MG PO TABS
50.0000 mg | ORAL_TABLET | Freq: Four times a day (QID) | ORAL | Status: DC | PRN
  Administered 2016-11-24 – 2016-11-26 (×3): 50 mg via ORAL
  Filled 2016-11-23 (×3): qty 1

## 2016-11-23 MED ORDER — PANTOPRAZOLE SODIUM 40 MG PO TBEC
40.0000 mg | DELAYED_RELEASE_TABLET | Freq: Every day | ORAL | Status: DC
  Administered 2016-11-23 – 2016-11-27 (×5): 40 mg via ORAL
  Filled 2016-11-23 (×5): qty 1

## 2016-11-23 MED ORDER — CIPROFLOXACIN IN D5W 400 MG/200ML IV SOLN
400.0000 mg | Freq: Two times a day (BID) | INTRAVENOUS | Status: DC
  Administered 2016-11-23 (×3): 400 mg via INTRAVENOUS
  Filled 2016-11-23 (×3): qty 200

## 2016-11-23 MED ORDER — SODIUM CHLORIDE 0.9 % IV SOLN
INTRAVENOUS | Status: AC
  Administered 2016-11-23: 02:00:00 via INTRAVENOUS

## 2016-11-23 MED ORDER — LORAZEPAM 2 MG/ML IJ SOLN
1.0000 mg | Freq: Four times a day (QID) | INTRAMUSCULAR | Status: AC | PRN
  Administered 2016-11-24 – 2016-11-25 (×2): 1 mg via INTRAVENOUS
  Filled 2016-11-23 (×2): qty 1

## 2016-11-23 MED ORDER — MIRABEGRON ER 50 MG PO TB24
50.0000 mg | ORAL_TABLET | Freq: Every day | ORAL | Status: DC
  Administered 2016-11-23 – 2016-11-27 (×5): 50 mg via ORAL
  Filled 2016-11-23 (×5): qty 1

## 2016-11-23 MED ORDER — DEXTROSE 5 % IV SOLN
2.0000 g | INTRAVENOUS | Status: DC
  Administered 2016-11-23 – 2016-11-25 (×3): 2 g via INTRAVENOUS
  Filled 2016-11-23 (×4): qty 2

## 2016-11-23 MED ORDER — LORAZEPAM 1 MG PO TABS: 1.0000 mg | ORAL_TABLET | Freq: Four times a day (QID) | ORAL | Status: AC | PRN

## 2016-11-23 MED ORDER — METRONIDAZOLE IN NACL 5-0.79 MG/ML-% IV SOLN
500.0000 mg | Freq: Three times a day (TID) | INTRAVENOUS | Status: DC
  Administered 2016-11-23 – 2016-11-26 (×11): 500 mg via INTRAVENOUS
  Filled 2016-11-23 (×13): qty 100

## 2016-11-23 NOTE — Progress Notes (Signed)
Pharmacy Antibiotic Note  [name] [name] is a 83 y.o. male admitted on 11/22/2016 with Intra-abdominal infection .  Pharmacy has been consulted for Ciprofloxacin dosing.  Plan: Ciprofloxacin 400mg  iv q12hr  Height: 5\' 10"  (177.8 cm) Weight: 169 lb (76.7 kg) IBW/kg (Calculated) : 73  Temp (24hrs), Avg:98.7 F (37.1 C), Min:98 F (36.7 C), Max:100.2 F (37.9 C)   Recent Labs Lab 11/22/16 1915 11/22/16 1934 11/22/16 2326  WBC 12.6*  --   --   CREATININE 1.49*  --   --   LATICACIDVEN  --  3.35* 2.02*    Estimated Creatinine Clearance: 37.4 mL/min (by C-G formula based on SCr of 1.49 mg/dL (H)).    Allergies  Allergen Reactions  . Penicillins Hives and Other (See Comments)    Has patient had a PCN reaction causing immediate rash, facial/tongue/throat swelling, SOB or lightheadedness with hypotension: No Has patient had a PCN reaction causing severe rash involving mucus membranes or skin necrosis: No Has patient had a PCN reaction that required hospitalization No Has patient had a PCN reaction occurring within the last 10 years: No If all of the above answers are "NO", then may proceed with Cephalosporin use.  Marland Kitchen Oxycontin [Oxycodone Hcl] Other (See Comments)    Reaction:  Altered mental status     Antimicrobials this admission: Ciprofloxacin 2/17 >> Flagyl 2/17 >>  Dose adjustments this admission: -  Microbiology results: pending  Thank you for allowing pharmacy to be a part of this patient's care.  Nani Skillern Crowford 11/23/2016 1:40 AM

## 2016-11-23 NOTE — Progress Notes (Signed)
PROGRESS NOTE    [name] [name]  [name] DOB: September 10, 1929 DOA: 11/22/2016 PCP: Limmie Patricia, MD    Brief Narrative:  83 y.o. gentleman with a history of HTN, HLD, CKD 3, chronic anemia, GERD, and biliary obstruction requiring CBD stent in November 2016.  The patient has been in his baseline state of health until the past two weeks.  He has had recurrent epigastric pain associated with intermittent nausea and vomiting.  He has been taking acetaminophen, but it has not helped his pain.  He has had decreased appetite and has lost 5 lbs in the past two weeks.  No fevers, chills, or sweats.  No syncope.  Today, he had a self-limited episode of what he describes as left leg numbness and weakness ("I could not move it.")  He was unable to get up from his bed, even with assistance, which is atypical for him.  He did not fall.  His wife called the neighbors, who ultimately notified his daughter.  The patient was brought in by EMS for further evaluation.  No witnessed slurred speech or facial droop.  No difficulty swallowing.  Left leg symptoms resolved spontaneously, without specific intervention.  He has urinary incontinence at baseline; wears depends.  No appreciable difference in urine output.  No odor  Assessment & Plan:   Principal Problem:   Sepsis ([facility]) Active Problems:   Essential hypertension   Transaminitis   CKD (chronic kidney disease)   Urinary incontinence   Hyperbilirubinemia   History of biliary stent insertion   Pancreatic mass   Left leg weakness  Abdominal pain with low grade fever and abnormal LFTs.   -- Pancreatic mass noted on CT.  Biliary stent remains in place. --Cipro and flagyl has been started empirically. --mild leukocytosis of 13.3 today. --repeat CBC in AM -GI consulted. Consideration for repeat ERCP vs EUS with biopsy vs abx alone  Possible sepsis with low grade fever, tachycardia, elevated lactic acid level, and leukocytosis. --blood cx  pending --Urinalysis unremarkable --Empiric cipro and flagyl per above  Left leg weakness, per patient.  Self-limited.  Resolved now. --Head CT unremakrable --PT/OT eval and treat --Fall precautions --Stable at present  Daily EtOH use --EtOH cessation advised due to abnormal LFTs --CIWA protocol continued --Folate, thiamine, MVI  Urinary incontinence --Myrbetriq - Stable  GERD --PPI - Stable  DVT prophylaxis: SCd's Code Status: Full Family Communication: Pt in room, family at bedside Disposition Plan: Uncertain at this time  Consultants:   GI  Procedures:     Antimicrobials: Anti-infectives    Start     Dose/Rate Route Frequency Ordered Stop   11/23/16 0145  ciprofloxacin (CIPRO) IVPB 400 mg     400 mg 200 mL/hr over 60 Minutes Intravenous 2 times daily 11/23/16 0130     11/23/16 0130  metroNIDAZOLE (FLAGYL) IVPB 500 mg     500 mg 100 mL/hr over 60 Minutes Intravenous Every 8 hours 11/23/16 0112         Subjective: No complaints  Objective: Vitals:   11/23/16 0116 11/23/16 0146 11/23/16 0645 11/23/16 1336  BP: 139/63  (!) 141/80 (!) 120/54  Pulse: 84  70 65  Resp: 18  18 19   Temp: 98 F (36.7 C)  98.1 F (36.7 C) 98.8 F (37.1 C)  TempSrc: Oral  Oral Oral  SpO2: 98%  99% 98%  Weight:  75 kg (165 lb 5.5 oz)    Height:  5\' 10"  (1.778 m)      Intake/Output Summary (  Last 24 hours) at 11/23/16 1643 Last data filed at 11/23/16 1512  Gross per 24 hour  Intake            617.5 ml  Output              400 ml  Net            217.5 ml   Filed Weights   11/22/16 1806 11/23/16 0146  Weight: 76.7 kg (169 lb) 75 kg (165 lb 5.5 oz)    Examination:  General exam: Appears calm and comfortable  Respiratory system: Clear to auscultation. Respiratory effort normal. Cardiovascular system: S1 & S2 heard, RRR Gastrointestinal system: Abdomen is nondistended, soft and nontender. No organomegaly or masses felt. Normal bowel sounds heard. Central nervous  system: Alert and oriented. No focal neurological deficits. Extremities: Symmetric 5 x 5 power. Skin: No rashes, lesions Psychiatry: Judgement and insight appear normal. Mood & affect appropriate.   Data Reviewed: I have personally reviewed following labs and imaging studies  CBC:  Recent Labs Lab 11/22/16 1915 11/23/16 0130  WBC 12.6* 13.3*  NEUTROABS 11.4* 11.4*  HGB 11.5* 10.5*  HCT 33.1* 31.4*  MCV 96.2 96.3  PLT 124* 123XX123*   Basic Metabolic Panel:  Recent Labs Lab 11/22/16 1915 11/23/16 0130  NA 139 138  K 4.1 4.4  CL 108 107  CO2 19* 25  GLUCOSE 125* 137*  BUN 19 19  CREATININE 1.49* 1.43*  CALCIUM 8.8* 8.4*   GFR: Estimated Creatinine Clearance: 39 mL/min (by C-G formula based on SCr of 1.43 mg/dL (H)). Liver Function Tests:  Recent Labs Lab 11/22/16 1915 11/23/16 0130  AST 960* 828*  ALT 300* 301*  ALKPHOS 342* 321*  BILITOT 3.1* 3.9*  PROT 6.9 6.8  ALBUMIN 3.8 3.6    Recent Labs Lab 11/22/16 1915  LIPASE 16    Recent Labs Lab 11/22/16 1915  AMMONIA 35   Coagulation Profile:  Recent Labs Lab 11/22/16 1915  INR 1.05   Cardiac Enzymes: No results for input(s): CKTOTAL, CKMB, CKMBINDEX, TROPONINI in the last 168 hours. BNP (last 3 results) No results for input(s): PROBNP in the last 8760 hours. HbA1C: No results for input(s): HGBA1C in the last 72 hours. CBG: No results for input(s): GLUCAP in the last 168 hours. Lipid Profile: No results for input(s): CHOL, HDL, LDLCALC, TRIG, CHOLHDL, LDLDIRECT in the last 72 hours. Thyroid Function Tests: No results for input(s): TSH, T4TOTAL, FREET4, T3FREE, THYROIDAB in the last 72 hours. Anemia Panel: No results for input(s): VITAMINB12, FOLATE, FERRITIN, TIBC, IRON, RETICCTPCT in the last 72 hours. Sepsis Labs:  Recent Labs Lab 11/22/16 1934 11/22/16 2326 11/23/16 0130  PROCALCITON  --   --  3.17  LATICACIDVEN 3.35* 2.02* 1.2    No results found for this or any previous visit  (from the past 240 hour(s)).   Radiology Studies: Ct Abdomen Pelvis W Wo Contrast  Result Date: 11/22/2016 CLINICAL DATA:  History of common bile duct stricture and potential pancreatic head mass. Common bile duct stent placed 09/06/2015. EXAM: CT ABDOMEN AND PELVIS WITHOUT AND WITH CONTRAST TECHNIQUE: Multidetector CT imaging of the abdomen and pelvis was performed following the standard protocol before and following the bolus administration of intravenous contrast. CONTRAST:  1 ISOVUE-300 IOPAMIDOL (ISOVUE-300) INJECTION 61%, 1 ISOVUE-300 IOPAMIDOL (ISOVUE-300) INJECTION 61% COMPARISON:  PET-CT 10/24/2015 FINDINGS: Lower chest:  Basilar atelectasis bilaterally. Hepatobiliary: The liver shows diffusely decreased attenuation suggesting steatosis. No focal abnormality within the liver parenchyma. No substantial intrahepatic  biliary duct dilatation. Pneumobilia is compatible with the presence of the common bile duct stent. Gallbladder unremarkable. Metallic common bile duct stent appears appropriately positioned. Pancreas: Soft tissue fullness noted in the uncinate process of the pancreas, more pronounced then on prior imaging studies. Soft tissue now appears more masslike and measures 2.6 x 3.4 cm. Diffuse dilatation of the main pancreatic duct is new since PET-CT of 10/24/2015. Spleen: No splenomegaly. No focal mass lesion. Adrenals/Urinary Tract: No adrenal nodule or mass. Cortical thinning noted both kidneys. No hydronephrosis. Stomach/Bowel: Small hiatal hernia. Stomach otherwise unremarkable. Duodenal wall thickening noted in the region of the stent device. Visualize small bowel loops and colonic segments of the abdomen are unremarkable. Vascular/Lymphatic: There is abdominal aortic atherosclerosis without aneurysm. There is no gastrohepatic or hepatoduodenal ligament lymphadenopathy. No intraperitoneal or retroperitoneal lymphadenopathy. Other: No intraperitoneal free fluid. Musculoskeletal: Old rib  fractures are noted bilaterally. IMPRESSION: 1. Progression of soft tissue fullness in the uncinate process of the pancreas, now appearing more masslike and measuring 2.6 x 3.4 cm. 2. Interval development of diffuse dilatation main pancreatic duct. 3. Metallic common bile duct stent appears appropriately positioned without substantial intra or extrahepatic biliary duct dilatation. Pneumobilia is associated. 4.  Abdominal Aortic Atherosclerois (ICD10-170.0) Electronically Signed   By: Misty Stanley M.D.   On: 11/22/2016 21:51   Dg Chest 2 View  Result Date: 11/22/2016 CLINICAL DATA:  Acute onset of generalized abdominal pain and sepsis. Initial encounter. EXAM: CHEST  2 VIEW COMPARISON:  Chest radiograph performed 09/01/2015, and PET/CT performed 10/24/2015 FINDINGS: The lungs are well-aerated. Bilateral airspace opacification is concerning for multifocal pneumonia. No pleural effusion or pneumothorax is seen. The heart is borderline enlarged. No acute osseous abnormalities are seen. Chronic right-sided rib deformities are noted. IMPRESSION: 1. Bilateral airspace opacification, concerning for multifocal pneumonia. Followup PA and lateral chest X-ray is recommended in 3-4 weeks following trial of antibiotic therapy to ensure resolution and exclude underlying malignancy. 2. Borderline cardiomegaly. Electronically Signed   By: Garald Balding M.D.   On: 11/22/2016 23:41   Ct Head Wo Contrast  Result Date: 11/22/2016 CLINICAL DATA:  Acute onset of left leg weakness. Initial encounter. EXAM: CT HEAD WITHOUT CONTRAST TECHNIQUE: Contiguous axial images were obtained from the base of the skull through the vertex without intravenous contrast. COMPARISON:  CT of the head performed 02/03/2011, and MRI of the brain performed 02/04/2011 FINDINGS: Brain: No evidence of acute infarction, hemorrhage, hydrocephalus, extra-axial collection or mass lesion/mass effect. Prominence of the ventricles and sulci reflects moderately  severe cortical volume loss. A large cavum septum pellucidum is noted. Scattered periventricular white matter change likely reflects small vessel ischemic microangiopathy. Mild cerebellar atrophy is noted. Mild chronic ischemic change is seen at the external capsule bilaterally. The brainstem and fourth ventricle are within normal limits. The cerebral hemispheres demonstrate grossly normal gray-white differentiation. No mass effect or midline shift is seen. Vascular: No hyperdense vessel or unexpected calcification. Skull: There is no evidence of fracture; visualized osseous structures are unremarkable in appearance. Sinuses/Orbits: The orbits are within normal limits. The paranasal sinuses and mastoid air cells are well-aerated. Other: No significant soft tissue abnormalities are seen. IMPRESSION: 1. No acute intracranial pathology seen on CT. 2. Moderately severe cortical volume loss and scattered small vessel ischemic microangiopathy. 3. Mild chronic ischemic change at the external capsule bilaterally. Electronically Signed   By: Garald Balding M.D.   On: 11/22/2016 23:50    Scheduled Meds: . ciprofloxacin  400 mg Intravenous BID  .  folic acid  1 mg Oral Daily  . metronidazole  500 mg Intravenous Q8H  . mirabegron ER  50 mg Oral Daily  . multivitamin with minerals  1 tablet Oral Daily  . pantoprazole  40 mg Oral Daily  . polyvinyl alcohol  1 drop Both Eyes BID  . thiamine  100 mg Oral Daily   Or  . thiamine  100 mg Intravenous Daily   Continuous Infusions:   LOS: 1 day   Ayvin Lipinski, Orpah Melter, MD [facility] Pager [phone]  If 7PM-7AM, please contact night-coverage [email or web address] Password [facility] 11/23/2016, 4:43 PM

## 2016-11-23 NOTE — Evaluation (Signed)
Physical Therapy Evaluation Patient Details Name: RAYDEN KOHRING MRN: [id number] DOB: 1928-11-22 Today's Date: 11/23/2016   History of Present Illness  83 y.o.gentleman admitted with sepsis. The patient had been in his baseline state of health until the past two weeks. He has had recurrent epigastric pain associated with intermittent nausea and vomiting. PMH: HTN, HLD, CKD 3, chronic anemia, biliary obstruction requiring CBD stent in November 2016.   Clinical Impression  Pt admitted with above diagnosis. Pt currently with functional limitations due to the deficits listed below (see PT Problem List). Pt able to ambulate 25 ft with rw during initial PT session with pt reporting that his legs were feeling weak. Pt does have a very supportive family and his daughter reports that he will have 24 hour assistance by family or hired help. PT to follow to increase his independence and safety with mobility to allow discharge to home.      Follow Up Recommendations Home health PT;Supervision for mobility/OOB    Equipment Recommendations  None recommended by PT    Recommendations for Other Services       Precautions / Restrictions Precautions Precautions: Fall Restrictions Weight Bearing Restrictions: No      Mobility  Bed Mobility Overal bed mobility: Needs Assistance Bed Mobility: Supine to Sit     Supine to sit: Min guard     General bed mobility comments: guard for safety, Using rails to assist  Transfers Overall transfer level: Needs assistance Equipment used: Rolling walker (2 wheeled) Transfers: Sit to/from Stand Sit to Stand: Min guard         General transfer comment: min guard for stability with standing  Ambulation/Gait Ambulation/Gait assistance: Min guard Ambulation Distance (Feet): 25 Feet Assistive device: Rolling walker (2 wheeled) Gait Pattern/deviations: Step-through pattern;Decreased stride length;Trunk flexed Gait velocity: decreased   General Gait  Details: cues for posture and staying close to walker.   Stairs            Wheelchair Mobility    Modified Rankin (Stroke Patients Only)       Balance Overall balance assessment: Needs assistance Sitting-balance support: No upper extremity supported Sitting balance-Leahy Scale: Fair     Standing balance support: Bilateral upper extremity supported Standing balance-Leahy Scale: Poor Standing balance comment: using rw for support                             Pertinent Vitals/Pain Pain Assessment: No/denies pain    Home Living Family/patient expects to be discharged to:: Private residence Living Arrangements: Spouse/significant other Available Help at Discharge: Family;Available 24 hours/day Type of Home: House Home Access: Stairs to enter Entrance Stairs-Rails: Right Entrance Stairs-Number of Steps: 1 Home Layout: One level Home Equipment: Walker - 2 wheels;Cane - quad;Bedside commode Additional Comments: Daughter states that either family or hired nursing will be with the pt upon returing home.     Prior Function Level of Independence: Independent         Comments: driving and active     Hand Dominance        Extremity/Trunk Assessment   Upper Extremity Assessment Upper Extremity Assessment: Generalized weakness    Lower Extremity Assessment Lower Extremity Assessment: Generalized weakness       Communication   Communication: No difficulties  Cognition Arousal/Alertness: Awake/alert Behavior During Therapy: WFL for tasks assessed/performed Overall Cognitive Status: Within Functional Limits for tasks assessed  General Comments      Exercises     Assessment/Plan    PT Assessment Patient needs continued PT services  PT Problem List Decreased strength;Decreased range of motion;Decreased activity tolerance;Decreased balance;Decreased mobility          PT Treatment Interventions DME instruction;Gait  training;Stair training;Functional mobility training;Therapeutic activities;Therapeutic exercise;Patient/family education    PT Goals (Current goals can be found in the Care Plan section)  Acute Rehab PT Goals Patient Stated Goal: go home PT Goal Formulation: With patient/family Time For Goal Achievement: 12/07/16 Potential to Achieve Goals: Good    Frequency Min 3X/week   Barriers to discharge        Co-evaluation               End of Session Equipment Utilized During Treatment: Gait belt Activity Tolerance: Patient limited by fatigue Patient left: in chair;with call bell/phone within reach;with chair alarm set;with family/visitor present Nurse Communication: Mobility status         Time: [id number] PT Time Calculation (min) (ACUTE ONLY): 24 min   Charges:   PT Evaluation $PT Eval Moderate Complexity: 1 Procedure PT Treatments $Gait Training: 8-22 mins   PT G Codes:        Cassell Clement, PT, CSCS Pager [phone] Office [phone]  11/23/2016, 1:30 PM

## 2016-11-23 NOTE — Consult Note (Signed)
Reason for Consult: Probable cholangitis pancreatic mass Referring Physician: Hospital team  [name] [name] is an 83 y.o. male.  HPI: Patient seen and examined and his hospital computer chart was reviewed as well as our office computer chart and case discussed with his wife and daughter as well as the ER physician last night and he saw our PA midweek with increasing abdominal pain and loose stools and his liver tests were only minimally elevated and his CT was planned for next week however he developed a fever and increased pain and presented to the emergency room and his liver tests were significantly elevated and his white count slightly elevated and his CT showed a mass that was not seen before although the stent seem to be in good position and after some antibiotics he is feeling better and wants to eat and we discussed his history and he has no other complaints  Past Medical History:  Diagnosis Date  . Afebrile   . Anemia   . Chronic renal insufficiency   . Colitis   . Dyslipidemia   . Dysphagia   . ED (erectile dysfunction)   . Edema of extremities   . Hiatus hernia syndrome   . Hypertension    systolic  . Inguinal hernia   . Microhematuria   . Onychomycosis   . Osteoporosis   . Pleural effusion   . Prostate cancer ([place])   . Shingles   . Syncope 2012    Past Surgical History:  Procedure Laterality Date  . ERCP N/A 09/06/2015   Procedure: ENDOSCOPIC RETROGRADE CHOLANGIOPANCREATOGRAPHY (ERCP);  Surgeon: Arta Silence, MD;  Location: [facility];  Service: Endoscopy;  Laterality: N/A;  . EUS Left 09/06/2015   Procedure: UPPER ENDOSCOPIC ULTRASOUND (EUS) LINEAR;  Surgeon: Arta Silence, MD;  Location: [facility];  Service: Endoscopy;  Laterality: Left;  . TRANSPERINEAL IMPLANT OF RADIATION SEEDS W/ ULTRASOUND  2001    Family History  Problem Relation Age of Onset  . Throat cancer Mother   . Lung cancer Brother   . Bone cancer Brother     Social History:  reports  that he quit smoking about 64 years ago. He has never used smokeless tobacco. He reports that he drinks about 8.4 oz of alcohol per week . He reports that he does not use drugs.  Allergies:  Allergies  Allergen Reactions  . Penicillins Hives and Other (See Comments)    Has patient had a PCN reaction causing immediate rash, facial/tongue/throat swelling, SOB or lightheadedness with hypotension: No Has patient had a PCN reaction causing severe rash involving mucus membranes or skin necrosis: No Has patient had a PCN reaction that required hospitalization No Has patient had a PCN reaction occurring within the last 10 years: No If all of the above answers are "NO", then may proceed with Cephalosporin use.  Marland Kitchen Oxycontin [Oxycodone Hcl] Other (See Comments)    Reaction:  Altered mental status     Medications: I have reviewed the patient's current medications.  Results for orders placed or performed during the hospital encounter of 11/22/16 (from the past 48 hour(s))  CBC with Differential     Status: Abnormal   Collection Time: 11/22/16  7:15 PM  Result Value Ref Range   WBC 12.6 (H) 4.0 - 10.5 K/uL   RBC 3.44 (L) 4.22 - 5.81 MIL/uL   Hemoglobin 11.5 (L) 13.0 - 17.0 g/dL   HCT 33.1 (L) 39.0 - 52.0 %   MCV 96.2 78.0 - 100.0 fL  MCH 33.4 26.0 - 34.0 pg   MCHC 34.7 30.0 - 36.0 g/dL   RDW 13.1 11.5 - 15.5 %   Platelets 124 (L) 150 - 400 K/uL   Neutrophils Relative % 91 %   Neutro Abs 11.4 (H) 1.7 - 7.7 K/uL   Lymphocytes Relative 4 %   Lymphs Abs 0.6 (L) 0.7 - 4.0 K/uL   Monocytes Relative 5 %   Monocytes Absolute 0.7 0.1 - 1.0 K/uL   Eosinophils Relative 0 %   Eosinophils Absolute 0.0 0.0 - 0.7 K/uL   Basophils Relative 0 %   Basophils Absolute 0.0 0.0 - 0.1 K/uL  Comprehensive metabolic panel     Status: Abnormal   Collection Time: 11/22/16  7:15 PM  Result Value Ref Range   Sodium 139 135 - 145 mmol/L   Potassium 4.1 3.5 - 5.1 mmol/L   Chloride 108 101 - 111 mmol/L   CO2 19 (L)  22 - 32 mmol/L   Glucose, Bld 125 (H) 65 - 99 mg/dL   BUN 19 6 - 20 mg/dL   Creatinine, Ser 1.49 (H) 0.61 - 1.24 mg/dL   Calcium 8.8 (L) 8.9 - 10.3 mg/dL   Total Protein 6.9 6.5 - 8.1 g/dL   Albumin 3.8 3.5 - 5.0 g/dL   AST 960 (H) 15 - 41 U/L   ALT 300 (H) 17 - 63 U/L   Alkaline Phosphatase 342 (H) 38 - 126 U/L   Total Bilirubin 3.1 (H) 0.3 - 1.2 mg/dL   GFR calc non Af Amer 41 (L) >60 mL/min   GFR calc Af Amer 48 (L) >60 mL/min    Comment: (NOTE) The eGFR has been calculated using the CKD EPI equation. This calculation has not been validated in all clinical situations. eGFR's persistently <60 mL/min signify possible Chronic Kidney Disease.    Anion gap 12 5 - 15  Lipase, blood     Status: None   Collection Time: 11/22/16  7:15 PM  Result Value Ref Range   Lipase 16 11 - 51 U/L  Ammonia     Status: None   Collection Time: 11/22/16  7:15 PM  Result Value Ref Range   Ammonia 35 9 - 35 umol/L  Protime-INR     Status: None   Collection Time: 11/22/16  7:15 PM  Result Value Ref Range   Prothrombin Time 13.7 11.4 - 15.2 seconds   INR 1.05   APTT     Status: None   Collection Time: 11/22/16  7:15 PM  Result Value Ref Range   aPTT 28 24 - 36 seconds  I-Stat CG4 Lactic Acid, ED     Status: Abnormal   Collection Time: 11/22/16  7:34 PM  Result Value Ref Range   Lactic Acid, Venous 3.35 (HH) 0.5 - 1.9 mmol/L   Comment NOTIFIED PHYSICIAN   Urinalysis, Routine w reflex microscopic     Status: Abnormal   Collection Time: 11/22/16  8:52 PM  Result Value Ref Range   Color, Urine YELLOW YELLOW   APPearance CLEAR CLEAR   Specific Gravity, Urine 1.013 1.005 - 1.030   pH 5.0 5.0 - 8.0   Glucose, UA NEGATIVE NEGATIVE mg/dL   Hgb urine dipstick LARGE (A) NEGATIVE   Bilirubin Urine NEGATIVE NEGATIVE   Ketones, ur NEGATIVE NEGATIVE mg/dL   Protein, ur 30 (A) NEGATIVE mg/dL   Nitrite NEGATIVE NEGATIVE   Leukocytes, UA NEGATIVE NEGATIVE   RBC / HPF 0-5 0 - 5 RBC/hpf   WBC, UA  0-5 0 -  5 WBC/hpf   Bacteria, UA RARE (A) NONE SEEN   Squamous Epithelial / LPF 0-5 (A) NONE SEEN   Mucous PRESENT   Ethanol     Status: Abnormal   Collection Time: 11/22/16 11:11 PM  Result Value Ref Range   Alcohol, Ethyl (B) 5 (H) <5 mg/dL    Comment:        LOWEST DETECTABLE LIMIT FOR SERUM ALCOHOL IS 5 mg/dL FOR MEDICAL PURPOSES ONLY   Acetaminophen level     Status: Abnormal   Collection Time: 11/22/16 11:11 PM  Result Value Ref Range   Acetaminophen (Tylenol), Serum <10 (L) 10 - 30 ug/mL    Comment:        THERAPEUTIC CONCENTRATIONS VARY SIGNIFICANTLY. A RANGE OF 10-30 ug/mL MAY BE AN EFFECTIVE CONCENTRATION FOR MANY PATIENTS. HOWEVER, SOME ARE BEST TREATED AT CONCENTRATIONS OUTSIDE THIS RANGE. ACETAMINOPHEN CONCENTRATIONS >150 ug/mL AT 4 HOURS AFTER INGESTION AND >50 ug/mL AT 12 HOURS AFTER INGESTION ARE OFTEN ASSOCIATED WITH TOXIC REACTIONS.   I-Stat CG4 Lactic Acid, ED     Status: Abnormal   Collection Time: 11/22/16 11:26 PM  Result Value Ref Range   Lactic Acid, Venous 2.02 (HH) 0.5 - 1.9 mmol/L   Comment NOTIFIED PHYSICIAN   Lactic acid, plasma     Status: None   Collection Time: 11/23/16  1:30 AM  Result Value Ref Range   Lactic Acid, Venous 1.2 0.5 - 1.9 mmol/L  Procalcitonin     Status: None   Collection Time: 11/23/16  1:30 AM  Result Value Ref Range   Procalcitonin 3.17 ng/mL    Comment:        Interpretation: PCT > 2 ng/mL: Systemic infection (sepsis) is likely, unless other causes are known. (NOTE)         ICU PCT Algorithm               Non ICU PCT Algorithm    ----------------------------     ------------------------------         PCT < 0.25 ng/mL                 PCT < 0.1 ng/mL     Stopping of antibiotics            Stopping of antibiotics       strongly encouraged.               strongly encouraged.    ----------------------------     ------------------------------       PCT level decrease by               PCT < 0.25 ng/mL       >= 80% from  peak PCT       OR PCT 0.25 - 0.5 ng/mL          Stopping of antibiotics                                             encouraged.     Stopping of antibiotics           encouraged.    ----------------------------     ------------------------------       PCT level decrease by              PCT >= 0.25 ng/mL       < 80% from  peak PCT        AND PCT >= 0.5 ng/mL            Continuing antibiotics                                               encouraged.       Continuing antibiotics            encouraged.    ----------------------------     ------------------------------     PCT level increase compared          PCT > 0.5 ng/mL         with peak PCT AND          PCT >= 0.5 ng/mL             Escalation of antibiotics                                          strongly encouraged.      Escalation of antibiotics        strongly encouraged.   CBC with Differential/Platelet     Status: Abnormal   Collection Time: 11/23/16  1:30 AM  Result Value Ref Range   WBC 13.3 (H) 4.0 - 10.5 K/uL   RBC 3.26 (L) 4.22 - 5.81 MIL/uL   Hemoglobin 10.5 (L) 13.0 - 17.0 g/dL   HCT 31.4 (L) 39.0 - 52.0 %   MCV 96.3 78.0 - 100.0 fL   MCH 32.2 26.0 - 34.0 pg   MCHC 33.4 30.0 - 36.0 g/dL   RDW 13.2 11.5 - 15.5 %   Platelets 121 (L) 150 - 400 K/uL   Neutrophils Relative % 86 %   Neutro Abs 11.4 (H) 1.7 - 7.7 K/uL   Lymphocytes Relative 9 %   Lymphs Abs 1.2 0.7 - 4.0 K/uL   Monocytes Relative 5 %   Monocytes Absolute 0.7 0.1 - 1.0 K/uL   Eosinophils Relative 0 %   Eosinophils Absolute 0.0 0.0 - 0.7 K/uL   Basophils Relative 0 %   Basophils Absolute 0.0 0.0 - 0.1 K/uL  Comprehensive metabolic panel     Status: Abnormal   Collection Time: 11/23/16  1:30 AM  Result Value Ref Range   Sodium 138 135 - 145 mmol/L   Potassium 4.4 3.5 - 5.1 mmol/L   Chloride 107 101 - 111 mmol/L   CO2 25 22 - 32 mmol/L   Glucose, Bld 137 (H) 65 - 99 mg/dL   BUN 19 6 - 20 mg/dL   Creatinine, Ser 1.43 (H) 0.61 - 1.24 mg/dL    Calcium 8.4 (L) 8.9 - 10.3 mg/dL   Total Protein 6.8 6.5 - 8.1 g/dL   Albumin 3.6 3.5 - 5.0 g/dL   AST 828 (H) 15 - 41 U/L   ALT 301 (H) 17 - 63 U/L   Alkaline Phosphatase 321 (H) 38 - 126 U/L   Total Bilirubin 3.9 (H) 0.3 - 1.2 mg/dL   GFR calc non Af Amer 43 (L) >60 mL/min   GFR calc Af Amer 50 (L) >60 mL/min    Comment: (NOTE) The eGFR has been calculated using the CKD EPI equation. This calculation has not been validated in all clinical situations. eGFR's persistently <60 mL/min  signify possible Chronic Kidney Disease.    Anion gap 6 5 - 15    Ct Abdomen Pelvis W Wo Contrast  Result Date: 11/22/2016 CLINICAL DATA:  History of common bile duct stricture and potential pancreatic head mass. Common bile duct stent placed 09/06/2015. EXAM: CT ABDOMEN AND PELVIS WITHOUT AND WITH CONTRAST TECHNIQUE: Multidetector CT imaging of the abdomen and pelvis was performed following the standard protocol before and following the bolus administration of intravenous contrast. CONTRAST:  1 ISOVUE-300 IOPAMIDOL (ISOVUE-300) INJECTION 61%, 1 ISOVUE-300 IOPAMIDOL (ISOVUE-300) INJECTION 61% COMPARISON:  PET-CT 10/24/2015 FINDINGS: Lower chest:  Basilar atelectasis bilaterally. Hepatobiliary: The liver shows diffusely decreased attenuation suggesting steatosis. No focal abnormality within the liver parenchyma. No substantial intrahepatic biliary duct dilatation. Pneumobilia is compatible with the presence of the common bile duct stent. Gallbladder unremarkable. Metallic common bile duct stent appears appropriately positioned. Pancreas: Soft tissue fullness noted in the uncinate process of the pancreas, more pronounced then on prior imaging studies. Soft tissue now appears more masslike and measures 2.6 x 3.4 cm. Diffuse dilatation of the main pancreatic duct is new since PET-CT of 10/24/2015. Spleen: No splenomegaly. No focal mass lesion. Adrenals/Urinary Tract: No adrenal nodule or mass. Cortical thinning noted both  kidneys. No hydronephrosis. Stomach/Bowel: Small hiatal hernia. Stomach otherwise unremarkable. Duodenal wall thickening noted in the region of the stent device. Visualize small bowel loops and colonic segments of the abdomen are unremarkable. Vascular/Lymphatic: There is abdominal aortic atherosclerosis without aneurysm. There is no gastrohepatic or hepatoduodenal ligament lymphadenopathy. No intraperitoneal or retroperitoneal lymphadenopathy. Other: No intraperitoneal free fluid. Musculoskeletal: Old rib fractures are noted bilaterally. IMPRESSION: 1. Progression of soft tissue fullness in the uncinate process of the pancreas, now appearing more masslike and measuring 2.6 x 3.4 cm. 2. Interval development of diffuse dilatation main pancreatic duct. 3. Metallic common bile duct stent appears appropriately positioned without substantial intra or extrahepatic biliary duct dilatation. Pneumobilia is associated. 4.  Abdominal Aortic Atherosclerois (ICD10-170.0) Electronically Signed   By: Misty Stanley M.D.   On: 11/22/2016 21:51   Dg Chest 2 View  Result Date: 11/22/2016 CLINICAL DATA:  Acute onset of generalized abdominal pain and sepsis. Initial encounter. EXAM: CHEST  2 VIEW COMPARISON:  Chest radiograph performed 09/01/2015, and PET/CT performed 10/24/2015 FINDINGS: The lungs are well-aerated. Bilateral airspace opacification is concerning for multifocal pneumonia. No pleural effusion or pneumothorax is seen. The heart is borderline enlarged. No acute osseous abnormalities are seen. Chronic right-sided rib deformities are noted. IMPRESSION: 1. Bilateral airspace opacification, concerning for multifocal pneumonia. Followup PA and lateral chest X-ray is recommended in 3-4 weeks following trial of antibiotic therapy to ensure resolution and exclude underlying malignancy. 2. Borderline cardiomegaly. Electronically Signed   By: Garald Balding M.D.   On: 11/22/2016 23:41   Ct Head Wo Contrast  Result Date:  11/22/2016 CLINICAL DATA:  Acute onset of left leg weakness. Initial encounter. EXAM: CT HEAD WITHOUT CONTRAST TECHNIQUE: Contiguous axial images were obtained from the base of the skull through the vertex without intravenous contrast. COMPARISON:  CT of the head performed 02/03/2011, and MRI of the brain performed 02/04/2011 FINDINGS: Brain: No evidence of acute infarction, hemorrhage, hydrocephalus, extra-axial collection or mass lesion/mass effect. Prominence of the ventricles and sulci reflects moderately severe cortical volume loss. A large cavum septum pellucidum is noted. Scattered periventricular white matter change likely reflects small vessel ischemic microangiopathy. Mild cerebellar atrophy is noted. Mild chronic ischemic change is seen at the external capsule bilaterally. The brainstem and fourth  ventricle are within normal limits. The cerebral hemispheres demonstrate grossly normal gray-white differentiation. No mass effect or midline shift is seen. Vascular: No hyperdense vessel or unexpected calcification. Skull: There is no evidence of fracture; visualized osseous structures are unremarkable in appearance. Sinuses/Orbits: The orbits are within normal limits. The paranasal sinuses and mastoid air cells are well-aerated. Other: No significant soft tissue abnormalities are seen. IMPRESSION: 1. No acute intracranial pathology seen on CT. 2. Moderately severe cortical volume loss and scattered small vessel ischemic microangiopathy. 3. Mild chronic ischemic change at the external capsule bilaterally. Electronically Signed   By: Garald Balding M.D.   On: 11/22/2016 23:50    ROS negative except above Blood pressure (!) 141/80, pulse 70, temperature 98.1 F (36.7 C), temperature source Oral, resp. rate 18, height 5' 10"  (1.778 m), weight 75 kg (165 lb 5.5 oz), SpO2 99 %. Physical Exam Vital signs stable afebrile now no acute distress sclerae are not obviously icteric abdomen is soft nontender today CT  reviewed labs stable today Assessment/Plan: Probable cholangitis and pancreatic mass Plan: We discussed with the family and the patient possible repeat ERCP with either stent change or cleaning out the stent versus possibly an EUS with biopsy as well and consideration of chemoradiation versus just treating with antibiotics and seeing how he does and they will think about all of the above and I will discuss his case early next week with Dr. Paulita Fujita and I will check on tomorrow and in the meantime will allow clear liquids and then can slowly advance diet as tolerated  Lynora Dymond E 11/23/2016, 12:44 PM

## 2016-11-24 ENCOUNTER — Inpatient Hospital Stay (HOSPITAL_COMMUNITY): Payer: Medicare Other

## 2016-11-24 LAB — BASIC METABOLIC PANEL
Anion gap: 9 (ref 5–15)
BUN: 15 mg/dL (ref 6–20)
CHLORIDE: 105 mmol/L (ref 101–111)
CO2: 22 mmol/L (ref 22–32)
CREATININE: 1.29 mg/dL — AB (ref 0.61–1.24)
Calcium: 8.4 mg/dL — ABNORMAL LOW (ref 8.9–10.3)
GFR calc Af Amer: 57 mL/min — ABNORMAL LOW (ref >60)
GFR, EST NON AFRICAN AMERICAN: 49 mL/min — AB (ref >60)
GLUCOSE: 128 mg/dL — AB (ref 65–99)
Potassium: 3.3 mmol/L — ABNORMAL LOW (ref 3.5–5.1)
SODIUM: 136 mmol/L (ref 135–145)

## 2016-11-24 LAB — CBC
HCT: 30 % — ABNORMAL LOW (ref 39.0–52.0)
Hemoglobin: 10.1 g/dL — ABNORMAL LOW (ref 13.0–17.0)
MCH: 32.6 pg (ref 26.0–34.0)
MCHC: 33.7 g/dL (ref 30.0–36.0)
MCV: 96.8 fL (ref 78.0–100.0)
PLATELETS: 102 10*3/uL — AB (ref 150–400)
RBC: 3.1 MIL/uL — ABNORMAL LOW (ref 4.22–5.81)
RDW: 13.4 % (ref 11.5–15.5)
WBC: 7 10*3/uL (ref 4.0–10.5)

## 2016-11-24 LAB — HEPATIC FUNCTION PANEL
ALT: 270 U/L — ABNORMAL HIGH (ref 17–63)
AST: 528 U/L — AB (ref 15–41)
Albumin: 3.3 g/dL — ABNORMAL LOW (ref 3.5–5.0)
Alkaline Phosphatase: 293 U/L — ABNORMAL HIGH (ref 38–126)
BILIRUBIN DIRECT: 1 mg/dL — AB (ref 0.1–0.5)
Indirect Bilirubin: 0.6 mg/dL (ref 0.3–0.9)
Total Bilirubin: 1.6 mg/dL — ABNORMAL HIGH (ref 0.3–1.2)
Total Protein: 6.2 g/dL — ABNORMAL LOW (ref 6.5–8.1)

## 2016-11-24 NOTE — Progress Notes (Signed)
[name] [name] 11:07 AM  Subjective: Patient doing fine from a GI standpoint but his back hurts due to the mattress and he has some urinary complaints which we discussed with the family and his case discussed with the hospital team as well  Objective: Vital signs stable afebrile no acute distress abdomen is soft nontender liver tests decreased white count decreased  Assessment: Cholangitis currently improved  Plan: Continue present management I believe he'll need a EUS and possible ERCP for stent cleaning out or replacement and I'm waiting to hear back about the timing of that from my partner Dr. Paulita Fujita and certainly that can even be set up as an outpatient if patient continues to improve and await sensitivities to adjust antibiotics  [facility]  Pager [phone] After 5PM or if no answer call [phone]

## 2016-11-24 NOTE — Progress Notes (Signed)
PHARMACY - PHYSICIAN COMMUNICATION CRITICAL VALUE ALERT - BLOOD CULTURE IDENTIFICATION (BCID)  Results for orders placed or performed during the hospital encounter of 11/22/16  Blood Culture ID Panel (Reflexed) (Collected: 11/23/2016  1:30 AM)  Result Value Ref Range   Enterococcus species NOT DETECTED NOT DETECTED   Listeria monocytogenes NOT DETECTED NOT DETECTED   Staphylococcus species NOT DETECTED NOT DETECTED   Staphylococcus aureus NOT DETECTED NOT DETECTED   Streptococcus species NOT DETECTED NOT DETECTED   Streptococcus agalactiae NOT DETECTED NOT DETECTED   Streptococcus pneumoniae NOT DETECTED NOT DETECTED   Streptococcus pyogenes NOT DETECTED NOT DETECTED   Acinetobacter baumannii NOT DETECTED NOT DETECTED   Enterobacteriaceae species DETECTED (A) NOT DETECTED   Enterobacter cloacae complex NOT DETECTED NOT DETECTED   Escherichia coli DETECTED (A) NOT DETECTED   Klebsiella oxytoca NOT DETECTED NOT DETECTED   Klebsiella pneumoniae NOT DETECTED NOT DETECTED   Proteus species NOT DETECTED NOT DETECTED   Serratia marcescens NOT DETECTED NOT DETECTED   Carbapenem resistance NOT DETECTED NOT DETECTED   Haemophilus influenzae NOT DETECTED NOT DETECTED   Neisseria meningitidis NOT DETECTED NOT DETECTED   Pseudomonas aeruginosa NOT DETECTED NOT DETECTED   Candida albicans NOT DETECTED NOT DETECTED   Candida glabrata NOT DETECTED NOT DETECTED   Candida krusei NOT DETECTED NOT DETECTED   Candida parapsilosis NOT DETECTED NOT DETECTED   Candida tropicalis NOT DETECTED NOT DETECTED    Name of physician (or Provider) Contacted: Dr. Maudie Mercury  Changes to prescribed antibiotics required: Change cipro to ceftriaxone 2gm iv q24hr  [name] [name] 11/24/2016  3:48 AM

## 2016-11-24 NOTE — Progress Notes (Signed)
PROGRESS NOTE    [name] [name]  [name] DOB: Oct 21, 1928 DOA: 11/22/2016 PCP: Limmie Patricia, MD    Brief Narrative:  83 y.o. gentleman with a history of HTN, HLD, CKD 3, chronic anemia, GERD, and biliary obstruction requiring CBD stent in November 2016.  The patient has been in his baseline state of health until the past two weeks.  He has had recurrent epigastric pain associated with intermittent nausea and vomiting.  He has been taking acetaminophen, but it has not helped his pain.  He has had decreased appetite and has lost 5 lbs in the past two weeks.  No fevers, chills, or sweats.  No syncope.  Today, he had a self-limited episode of what he describes as left leg numbness and weakness ("I could not move it.")  He was unable to get up from his bed, even with assistance, which is atypical for him.  He did not fall.  His wife called the neighbors, who ultimately notified his daughter.  The patient was brought in by EMS for further evaluation.  No witnessed slurred speech or facial droop.  No difficulty swallowing.  Left leg symptoms resolved spontaneously, without specific intervention.  He has urinary incontinence at baseline; wears depends.  No appreciable difference in urine output.  No odor  Assessment & Plan:   Principal Problem:   Sepsis ([place]) Active Problems:   Essential hypertension   Transaminitis   CKD (chronic kidney disease)   Urinary incontinence   Hyperbilirubinemia   History of biliary stent insertion   Pancreatic mass   Left leg weakness  Abdominal pain with low grade fever and abnormal LFTs.//e.coli bacteremia  -- Pancreatic mass noted on CT.  Biliary stent remains in place. --Cipro and flagyl was started empirically. - Cipro has been changed to rocephin --mild leukocytosis has resolved --Will recheck CBC in AM -Seen with GI in room today.  -Consideration for repeat ERCP vs EUS with biopsy vs abx alone. Will follow on recommendations  Possible  sepsis with low grade fever, tachycardia, elevated lactic acid level, and leukocytosis.with ecoli bacteremia --blood cx pos for ecoli --Urinalysis unremarkable --Continue rocephin and flagyl per above  Left leg weakness, per patient.  Self-limited.  Resolved now. --Head CT unremakrable --PT/OT eval and treat --Fall precautions --Patient has a hx of low back pain and osteoporosis. - Will order lumbar xray given concerns of radiculopathy  Daily EtOH use --EtOH cessation advised due to abnormal LFTs --CIWA protocol continued --Folate, thiamine, MVI - Stable currently  Urinary incontinence --Myrbetriq - Stable at this time  GERD --PPI - Stable currently  DVT prophylaxis: SCd's Code Status: Full Family Communication: Pt in room, family at bedside Disposition Plan: Uncertain at this time  Consultants:   GI  Procedures:     Antimicrobials: Anti-infectives    Start     Dose/Rate Route Frequency Ordered Stop   11/23/16 2200  cefTRIAXone (ROCEPHIN) 2 g in dextrose 5 % 50 mL IVPB     2 g 100 mL/hr over 30 Minutes Intravenous Every 24 hours 11/23/16 2140     11/23/16 0145  ciprofloxacin (CIPRO) IVPB 400 mg  Status:  Discontinued     400 mg 200 mL/hr over 60 Minutes Intravenous 2 times daily 11/23/16 0130 11/23/16 2139   11/23/16 0130  metroNIDAZOLE (FLAGYL) IVPB 500 mg     500 mg 100 mL/hr over 60 Minutes Intravenous Every 8 hours 11/23/16 0112        Subjective: Reports feeling better today  Objective: Vitals:  11/23/16 2024 11/23/16 2209 11/24/16 0359 11/24/16 1346  BP: 125/73  135/89 140/81  Pulse: 78  84 75  Resp: 18  20 20   Temp:  98.7 F (37.1 C) 98.3 F (36.8 C) 99 F (37.2 C)  TempSrc: Oral  Oral Oral  SpO2: 100%  98% 99%  Weight:      Height:        Intake/Output Summary (Last 24 hours) at 11/24/16 1611 Last data filed at 11/24/16 1506  Gross per 24 hour  Intake              470 ml  Output              900 ml  Net             -430 ml    Filed Weights   11/22/16 1806 11/23/16 0146  Weight: 76.7 kg (169 lb) 75 kg (165 lb 5.5 oz)    Examination:  General exam: Sitting in chair, conversant, in nad Respiratory system: normal chest rise, no audible wheezing Cardiovascular system: regular rate, s1-2 Gastrointestinal system: soft, nondistended, pos BS. Central nervous system: cn2-12 grossly intact, strength intact Extremities: no clubbing, no cyanosis Skin: normal skin turgor, no notable skin lesions seen Psychiatry: mood normal// no visual hallucinations   Data Reviewed: I have personally reviewed following labs and imaging studies  CBC:  Recent Labs Lab 11/22/16 1915 11/23/16 0130 11/24/16 0522  WBC 12.6* 13.3* 7.0  NEUTROABS 11.4* 11.4*  --   HGB 11.5* 10.5* 10.1*  HCT 33.1* 31.4* 30.0*  MCV 96.2 96.3 96.8  PLT 124* 121* [id number]*   Basic Metabolic Panel:  Recent Labs Lab 11/22/16 1915 11/23/16 0130 11/24/16 0522  NA 139 138 136  K 4.1 4.4 3.3*  CL 108 107 105  CO2 19* 25 22  GLUCOSE 125* 137* 128*  BUN 19 19 15   CREATININE 1.49* 1.43* 1.29*  CALCIUM 8.8* 8.4* 8.4*   GFR: Estimated Creatinine Clearance: 43.2 mL/min (by C-G formula based on SCr of 1.29 mg/dL (H)). Liver Function Tests:  Recent Labs Lab 11/22/16 1915 11/23/16 0130 11/24/16 0522  AST 960* 828* 528*  ALT 300* 301* 270*  ALKPHOS 342* 321* 293*  BILITOT 3.1* 3.9* 1.6*  PROT 6.9 6.8 6.2*  ALBUMIN 3.8 3.6 3.3*    Recent Labs Lab 11/22/16 1915  LIPASE 16    Recent Labs Lab 11/22/16 1915  AMMONIA 35   Coagulation Profile:  Recent Labs Lab 11/22/16 1915  INR 1.05   Cardiac Enzymes: No results for input(s): CKTOTAL, CKMB, CKMBINDEX, TROPONINI in the last 168 hours. BNP (last 3 results) No results for input(s): PROBNP in the last 8760 hours. HbA1C: No results for input(s): HGBA1C in the last 72 hours. CBG: No results for input(s): GLUCAP in the last 168 hours. Lipid Profile: No results for input(s): CHOL, HDL,  LDLCALC, TRIG, CHOLHDL, LDLDIRECT in the last 72 hours. Thyroid Function Tests: No results for input(s): TSH, T4TOTAL, FREET4, T3FREE, THYROIDAB in the last 72 hours. Anemia Panel: No results for input(s): VITAMINB12, FOLATE, FERRITIN, TIBC, IRON, RETICCTPCT in the last 72 hours. Sepsis Labs:  Recent Labs Lab 11/22/16 1934 11/22/16 2326 11/23/16 0130  PROCALCITON  --   --  3.17  LATICACIDVEN 3.35* 2.02* 1.2    Recent Results (from the past 240 hour(s))  Culture, blood (x 2)     Status: Abnormal (Preliminary result)   Collection Time: 11/23/16  1:30 AM  Result Value Ref Range Status   Specimen Description  BLOOD RIGHT ANTECUBITAL  Final   Special Requests IN PEDIATRIC BOTTLE 3CC  Final   Culture  Setup Time   Final    GRAM NEGATIVE RODS IN PEDIATRIC BOTTLE CRITICAL RESULT CALLED TO, READ BACK BY AND VERIFIED WITH: J.GRIMSLEY, PHARM AT [facility], 11/23/16 AT 2125 BY J FUDESCO    Culture (A)  Final    ESCHERICHIA COLI SUSCEPTIBILITIES TO FOLLOW Performed at [facility], [place] [address], [place], [place] [address]    Report Status PENDING  Incomplete  Culture, blood (x 2)     Status: None (Preliminary result)   Collection Time: 11/23/16  1:30 AM  Result Value Ref Range Status   Specimen Description BLOOD RIGHT FOREARM  Final   Special Requests BOTTLES DRAWN AEROBIC AND ANAEROBIC 5CC  Final   Culture   Final    NO GROWTH 1 DAY Performed at [facility], [place] [address] [place], [place] [address]    Report Status PENDING  Incomplete  Blood Culture ID Panel (Reflexed)     Status: Abnormal   Collection Time: 11/23/16  1:30 AM  Result Value Ref Range Status   Enterococcus species NOT DETECTED NOT DETECTED Final   Listeria monocytogenes NOT DETECTED NOT DETECTED Final   Staphylococcus species NOT DETECTED NOT DETECTED Final   Staphylococcus aureus NOT DETECTED NOT DETECTED Final   Streptococcus species NOT DETECTED NOT DETECTED Final   Streptococcus agalactiae NOT DETECTED  NOT DETECTED Final   Streptococcus pneumoniae NOT DETECTED NOT DETECTED Final   Streptococcus pyogenes NOT DETECTED NOT DETECTED Final   Acinetobacter baumannii NOT DETECTED NOT DETECTED Final   Enterobacteriaceae species DETECTED (A) NOT DETECTED Final    Comment: Enterobacteriaceae represent a large family of gram-negative bacteria, not a single organism. CRITICAL RESULT CALLED TO, READ BACK BY AND VERIFIED WITH: Lavell Luster, PHARM AT [facility] 11/23/16 AT 2125 BY J FUDESCO    Enterobacter cloacae complex NOT DETECTED NOT DETECTED Final   Escherichia coli DETECTED (A) NOT DETECTED Final    Comment: CRITICAL RESULT CALLED TO, READ BACK BY AND VERIFIED WITH: Lavell Luster, PHARM AT [facility] 11/23/16 AT 2125 BY J FUDESCO    Klebsiella oxytoca NOT DETECTED NOT DETECTED Final   Klebsiella pneumoniae NOT DETECTED NOT DETECTED Final   Proteus species NOT DETECTED NOT DETECTED Final   Serratia marcescens NOT DETECTED NOT DETECTED Final   Carbapenem resistance NOT DETECTED NOT DETECTED Final   Haemophilus influenzae NOT DETECTED NOT DETECTED Final   Neisseria meningitidis NOT DETECTED NOT DETECTED Final   Pseudomonas aeruginosa NOT DETECTED NOT DETECTED Final   Candida albicans NOT DETECTED NOT DETECTED Final   Candida glabrata NOT DETECTED NOT DETECTED Final   Candida krusei NOT DETECTED NOT DETECTED Final   Candida parapsilosis NOT DETECTED NOT DETECTED Final   Candida tropicalis NOT DETECTED NOT DETECTED Final    Comment: Performed at [facility], [place] [address], [place], [place] [address]     Radiology Studies: Ct Abdomen Pelvis W Wo Contrast  Result Date: 11/22/2016 CLINICAL DATA:  History of common bile duct stricture and potential pancreatic head mass. Common bile duct stent placed 09/06/2015. EXAM: CT ABDOMEN AND PELVIS WITHOUT AND WITH CONTRAST TECHNIQUE: Multidetector CT imaging of the abdomen and pelvis was performed following the standard protocol before and following the bolus  administration of intravenous contrast. CONTRAST:  1 ISOVUE-300 IOPAMIDOL (ISOVUE-300) INJECTION 61%, 1 ISOVUE-300 IOPAMIDOL (ISOVUE-300) INJECTION 61% COMPARISON:  PET-CT 10/24/2015 FINDINGS: Lower chest:  Basilar atelectasis bilaterally.  Hepatobiliary: The liver shows diffusely decreased attenuation suggesting steatosis. No focal abnormality within the liver parenchyma. No substantial intrahepatic biliary duct dilatation. Pneumobilia is compatible with the presence of the common bile duct stent. Gallbladder unremarkable. Metallic common bile duct stent appears appropriately positioned. Pancreas: Soft tissue fullness noted in the uncinate process of the pancreas, more pronounced then on prior imaging studies. Soft tissue now appears more masslike and measures 2.6 x 3.4 cm. Diffuse dilatation of the main pancreatic duct is new since PET-CT of 10/24/2015. Spleen: No splenomegaly. No focal mass lesion. Adrenals/Urinary Tract: No adrenal nodule or mass. Cortical thinning noted both kidneys. No hydronephrosis. Stomach/Bowel: Small hiatal hernia. Stomach otherwise unremarkable. Duodenal wall thickening noted in the region of the stent device. Visualize small bowel loops and colonic segments of the abdomen are unremarkable. Vascular/Lymphatic: There is abdominal aortic atherosclerosis without aneurysm. There is no gastrohepatic or hepatoduodenal ligament lymphadenopathy. No intraperitoneal or retroperitoneal lymphadenopathy. Other: No intraperitoneal free fluid. Musculoskeletal: Old rib fractures are noted bilaterally. IMPRESSION: 1. Progression of soft tissue fullness in the uncinate process of the pancreas, now appearing more masslike and measuring 2.6 x 3.4 cm. 2. Interval development of diffuse dilatation main pancreatic duct. 3. Metallic common bile duct stent appears appropriately positioned without substantial intra or extrahepatic biliary duct dilatation. Pneumobilia is associated. 4.  Abdominal Aortic  Atherosclerois (ICD10-170.0) Electronically Signed   By: Misty Stanley M.D.   On: 11/22/2016 21:51   Dg Chest 2 View  Result Date: 11/22/2016 CLINICAL DATA:  Acute onset of generalized abdominal pain and sepsis. Initial encounter. EXAM: CHEST  2 VIEW COMPARISON:  Chest radiograph performed 09/01/2015, and PET/CT performed 10/24/2015 FINDINGS: The lungs are well-aerated. Bilateral airspace opacification is concerning for multifocal pneumonia. No pleural effusion or pneumothorax is seen. The heart is borderline enlarged. No acute osseous abnormalities are seen. Chronic right-sided rib deformities are noted. IMPRESSION: 1. Bilateral airspace opacification, concerning for multifocal pneumonia. Followup PA and lateral chest X-ray is recommended in 3-4 weeks following trial of antibiotic therapy to ensure resolution and exclude underlying malignancy. 2. Borderline cardiomegaly. Electronically Signed   By: Garald Balding M.D.   On: 11/22/2016 23:41   Dg Lumbar Spine 2-3 Views  Result Date: 11/24/2016 CLINICAL DATA:  Lumbago with left lower extremity radicular symptoms EXAM: LUMBAR SPINE - 2-3 VIEW COMPARISON:  Mar 05, 2010 FINDINGS: Frontal, lateral, and spot lumbosacral lateral images were obtained. There are 5 non-rib-bearing lumbar type vertebral bodies. There is thoracolumbar levoscoliosis. There is no fracture or spondylolisthesis. Disc spaces appear unremarkable. No erosive change. No blastic or lytic lesions evident. There are seed implants in prostate. There is a stent in the biliary region on the right. There is atherosclerotic calcification in the aorta. IMPRESSION: Scoliosis. No fracture or spondylolisthesis. No appreciable arthropathy. No blastic or lytic lesions. No areas stent present. Seed implants in prostate. There is aortic atherosclerosis. Electronically Signed   By: Lowella Grip III M.D.   On: 11/24/2016 12:58   Ct Head Wo Contrast  Result Date: 11/22/2016 CLINICAL DATA:  Acute onset of  left leg weakness. Initial encounter. EXAM: CT HEAD WITHOUT CONTRAST TECHNIQUE: Contiguous axial images were obtained from the base of the skull through the vertex without intravenous contrast. COMPARISON:  CT of the head performed 02/03/2011, and MRI of the brain performed 02/04/2011 FINDINGS: Brain: No evidence of acute infarction, hemorrhage, hydrocephalus, extra-axial collection or mass lesion/mass effect. Prominence of the ventricles and sulci reflects moderately severe cortical volume loss. A large cavum septum pellucidum  is noted. Scattered periventricular white matter change likely reflects small vessel ischemic microangiopathy. Mild cerebellar atrophy is noted. Mild chronic ischemic change is seen at the external capsule bilaterally. The brainstem and fourth ventricle are within normal limits. The cerebral hemispheres demonstrate grossly normal gray-white differentiation. No mass effect or midline shift is seen. Vascular: No hyperdense vessel or unexpected calcification. Skull: There is no evidence of fracture; visualized osseous structures are unremarkable in appearance. Sinuses/Orbits: The orbits are within normal limits. The paranasal sinuses and mastoid air cells are well-aerated. Other: No significant soft tissue abnormalities are seen. IMPRESSION: 1. No acute intracranial pathology seen on CT. 2. Moderately severe cortical volume loss and scattered small vessel ischemic microangiopathy. 3. Mild chronic ischemic change at the external capsule bilaterally. Electronically Signed   By: Garald Balding M.D.   On: 11/22/2016 23:50    Scheduled Meds: . cefTRIAXone (ROCEPHIN)  IV  2 g Intravenous Q24H  . folic acid  1 mg Oral Daily  . metronidazole  500 mg Intravenous Q8H  . mirabegron ER  50 mg Oral Daily  . multivitamin with minerals  1 tablet Oral Daily  . pantoprazole  40 mg Oral Daily  . polyvinyl alcohol  1 drop Both Eyes BID  . thiamine  100 mg Oral Daily   Or  . thiamine  100 mg  Intravenous Daily   Continuous Infusions:   LOS: 2 days   CHIU, Orpah Melter, MD [facility] Pager [phone]  If 7PM-7AM, please contact night-coverage [email or web address] Password [email or web address] 11/24/2016, 4:11 PM

## 2016-11-25 LAB — CULTURE, BLOOD (ROUTINE X 2)

## 2016-11-25 LAB — COMPREHENSIVE METABOLIC PANEL
ALBUMIN: 3.1 g/dL — AB (ref 3.5–5.0)
ALT: 216 U/L — ABNORMAL HIGH (ref 17–63)
ANION GAP: 9 (ref 5–15)
AST: 281 U/L — ABNORMAL HIGH (ref 15–41)
Alkaline Phosphatase: 299 U/L — ABNORMAL HIGH (ref 38–126)
BILIRUBIN TOTAL: 1.7 mg/dL — AB (ref 0.3–1.2)
BUN: 11 mg/dL (ref 6–20)
CALCIUM: 8.3 mg/dL — AB (ref 8.9–10.3)
CO2: 25 mmol/L (ref 22–32)
Chloride: 100 mmol/L — ABNORMAL LOW (ref 101–111)
Creatinine, Ser: 1.24 mg/dL (ref 0.61–1.24)
GFR, EST AFRICAN AMERICAN: 59 mL/min — AB (ref >60)
GFR, EST NON AFRICAN AMERICAN: 51 mL/min — AB (ref >60)
GLUCOSE: 114 mg/dL — AB (ref 65–99)
Potassium: 2.7 mmol/L — CL (ref 3.5–5.1)
Sodium: 134 mmol/L — ABNORMAL LOW (ref 135–145)
TOTAL PROTEIN: 6 g/dL — AB (ref 6.5–8.1)

## 2016-11-25 LAB — CBC WITH DIFFERENTIAL/PLATELET
BASOS ABS: 0.1 10*3/uL (ref 0.0–0.1)
Basophils Relative: 1 %
EOS ABS: 0.1 10*3/uL (ref 0.0–0.7)
Eosinophils Relative: 1 %
HCT: 29.2 % — ABNORMAL LOW (ref 39.0–52.0)
HEMOGLOBIN: 9.9 g/dL — AB (ref 13.0–17.0)
LYMPHS PCT: 18 %
Lymphs Abs: 1.1 10*3/uL (ref 0.7–4.0)
MCH: 31.9 pg (ref 26.0–34.0)
MCHC: 33.9 g/dL (ref 30.0–36.0)
MCV: 94.2 fL (ref 78.0–100.0)
Monocytes Absolute: 0.7 10*3/uL (ref 0.1–1.0)
Monocytes Relative: 11 %
NEUTROS ABS: 4.2 10*3/uL (ref 1.7–7.7)
Neutrophils Relative %: 69 %
Platelets: 101 10*3/uL — ABNORMAL LOW (ref 150–400)
RBC: 3.1 MIL/uL — ABNORMAL LOW (ref 4.22–5.81)
RDW: 13.2 % (ref 11.5–15.5)
WBC: 6.2 10*3/uL (ref 4.0–10.5)

## 2016-11-25 MED ORDER — MUSCLE RUB 10-15 % EX CREA
TOPICAL_CREAM | CUTANEOUS | Status: DC | PRN
  Administered 2016-11-25: 14:00:00 via TOPICAL
  Filled 2016-11-25: qty 85

## 2016-11-25 MED ORDER — POTASSIUM CHLORIDE CRYS ER 20 MEQ PO TBCR
30.0000 meq | EXTENDED_RELEASE_TABLET | Freq: Two times a day (BID) | ORAL | Status: AC
  Administered 2016-11-25: 20:00:00 30 meq via ORAL
  Filled 2016-11-25: qty 1

## 2016-11-25 MED ORDER — IBUPROFEN 200 MG PO TABS
400.0000 mg | ORAL_TABLET | ORAL | Status: DC | PRN
  Administered 2016-11-25 – 2016-11-26 (×2): 400 mg via ORAL
  Filled 2016-11-25 (×3): qty 2

## 2016-11-25 MED ORDER — POTASSIUM CHLORIDE CRYS ER 20 MEQ PO TBCR
30.0000 meq | EXTENDED_RELEASE_TABLET | Freq: Two times a day (BID) | ORAL | Status: DC
  Administered 2016-11-25: 30 meq via ORAL
  Filled 2016-11-25: qty 1

## 2016-11-25 MED ORDER — SODIUM CHLORIDE 0.9 % IV SOLN
30.0000 meq | Freq: Once | INTRAVENOUS | Status: AC
  Administered 2016-11-25: 30 meq via INTRAVENOUS
  Filled 2016-11-25: qty 15

## 2016-11-25 NOTE — Care Management Note (Signed)
Case Management Note  Patient Details  Name: [name] [name] MRN: [id number] Date of Birth: 12-06-1928  Subjective/Objective:83 y/o m admitted w/Sepsis. From home w/spouse.has rw,w/c,3n1. 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                    Action/Plan:d/c home w/HHPT.   Expected Discharge Date:                  Expected Discharge Plan:  [place]  In-House Referral:     Discharge planning Services     Post Acute Care Choice:  Durable Medical Equipment (rw,3n1,w/c.) Choice offered to:  Patient  DME Arranged:    DME Agency:     HH Arranged:    [place] Agency:     Status of Service:  In process, will continue to follow  If discussed at Long Length of Stay Meetings, dates discussed:    Additional Comments:  Dessa Phi, RN 11/25/2016, 11:21 AM

## 2016-11-25 NOTE — Evaluation (Signed)
Occupational Therapy Evaluation Patient Details Name: [name] [name] MRN: [id number] DOB: July 15, 1929 Today's Date: 11/25/2016    History of Present Illness 83 y.o.gentleman admitted with sepsis. The patient had been in his baseline state of health until the past two weeks. He has had recurrent epigastric pain associated with intermittent nausea and vomiting. PMH: HTN, HLD, CKD 3, chronic anemia, biliary obstruction requiring CBD stent in November 2016.    Clinical Impression   Pt admitted with sepsis. Pt currently with functional limitations due to the deficits listed below (see OT Problem List).  Pt will benefit from skilled OT to increase their safety and independence with ADL and functional mobility for ADL to facilitate discharge to venue listed below.      Follow Up Recommendations  Home health OT;Supervision/Assistance - 24 hour    Equipment Recommendations  None recommended by OT    Recommendations for Other Services       Precautions / Restrictions Precautions Precautions: Fall Restrictions Weight Bearing Restrictions: No      Mobility Bed Mobility   Bed Mobility: Supine to Sit     Supine to sit: Min guard        Transfers Overall transfer level: Needs assistance Equipment used: Rolling walker (2 wheeled) Transfers: Sit to/from Stand Sit to Stand: Min assist                   ADL Overall ADL's : Needs assistance/impaired Eating/Feeding: Set up;Sitting   Grooming: Minimal assistance   Upper Body Bathing: Minimal assistance;Sitting   Lower Body Bathing: Minimal assistance;Sit to/from stand;Cueing for safety;Cueing for compensatory techniques   Upper Body Dressing : Minimal assistance;Sitting   Lower Body Dressing: Minimal assistance;Sit to/from stand;Cueing for safety;Cueing for sequencing   Toilet Transfer: Minimal assistance;RW;Ambulation;Cueing for sequencing   Toileting- Clothing Manipulation and Hygiene: Sit to/from stand;Cueing for  safety;Cueing for sequencing       Functional mobility during ADLs: Minimal assistance;Cueing for safety;Cueing for sequencing;Rolling walker                    Pertinent Vitals/Pain Pain Assessment: No/denies pain     Hand Dominance     Extremity/Trunk Assessment Upper Extremity Assessment Upper Extremity Assessment: Generalized weakness (per pt and family ROM in LUE much improved)           Communication Communication Communication: No difficulties   Cognition Arousal/Alertness: Awake/alert Behavior During Therapy: WFL for tasks assessed/performed Overall Cognitive Status: Within Functional Limits for tasks assessed                                Home Living Family/patient expects to be discharged to:: Private residence Living Arrangements: Spouse/significant other Available Help at Discharge: Family;Available 24 hours/day Type of Home: House Home Access: Stairs to enter [employer] of Steps: 1 Entrance Stairs-Rails: Right Home Layout: One level               Home Equipment: Walker - 2 wheels;Cane - quad;Bedside commode   Additional Comments: Daughter states that either family or hired nursing will be with the pt upon returing home.       Prior Functioning/Environment Level of Independence: Independent        Comments: driving and active        OT Problem List: Decreased strength      OT Treatment/Interventions: Self-care/ADL training;DME and/or AE instruction;Patient/family education    OT Goals(Current goals can be  found in the care plan section) Acute Rehab OT Goals Patient Stated Goal: go home OT Goal Formulation: With patient Time For Goal Achievement: 12/02/16 Potential to Achieve Goals: Good  OT Frequency: Min 2X/week   Barriers to D/C:               End of Session Equipment Utilized During Treatment: Surveyor, mining Communication: Mobility status  Activity Tolerance: Patient tolerated  treatment well Patient left: in chair;with call bell/phone within reach;with chair alarm set;with family/visitor present  OT Visit Diagnosis: Unsteadiness on feet (R26.81);Muscle weakness (generalized) (M62.81)                ADL either performed or assessed with clinical judgement  Time: 1540-1600 OT Time Calculation (min): 20 min Charges:  OT General Charges $OT Visit: 1 Procedure OT Evaluation $OT Eval Moderate Complexity: 1 Procedure G-Codes:       Payton Mccallum D 11/25/2016, 4:09 PM

## 2016-11-25 NOTE — Progress Notes (Signed)
PROGRESS NOTE    [name] [name]  [name] DOB: 03/31/1929 DOA: 11/22/2016 PCP: Limmie Patricia, MD    Brief Narrative:  83 y.o. gentleman with a history of HTN, HLD, CKD 3, chronic anemia, GERD, and biliary obstruction requiring CBD stent in November 2016.  The patient has been in his baseline state of health until the past two weeks.  He has had recurrent epigastric pain associated with intermittent nausea and vomiting.  He has been taking acetaminophen, but it has not helped his pain.  He has had decreased appetite and has lost 5 lbs in the past two weeks.  No fevers, chills, or sweats.  No syncope.  Today, he had a self-limited episode of what he describes as left leg numbness and weakness ("I could not move it.")  He was unable to get up from his bed, even with assistance, which is atypical for him.  He did not fall.  His wife called the neighbors, who ultimately notified his daughter.  The patient was brought in by EMS for further evaluation.  No witnessed slurred speech or facial droop.  No difficulty swallowing.  Left leg symptoms resolved spontaneously, without specific intervention.  He has urinary incontinence at baseline; wears depends.  No appreciable difference in urine output.  No odor  Assessment & Plan:   Principal Problem:   Sepsis ([place]) Active Problems:   Essential hypertension   Transaminitis   CKD (chronic kidney disease)   Urinary incontinence   Hyperbilirubinemia   History of biliary stent insertion   Pancreatic mass   Left leg weakness  Abdominal pain with low grade fever and abnormal LFTs.//e.coli bacteremia  -- Pancreatic mass noted on CT.  Biliary stent remains in place. --Cipro and flagyl was started empirically. - Cipro has been changed to rocephin --Leukocytosis resolved - GI following. Consideration for EUS and/or ERCP w/ stent exchange. Further recommendations planned for tomorrow.   Possible sepsis with low grade fever, tachycardia,  elevated lactic acid level, and leukocytosis.with ecoli bacteremia --blood cx pos for pan-sensitive ecoli --Urinalysis unremarkable --Continue rocephin and flagyl per above  Left leg weakness, per patient.  Self-limited.  Resolved now. --Head CT unremakrable --PT/OT eval and treat --Fall precautions --Patient has a hx of low back pain and osteoporosis. - lumbar xray ordered and reviewed. Unremarkable  L shoulder pain - Patient reports limited ROM involving L shoulder with pain - Will order MRI shoulder to r/o soft tissue injury  Daily EtOH use --EtOH cessation advised due to abnormal LFTs --CIWA protocol continued --Folate, thiamine, MVI - remains stable  Urinary incontinence --Myrbetriq - currently stable  GERD --PPI - Stable presently  DVT prophylaxis: SCd's Code Status: Full Family Communication: Pt in room, family at bedside Disposition Plan: Uncertain at this time  Consultants:   GI  Procedures:     Antimicrobials: Anti-infectives    Start     Dose/Rate Route Frequency Ordered Stop   11/23/16 2200  cefTRIAXone (ROCEPHIN) 2 g in dextrose 5 % 50 mL IVPB     2 g 100 mL/hr over 30 Minutes Intravenous Every 24 hours 11/23/16 2140     11/23/16 0145  ciprofloxacin (CIPRO) IVPB 400 mg  Status:  Discontinued     400 mg 200 mL/hr over 60 Minutes Intravenous 2 times daily 11/23/16 0130 11/23/16 2139   11/23/16 0130  metroNIDAZOLE (FLAGYL) IVPB 500 mg     500 mg 100 mL/hr over 60 Minutes Intravenous Every 8 hours 11/23/16 0112  Subjective: Complains of L shoulder pain  Objective: Vitals:   11/24/16 1346 11/24/16 2023 11/25/16 0035 11/25/16 0400  BP: 140/81 (!) 151/86 (!) 142/84 140/81  Pulse: 75 83 95 88  Resp: 20 20 20 20   Temp: 99 F (37.2 C) 98.8 F (37.1 C) 98.6 F (37 C) 98 F (36.7 C)  TempSrc: Oral Oral Oral Oral  SpO2: 99% 99% 96% 97%  Weight:      Height:        Intake/Output Summary (Last 24 hours) at 11/25/16 1555 Last data  filed at 11/25/16 0900  Gross per 24 hour  Intake              490 ml  Output             2300 ml  Net            -1810 ml   Filed Weights   11/22/16 1806 11/23/16 0146  Weight: 76.7 kg (169 lb) 75 kg (165 lb 5.5 oz)    Examination:  General exam: Sitting in chair, conversant, in nad Respiratory system: normal resp effort, no audible wheezing Cardiovascular system: regular rhythm, s1-s2 Gastrointestinal system: pos BS, soft, nondistended. Central nervous system: no seizures, no tremors Extremities: no joint deformities, no clubbing Skin: no pallor, no rashes Psychiatry: affect normal// no auditory hallucinations   Data Reviewed: I have personally reviewed following labs and imaging studies  CBC:  Recent Labs Lab 11/22/16 1915 11/23/16 0130 11/24/16 0522 11/25/16 0510  WBC 12.6* 13.3* 7.0 6.2  NEUTROABS 11.4* 11.4*  --  4.2  HGB 11.5* 10.5* 10.1* 9.9*  HCT 33.1* 31.4* 30.0* 29.2*  MCV 96.2 96.3 96.8 94.2  PLT 124* 121* 102* [id number]*   Basic Metabolic Panel:  Recent Labs Lab 11/22/16 1915 11/23/16 0130 11/24/16 0522 11/25/16 0510  NA 139 138 136 134*  K 4.1 4.4 3.3* 2.7*  CL 108 107 105 100*  CO2 19* 25 22 25   GLUCOSE 125* 137* 128* 114*  BUN 19 19 15 11   CREATININE 1.49* 1.43* 1.29* 1.24  CALCIUM 8.8* 8.4* 8.4* 8.3*   GFR: Estimated Creatinine Clearance: 45 mL/min (by C-G formula based on SCr of 1.24 mg/dL). Liver Function Tests:  Recent Labs Lab 11/22/16 1915 11/23/16 0130 11/24/16 0522 11/25/16 0510  AST 960* 828* 528* 281*  ALT 300* 301* 270* 216*  ALKPHOS 342* 321* 293* 299*  BILITOT 3.1* 3.9* 1.6* 1.7*  PROT 6.9 6.8 6.2* 6.0*  ALBUMIN 3.8 3.6 3.3* 3.1*    Recent Labs Lab 11/22/16 1915  LIPASE 16    Recent Labs Lab 11/22/16 1915  AMMONIA 35   Coagulation Profile:  Recent Labs Lab 11/22/16 1915  INR 1.05   Cardiac Enzymes: No results for input(s): CKTOTAL, CKMB, CKMBINDEX, TROPONINI in the last 168 hours. BNP (last 3  results) No results for input(s): PROBNP in the last 8760 hours. HbA1C: No results for input(s): HGBA1C in the last 72 hours. CBG: No results for input(s): GLUCAP in the last 168 hours. Lipid Profile: No results for input(s): CHOL, HDL, LDLCALC, TRIG, CHOLHDL, LDLDIRECT in the last 72 hours. Thyroid Function Tests: No results for input(s): TSH, T4TOTAL, FREET4, T3FREE, THYROIDAB in the last 72 hours. Anemia Panel: No results for input(s): VITAMINB12, FOLATE, FERRITIN, TIBC, IRON, RETICCTPCT in the last 72 hours. Sepsis Labs:  Recent Labs Lab 11/22/16 1934 11/22/16 2326 11/23/16 0130  PROCALCITON  --   --  3.17  LATICACIDVEN 3.35* 2.02* 1.2    Recent Results (from  the past 240 hour(s))  Culture, blood (x 2)     Status: Abnormal   Collection Time: 11/23/16  1:30 AM  Result Value Ref Range Status   Specimen Description BLOOD RIGHT ANTECUBITAL  Final   Special Requests IN PEDIATRIC BOTTLE 3CC  Final   Culture  Setup Time   Final    GRAM NEGATIVE RODS IN PEDIATRIC BOTTLE CRITICAL RESULT CALLED TO, READ BACK BY AND VERIFIED WITH: J.GRIMSLEY, PHARM AT [facility], 11/23/16 AT 2125 BY J FUDESCO Performed at [facility], [place] [address], [place], [place] [address]    Culture ESCHERICHIA COLI (A)  Final   Report Status 11/25/2016 FINAL  Final   Organism ID, Bacteria ESCHERICHIA COLI  Final      Susceptibility   Escherichia coli - MIC*    AMPICILLIN <=2 SENSITIVE Sensitive     CEFAZOLIN <=4 SENSITIVE Sensitive     CEFEPIME <=1 SENSITIVE Sensitive     CEFTAZIDIME <=1 SENSITIVE Sensitive     CEFTRIAXONE <=1 SENSITIVE Sensitive     CIPROFLOXACIN <=0.25 SENSITIVE Sensitive     GENTAMICIN <=1 SENSITIVE Sensitive     IMIPENEM <=0.25 SENSITIVE Sensitive     TRIMETH/SULFA <=20 SENSITIVE Sensitive     AMPICILLIN/SULBACTAM <=2 SENSITIVE Sensitive     PIP/TAZO <=4 SENSITIVE Sensitive     Extended ESBL NEGATIVE Sensitive     * ESCHERICHIA COLI  Culture, blood (x 2)     Status: None  (Preliminary result)   Collection Time: 11/23/16  1:30 AM  Result Value Ref Range Status   Specimen Description BLOOD RIGHT FOREARM  Final   Special Requests BOTTLES DRAWN AEROBIC AND ANAEROBIC 5CC  Final   Culture   Final    NO GROWTH 2 DAYS Performed at [facility], [address] [place], [place] [address]    Report Status PENDING  Incomplete  Blood Culture ID Panel (Reflexed)     Status: Abnormal   Collection Time: 11/23/16  1:30 AM  Result Value Ref Range Status   Enterococcus species NOT DETECTED NOT DETECTED Final   Listeria monocytogenes NOT DETECTED NOT DETECTED Final   Staphylococcus species NOT DETECTED NOT DETECTED Final   Staphylococcus aureus NOT DETECTED NOT DETECTED Final   Streptococcus species NOT DETECTED NOT DETECTED Final   Streptococcus agalactiae NOT DETECTED NOT DETECTED Final   Streptococcus pneumoniae NOT DETECTED NOT DETECTED Final   Streptococcus pyogenes NOT DETECTED NOT DETECTED Final   Acinetobacter baumannii NOT DETECTED NOT DETECTED Final   Enterobacteriaceae species DETECTED (A) NOT DETECTED Final    Comment: Enterobacteriaceae represent a large family of gram-negative bacteria, not a single organism. CRITICAL RESULT CALLED TO, READ BACK BY AND VERIFIED WITH: Lavell Luster, PHARM AT [facility] 11/23/16 AT 2125 BY J FUDESCO    Enterobacter cloacae complex NOT DETECTED NOT DETECTED Final   Escherichia coli DETECTED (A) NOT DETECTED Final    Comment: CRITICAL RESULT CALLED TO, READ BACK BY AND VERIFIED WITH: Lavell Luster, PHARM AT [facility] 11/23/16 AT 2125 BY J FUDESCO    Klebsiella oxytoca NOT DETECTED NOT DETECTED Final   Klebsiella pneumoniae NOT DETECTED NOT DETECTED Final   Proteus species NOT DETECTED NOT DETECTED Final   Serratia marcescens NOT DETECTED NOT DETECTED Final   Carbapenem resistance NOT DETECTED NOT DETECTED Final   Haemophilus influenzae NOT DETECTED NOT DETECTED Final   Neisseria meningitidis NOT DETECTED NOT DETECTED Final    Pseudomonas aeruginosa NOT DETECTED NOT DETECTED Final   Candida albicans NOT DETECTED  NOT DETECTED Final   Candida glabrata NOT DETECTED NOT DETECTED Final   Candida krusei NOT DETECTED NOT DETECTED Final   Candida parapsilosis NOT DETECTED NOT DETECTED Final   Candida tropicalis NOT DETECTED NOT DETECTED Final    Comment: Performed at [facility], [place] [address], [place], [place] [address]     Radiology Studies: Dg Lumbar Spine 2-3 Views  Result Date: 11/24/2016 CLINICAL DATA:  Lumbago with left lower extremity radicular symptoms EXAM: LUMBAR SPINE - 2-3 VIEW COMPARISON:  Mar 05, 2010 FINDINGS: Frontal, lateral, and spot lumbosacral lateral images were obtained. There are 5 non-rib-bearing lumbar type vertebral bodies. There is thoracolumbar levoscoliosis. There is no fracture or spondylolisthesis. Disc spaces appear unremarkable. No erosive change. No blastic or lytic lesions evident. There are seed implants in prostate. There is a stent in the biliary region on the right. There is atherosclerotic calcification in the aorta. IMPRESSION: Scoliosis. No fracture or spondylolisthesis. No appreciable arthropathy. No blastic or lytic lesions. No areas stent present. Seed implants in prostate. There is aortic atherosclerosis. Electronically Signed   By: Lowella Grip III M.D.   On: 11/24/2016 12:58    Scheduled Meds: . cefTRIAXone (ROCEPHIN)  IV  2 g Intravenous Q24H  . folic acid  1 mg Oral Daily  . metronidazole  500 mg Intravenous Q8H  . mirabegron ER  50 mg Oral Daily  . multivitamin with minerals  1 tablet Oral Daily  . pantoprazole  40 mg Oral Daily  . polyvinyl alcohol  1 drop Both Eyes BID  . potassium chloride  30 mEq Oral BID  . thiamine  100 mg Oral Daily   Continuous Infusions:   LOS: 3 days   Leanna Hamid, Orpah Melter, MD [facility] Pager [phone]  If 7PM-7AM, please contact night-coverage [email or web address] Password [email or web address] 11/25/2016, 3:55 PM

## 2016-11-25 NOTE — Progress Notes (Signed)
[facility] Progress Note  Subjective: Patient feeling okay, mainly complaining of shoulder pain for which MRI scheduled today. He is tolerating a regular diet.  Objective: Vital signs in last 24 hours: Temp:  [98 F (36.7 C)-99 F (37.2 C)] 98 F (36.7 C) (02/19 0400) Pulse Rate:  [75-95] 88 (02/19 0400) Resp:  [20] 20 (02/19 0400) BP: (140-151)/(81-86) 140/81 (02/19 0400) SpO2:  [96 %-99 %] 97 % (02/19 0400) Weight change:    PE: Abdomen soft nontender  Lab Results: Results for orders placed or performed during the hospital encounter of 11/22/16 (from the past 24 hour(s))  Comprehensive metabolic panel     Status: Abnormal   Collection Time: 11/25/16  5:10 AM  Result Value Ref Range   Sodium 134 (L) 135 - 145 mmol/L   Potassium 2.7 (LL) 3.5 - 5.1 mmol/L   Chloride 100 (L) 101 - 111 mmol/L   CO2 25 22 - 32 mmol/L   Glucose, Bld 114 (H) 65 - 99 mg/dL   BUN 11 6 - 20 mg/dL   Creatinine, Ser 1.24 0.61 - 1.24 mg/dL   Calcium 8.3 (L) 8.9 - 10.3 mg/dL   Total Protein 6.0 (L) 6.5 - 8.1 g/dL   Albumin 3.1 (L) 3.5 - 5.0 g/dL   AST 281 (H) 15 - 41 U/L   ALT 216 (H) 17 - 63 U/L   Alkaline Phosphatase 299 (H) 38 - 126 U/L   Total Bilirubin 1.7 (H) 0.3 - 1.2 mg/dL   GFR calc non Af Amer 51 (L) >60 mL/min   GFR calc Af Amer 59 (L) >60 mL/min   Anion gap 9 5 - 15  CBC with Differential/Platelet     Status: Abnormal   Collection Time: 11/25/16  5:10 AM  Result Value Ref Range   WBC 6.2 4.0 - 10.5 K/uL   RBC 3.10 (L) 4.22 - 5.81 MIL/uL   Hemoglobin 9.9 (L) 13.0 - 17.0 g/dL   HCT 29.2 (L) 39.0 - 52.0 %   MCV 94.2 78.0 - 100.0 fL   MCH 31.9 26.0 - 34.0 pg   MCHC 33.9 30.0 - 36.0 g/dL   RDW 13.2 11.5 - 15.5 %   Platelets 101 (L) 150 - 400 K/uL   Neutrophils Relative % 69 %   Lymphocytes Relative 18 %   Monocytes Relative 11 %   Eosinophils Relative 1 %   Basophils Relative 1 %   Neutro Abs 4.2 1.7 - 7.7 K/uL   Lymphs Abs 1.1 0.7 - 4.0 K/uL   Monocytes Absolute 0.7  0.1 - 1.0 K/uL   Eosinophils Absolute 0.1 0.0 - 0.7 K/uL   Basophils Absolute 0.1 0.0 - 0.1 K/uL   WBC Morphology WHITE COUNT CONFIRMED ON SMEAR     Studies/Results: Dg Lumbar Spine 2-3 Views  Result Date: 11/24/2016 CLINICAL DATA:  Lumbago with left lower extremity radicular symptoms EXAM: LUMBAR SPINE - 2-3 VIEW COMPARISON:  Mar 05, 2010 FINDINGS: Frontal, lateral, and spot lumbosacral lateral images were obtained. There are 5 non-rib-bearing lumbar type vertebral bodies. There is thoracolumbar levoscoliosis. There is no fracture or spondylolisthesis. Disc spaces appear unremarkable. No erosive change. No blastic or lytic lesions evident. There are seed implants in prostate. There is a stent in the biliary region on the right. There is atherosclerotic calcification in the aorta. IMPRESSION: Scoliosis. No fracture or spondylolisthesis. No appreciable arthropathy. No blastic or lytic lesions. No areas stent present. Seed implants in prostate. There is aortic atherosclerosis. Electronically Signed   By:  Lowella Grip III M.D.   On: 11/24/2016 12:58      Assessment: 1. Biliary obstruction with increasing suspicion of pancreatic malignancy based on serial scanning. 2. Probable low-grade cholangitis related to long-standing stent with minimal elevations of liver function tests compared to baseline, improving after antibiotics  Plan: 1. Continue antibiotics at least for now 2. Consult with Dr. Paulita Fujita repeat EUS and/or ERCP with stent exchange 3. Will repeat CA-19-9 which I cannot see has been drawn. 4. Further recommendations tomorrow. 5. Wife and daughter in room and understand plan.    Soo Steelman C 11/25/2016, 10:38 AM  Pager [phone] If no answer or after 5 PM call [phone]

## 2016-11-25 NOTE — Progress Notes (Signed)
Physical Therapy Treatment Patient Details Name: [name] [name] MRN: [id number] DOB: [birth year] Today's Date: 11/25/2016    History of Present Illness 83 y.o.gentleman admitted with sepsis. The patient had been in his baseline state of health until the past two weeks. He has had recurrent epigastric pain associated with intermittent nausea and vomiting. PMH: HTN, HLD, CKD 3, chronic anemia, biliary obstruction requiring CBD stent in November 2016.     PT Comments    Progressing with mobility. Pt c/o L UE pain on today-awaiting meds from RN. Pt participated well with therapy. Family present and they continue to confirm plan is for home with HHPT.    Follow Up Recommendations  Home health PT; Intermittent supervision/assistance     Equipment Recommendations  None recommended by PT    Recommendations for Other Services        Precautions / Restrictions Precautions Precautions: Fall Restrictions Weight Bearing Restrictions: No    Mobility  Bed Mobility               General bed mobility comments: oob in recliner  Transfers Overall transfer level: Needs assistance Equipment used: Rolling walker (2 wheeled) Transfers: Sit to/from Stand Sit to Stand: Min guard         General transfer comment: close guard for safety. VCs hand placement. Increased time and multiple atttempts before pt could get to standing unassisted.   Ambulation/Gait Ambulation/Gait assistance: Min guard Ambulation Distance (Feet): 115 Feet Assistive device: Rolling walker (2 wheeled) Gait Pattern/deviations: Step-through pattern;Decreased step length - right;Decreased step length - left;Decreased stride length;Trunk flexed     General Gait Details: Cues for distance from walker and for pt to increased step length bilaterally. Close guard for safety.    Stairs            Wheelchair Mobility    Modified Rankin (Stroke Patients Only)       Balance                                    Cognition Arousal/Alertness: Awake/alert Behavior During Therapy: WFL for tasks assessed/performed Overall Cognitive Status: Within Functional Limits for tasks assessed                      Exercises      General Comments        Pertinent Vitals/Pain Pain Assessment: Faces Faces Pain Scale: Hurts even more Pain Location: L UE Pain Intervention(s): Monitored during session;Repositioned    Home Living                      Prior Function            PT Goals (current goals can now be found in the care plan section) Progress towards PT goals: Progressing toward goals    Frequency    Min 3X/week      PT Plan Current plan remains appropriate    Co-evaluation             End of Session Equipment Utilized During Treatment: Gait belt Activity Tolerance: Patient tolerated treatment well Patient left: in chair;with call bell/phone within reach;with family/visitor present;with chair alarm set   PT Visit Diagnosis: Difficulty in walking, not elsewhere classified (R26.2)     Time: [id number] PT Time Calculation (min) (ACUTE ONLY): 16 min  Charges:  $Gait Training: 8-22 mins  G Codes:       Weston Anna, MPT Pager: [phone]

## 2016-11-26 ENCOUNTER — Inpatient Hospital Stay
Admission: RE | Admit: 2016-11-26 | Discharge: 2016-11-26 | Disposition: A | Discharge status: Home / Self Care | Payer: Medicare Other | Source: Ambulatory Visit | Attending: Physician Assistant | Admitting: Physician Assistant

## 2016-11-26 LAB — COMPREHENSIVE METABOLIC PANEL
ALK PHOS: 407 U/L — AB (ref 38–126)
ALT: 217 U/L — AB (ref 17–63)
ANION GAP: 7 (ref 5–15)
AST: 320 U/L — ABNORMAL HIGH (ref 15–41)
Albumin: 3 g/dL — ABNORMAL LOW (ref 3.5–5.0)
BILIRUBIN TOTAL: 1.9 mg/dL — AB (ref 0.3–1.2)
BUN: 13 mg/dL (ref 6–20)
CALCIUM: 8.5 mg/dL — AB (ref 8.9–10.3)
CO2: 24 mmol/L (ref 22–32)
CREATININE: 1.37 mg/dL — AB (ref 0.61–1.24)
Chloride: 106 mmol/L (ref 101–111)
GFR, EST AFRICAN AMERICAN: 53 mL/min — AB (ref >60)
GFR, EST NON AFRICAN AMERICAN: 45 mL/min — AB (ref >60)
Glucose, Bld: 97 mg/dL (ref 65–99)
Potassium: 3.3 mmol/L — ABNORMAL LOW (ref 3.5–5.1)
Sodium: 137 mmol/L (ref 135–145)
TOTAL PROTEIN: 5.8 g/dL — AB (ref 6.5–8.1)

## 2016-11-26 LAB — CANCER ANTIGEN 19-9: CA 19 9: 3617 U/mL — AB (ref 0–35)

## 2016-11-26 MED ORDER — POTASSIUM CHLORIDE CRYS ER 20 MEQ PO TBCR
40.0000 meq | EXTENDED_RELEASE_TABLET | Freq: Two times a day (BID) | ORAL | Status: AC
  Administered 2016-11-26 (×2): 40 meq via ORAL
  Filled 2016-11-26 (×2): qty 2

## 2016-11-26 MED ORDER — CEFPODOXIME PROXETIL 200 MG PO TABS
200.0000 mg | ORAL_TABLET | Freq: Two times a day (BID) | ORAL | Status: DC
  Administered 2016-11-26 – 2016-11-27 (×3): 200 mg via ORAL
  Filled 2016-11-26 (×3): qty 1

## 2016-11-26 MED ORDER — METRONIDAZOLE 500 MG PO TABS
500.0000 mg | ORAL_TABLET | Freq: Three times a day (TID) | ORAL | Status: DC
  Administered 2016-11-26 – 2016-11-27 (×3): 500 mg via ORAL
  Filled 2016-11-26 (×3): qty 1

## 2016-11-26 NOTE — Progress Notes (Signed)
Report called to Manuela Schwartz, RN.  All questions answered.  Pt transferred to room 1522.  Daughter, Lattie Haw made aware.

## 2016-11-26 NOTE — Progress Notes (Signed)
[facility] Progress Note  Subjective: Feels okay, tolerating diet.  Objective: Vital signs in last 24 hours: Temp:  [97.6 F (36.4 C)-98.8 F (37.1 C)] 98.8 F (37.1 C) (02/20 0657) Pulse Rate:  [74-89] 88 (02/20 0657) Resp:  [18] 18 (02/20 0657) BP: (134-165)/(85-91) 147/91 (02/20 0657) SpO2:  [98 %-99 %] 98 % (02/20 0657) Weight change:    PE: Unchanged  Lab Results: Results for orders placed or performed during the hospital encounter of 11/22/16 (from the past 24 hour(s))  Cancer antigen 19-9     Status: Abnormal   Collection Time: 11/25/16 11:27 AM  Result Value Ref Range   CA 19-9 3,617 (H) 0 - 35 U/mL  Comprehensive metabolic panel     Status: Abnormal   Collection Time: 11/26/16  4:45 AM  Result Value Ref Range   Sodium 137 135 - 145 mmol/L   Potassium 3.3 (L) 3.5 - 5.1 mmol/L   Chloride 106 101 - 111 mmol/L   CO2 24 22 - 32 mmol/L   Glucose, Bld 97 65 - 99 mg/dL   BUN 13 6 - 20 mg/dL   Creatinine, Ser 1.37 (H) 0.61 - 1.24 mg/dL   Calcium 8.5 (L) 8.9 - 10.3 mg/dL   Total Protein 5.8 (L) 6.5 - 8.1 g/dL   Albumin 3.0 (L) 3.5 - 5.0 g/dL   AST 320 (H) 15 - 41 U/L   ALT 217 (H) 17 - 63 U/L   Alkaline Phosphatase 407 (H) 38 - 126 U/L   Total Bilirubin 1.9 (H) 0.3 - 1.2 mg/dL   GFR calc non Af Amer 45 (L) >60 mL/min   GFR calc Af Amer 53 (L) >60 mL/min   Anion gap 7 5 - 15    Studies/Results: Dg Lumbar Spine 2-3 Views  Result Date: 11/24/2016 CLINICAL DATA:  Lumbago with left lower extremity radicular symptoms EXAM: LUMBAR SPINE - 2-3 VIEW COMPARISON:  Mar 05, 2010 FINDINGS: Frontal, lateral, and spot lumbosacral lateral images were obtained. There are 5 non-rib-bearing lumbar type vertebral bodies. There is thoracolumbar levoscoliosis. There is no fracture or spondylolisthesis. Disc spaces appear unremarkable. No erosive change. No blastic or lytic lesions evident. There are seed implants in prostate. There is a stent in the biliary region on the right.  There is atherosclerotic calcification in the aorta. IMPRESSION: Scoliosis. No fracture or spondylolisthesis. No appreciable arthropathy. No blastic or lytic lesions. No areas stent present. Seed implants in prostate. There is aortic atherosclerosis. Electronically Signed   By: Lowella Grip III M.D.   On: 11/24/2016 12:58      Assessment: Probable pancreatic cancer with extremely elevated CA-19-9 Bacteremia probably secondary to Low-grade cholangitis related to chronic indwelling stent  Plan: Long discussion with family. Overall plan is to pursue combined EUS/ERCP once scheduling permits, probably in the next 2 or 3 weeks Continue antibiotics. When anticipation of following day discharge, would switch to oral equivalent the day before and continue for at least 2 weeks or until procedures done. We'll continue to follow and hopefully determine tentative date for procedure(s) prior to discharge.    Walaa Carel C 11/26/2016, 10:41 AM  Pager [phone] If no answer or after 5 PM call [phone]

## 2016-11-26 NOTE — Progress Notes (Addendum)
PROGRESS NOTE    [name] [name]  [id number] DOB: [birth year] DOA: 11/22/2016 PCP: Limmie Patricia, MD    Brief Narrative:  83 y.o. gentleman with a history of HTN, HLD, CKD 3, chronic anemia, GERD, and biliary obstruction requiring CBD stent in November 2016.  The patient has been in his baseline state of health until the past two weeks.  He has had recurrent epigastric pain associated with intermittent nausea and vomiting.  He has been taking acetaminophen, but it has not helped his pain.  He has had decreased appetite and has lost 5 lbs in the past two weeks.  No fevers, chills, or sweats.  No syncope.  Today, he had a self-limited episode of what he describes as left leg numbness and weakness ("I could not move it.")  He was unable to get up from his bed, even with assistance, which is atypical for him.  He did not fall.  His wife called the neighbors, who ultimately notified his daughter.  The patient was brought in by EMS for further evaluation.  No witnessed slurred speech or facial droop.  No difficulty swallowing.  Left leg symptoms resolved spontaneously, without specific intervention.  He has urinary incontinence at baseline; wears depends.  No appreciable difference in urine output.  No odor  During this hospital course, the patient was found to have pan-sensitive ecoli bacteremia. He was continued on rocephin and flagyl. GI consulted with recommendation to transition to PO abx and if stable overnight, then consideration to d/c home with follow up with GI in 2-3 weeks  Assessment & Plan:   Principal Problem:   Sepsis ([place]) Active Problems:   Essential hypertension   Transaminitis   CKD (chronic kidney disease)   Urinary incontinence   Hyperbilirubinemia   History of biliary stent insertion   Pancreatic mass   Left leg weakness  Abdominal pain with low grade fever and abnormal LFTs.//e.coli bacteremia  -- Pancreatic mass noted on CT. Biliary stent remains in  place. --Cipro and flagyl was started empirically. - Cipro was been changed to rocephin --Leukocytosis resolved - GI following. Recommendations to transition to PO abx today. If patient remains stable overnight, then consideration for combined EUS/ERCP in the next 2-3 weeks. - Will change to PO flagyl with vantin  Possible sepsis with low grade fever, tachycardia, elevated lactic acid level, and leukocytosis.with ecoli bacteremia --blood cx pos for pan-sensitive ecoli --Urinalysis unremarkable --Continue abx per above  Left leg weakness, per patient.  Self-limited.  Resolved now. --Head CT unremakrable --PT/OT eval and treat --Fall precautions --Patient has a hx of low back pain and osteoporosis. - lumbar xray ordered and reviewed. Looks to be unremarkable  L shoulder pain - Patient reports limited ROM involving L shoulder with pain - Much improved with NSAID  Daily EtOH use --EtOH cessation advised due to abnormal LFTs --CIWA protocol continued --Folate, thiamine, MVI - without evidence of withdrawals  Urinary incontinence --Myrbetriq - presently stable  GERD --PPI - Currently stable  DVT prophylaxis: SCd's Code Status: Full Family Communication: Pt in room, family at bedside Disposition Plan: Uncertain at this time  Consultants:   GI  Procedures:     Antimicrobials: Anti-infectives    Start     Dose/Rate Route Frequency Ordered Stop   11/26/16 1445  metroNIDAZOLE (FLAGYL) tablet 500 mg     500 mg Oral Every 8 hours 11/26/16 1440     11/26/16 1445  cefpodoxime (VANTIN) tablet 200 mg     200 mg Oral  Every 12 hours 11/26/16 1440     11/23/16 2200  cefTRIAXone (ROCEPHIN) 2 g in dextrose 5 % 50 mL IVPB  Status:  Discontinued     2 g 100 mL/hr over 30 Minutes Intravenous Every 24 hours 11/23/16 2140 11/26/16 1440   11/23/16 0145  ciprofloxacin (CIPRO) IVPB 400 mg  Status:  Discontinued     400 mg 200 mL/hr over 60 Minutes Intravenous 2 times daily  11/23/16 0130 11/23/16 2139   11/23/16 0130  metroNIDAZOLE (FLAGYL) IVPB 500 mg  Status:  Discontinued     500 mg 100 mL/hr over 60 Minutes Intravenous Every 8 hours 11/23/16 0112 11/26/16 1440      Subjective: No complaints  Objective: Vitals:   11/25/16 1500 11/25/16 2027 11/26/16 0657 11/26/16 1420  BP: 134/85 (!) 165/89 (!) 147/91 120/60  Pulse: 89 74 88 70  Resp: 18 18 18 18   Temp: 98.1 F (36.7 C) 97.6 F (36.4 C) 98.8 F (37.1 C) 97.2 F (36.2 C)  TempSrc: Oral Oral Oral Oral  SpO2: 98% 99% 98% 100%  Weight:      Height:        Intake/Output Summary (Last 24 hours) at 11/26/16 1450 Last data filed at 11/26/16 1300  Gross per 24 hour  Intake             1570 ml  Output             1950 ml  Net             -380 ml   Filed Weights   11/22/16 1806 11/23/16 0146  Weight: 76.7 kg (169 lb) 75 kg (165 lb 5.5 oz)    Examination:  General exam: Awake, sitting in chair, in nad Respiratory system: normal chest rise, no wheezing Cardiovascular system: regular rate, s1-2 auscultated Gastrointestinal system: soft, pos BS, nondistended Central nervous system: cn2-12 grossly intact, strength intact Extremities: perfused, no clubbing Skin: normal skin turgor, no notable skin lesions seen Psychiatry: mood normal// no visual hallucinations   Data Reviewed: I have personally reviewed following labs and imaging studies  CBC:  Recent Labs Lab 11/22/16 1915 11/23/16 0130 11/24/16 0522 11/25/16 0510  WBC 12.6* 13.3* 7.0 6.2  NEUTROABS 11.4* 11.4*  --  4.2  HGB 11.5* 10.5* 10.1* 9.9*  HCT 33.1* 31.4* 30.0* 29.2*  MCV 96.2 96.3 96.8 94.2  PLT 124* 121* 102* [id number]*   Basic Metabolic Panel:  Recent Labs Lab 11/22/16 1915 11/23/16 0130 11/24/16 0522 11/25/16 0510 11/26/16 0445  NA 139 138 136 134* 137  K 4.1 4.4 3.3* 2.7* 3.3*  CL 108 107 105 100* 106  CO2 19* 25 22 25 24   GLUCOSE 125* 137* 128* 114* 97  BUN 19 19 15 11 13   CREATININE 1.49* 1.43* 1.29* 1.24  1.37*  CALCIUM 8.8* 8.4* 8.4* 8.3* 8.5*   GFR: Estimated Creatinine Clearance: 40.7 mL/min (by C-G formula based on SCr of 1.37 mg/dL (H)). Liver Function Tests:  Recent Labs Lab 11/22/16 1915 11/23/16 0130 11/24/16 0522 11/25/16 0510 11/26/16 0445  AST 960* 828* 528* 281* 320*  ALT 300* 301* 270* 216* 217*  ALKPHOS 342* 321* 293* 299* 407*  BILITOT 3.1* 3.9* 1.6* 1.7* 1.9*  PROT 6.9 6.8 6.2* 6.0* 5.8*  ALBUMIN 3.8 3.6 3.3* 3.1* 3.0*    Recent Labs Lab 11/22/16 1915  LIPASE 16    Recent Labs Lab 11/22/16 1915  AMMONIA 35   Coagulation Profile:  Recent Labs Lab 11/22/16 1915  INR 1.05  Cardiac Enzymes: No results for input(s): CKTOTAL, CKMB, CKMBINDEX, TROPONINI in the last 168 hours. BNP (last 3 results) No results for input(s): PROBNP in the last 8760 hours. HbA1C: No results for input(s): HGBA1C in the last 72 hours. CBG: No results for input(s): GLUCAP in the last 168 hours. Lipid Profile: No results for input(s): CHOL, HDL, LDLCALC, TRIG, CHOLHDL, LDLDIRECT in the last 72 hours. Thyroid Function Tests: No results for input(s): TSH, T4TOTAL, FREET4, T3FREE, THYROIDAB in the last 72 hours. Anemia Panel: No results for input(s): VITAMINB12, FOLATE, FERRITIN, TIBC, IRON, RETICCTPCT in the last 72 hours. Sepsis Labs:  Recent Labs Lab 11/22/16 1934 11/22/16 2326 11/23/16 0130  PROCALCITON  --   --  3.17  LATICACIDVEN 3.35* 2.02* 1.2    Recent Results (from the past 240 hour(s))  Culture, blood (x 2)     Status: Abnormal   Collection Time: 11/23/16  1:30 AM  Result Value Ref Range Status   Specimen Description BLOOD RIGHT ANTECUBITAL  Final   Special Requests IN PEDIATRIC BOTTLE 3CC  Final   Culture  Setup Time   Final    GRAM NEGATIVE RODS IN PEDIATRIC BOTTLE CRITICAL RESULT CALLED TO, READ BACK BY AND VERIFIED WITH: J.GRIMSLEY, PHARM AT [facility], 11/23/16 AT 2125 BY J FUDESCO Performed at [facility], [place] [address], [place], [place]  [address]    Culture ESCHERICHIA COLI (A)  Final   Report Status 11/25/2016 FINAL  Final   Organism ID, Bacteria ESCHERICHIA COLI  Final      Susceptibility   Escherichia coli - MIC*    AMPICILLIN <=2 SENSITIVE Sensitive     CEFAZOLIN <=4 SENSITIVE Sensitive     CEFEPIME <=1 SENSITIVE Sensitive     CEFTAZIDIME <=1 SENSITIVE Sensitive     CEFTRIAXONE <=1 SENSITIVE Sensitive     CIPROFLOXACIN <=0.25 SENSITIVE Sensitive     GENTAMICIN <=1 SENSITIVE Sensitive     IMIPENEM <=0.25 SENSITIVE Sensitive     TRIMETH/SULFA <=20 SENSITIVE Sensitive     AMPICILLIN/SULBACTAM <=2 SENSITIVE Sensitive     PIP/TAZO <=4 SENSITIVE Sensitive     Extended ESBL NEGATIVE Sensitive     * ESCHERICHIA COLI  Culture, blood (x 2)     Status: None (Preliminary result)   Collection Time: 11/23/16  1:30 AM  Result Value Ref Range Status   Specimen Description BLOOD RIGHT FOREARM  Final   Special Requests BOTTLES DRAWN AEROBIC AND ANAEROBIC 5CC  Final   Culture   Final    NO GROWTH 3 DAYS Performed at [facility], [address], [place],  [address]    Report Status PENDING  Incomplete  Blood Culture ID Panel (Reflexed)     Status: Abnormal   Collection Time: 11/23/16  1:30 AM  Result Value Ref Range Status   Enterococcus species NOT DETECTED NOT DETECTED Final   Listeria monocytogenes NOT DETECTED NOT DETECTED Final   Staphylococcus species NOT DETECTED NOT DETECTED Final   Staphylococcus aureus NOT DETECTED NOT DETECTED Final   Streptococcus species NOT DETECTED NOT DETECTED Final   Streptococcus agalactiae NOT DETECTED NOT DETECTED Final   Streptococcus pneumoniae NOT DETECTED NOT DETECTED Final   Streptococcus pyogenes NOT DETECTED NOT DETECTED Final   Acinetobacter baumannii NOT DETECTED NOT DETECTED Final   Enterobacteriaceae species DETECTED (A) NOT DETECTED Final    Comment: Enterobacteriaceae represent a large family of gram-negative bacteria, not a single organism. CRITICAL RESULT  CALLED TO, READ BACK BY AND VERIFIED WITH: J.  GRIMSLEY, PHARM AT [facility] 11/23/16 AT 2125 BY J FUDESCO    Enterobacter cloacae complex NOT DETECTED NOT DETECTED Final   Escherichia coli DETECTED (A) NOT DETECTED Final    Comment: CRITICAL RESULT CALLED TO, READ BACK BY AND VERIFIED WITH: Lavell Luster, PHARM AT [facility] 11/23/16 AT 2125 BY J FUDESCO    Klebsiella oxytoca NOT DETECTED NOT DETECTED Final   Klebsiella pneumoniae NOT DETECTED NOT DETECTED Final   Proteus species NOT DETECTED NOT DETECTED Final   Serratia marcescens NOT DETECTED NOT DETECTED Final   Carbapenem resistance NOT DETECTED NOT DETECTED Final   Haemophilus influenzae NOT DETECTED NOT DETECTED Final   Neisseria meningitidis NOT DETECTED NOT DETECTED Final   Pseudomonas aeruginosa NOT DETECTED NOT DETECTED Final   Candida albicans NOT DETECTED NOT DETECTED Final   Candida glabrata NOT DETECTED NOT DETECTED Final   Candida krusei NOT DETECTED NOT DETECTED Final   Candida parapsilosis NOT DETECTED NOT DETECTED Final   Candida tropicalis NOT DETECTED NOT DETECTED Final    Comment: Performed at [facility], [place] [address], [place], [place] [address]     Radiology Studies: No results found.  Scheduled Meds: . cefpodoxime  200 mg Oral Q12H  . folic acid  1 mg Oral Daily  . metroNIDAZOLE  500 mg Oral Q8H  . mirabegron ER  50 mg Oral Daily  . multivitamin with minerals  1 tablet Oral Daily  . pantoprazole  40 mg Oral Daily  . polyvinyl alcohol  1 drop Both Eyes BID  . potassium chloride  40 mEq Oral BID  . thiamine  100 mg Oral Daily   Continuous Infusions:   LOS: 4 days   Divine Imber, Orpah Melter, MD [facility] Pager [phone]  If 7PM-7AM, please contact night-coverage [email or web address] Password [email or web address] 11/26/2016, 2:50 PM

## 2016-11-26 NOTE — Progress Notes (Signed)
Occupational Therapy Treatment Patient Details Name: [name] [name] MRN: [id number] DOB: 1928-12-14 Today's Date: 11/26/2016    History of present illness 83 y.o.gentleman admitted with sepsis. The patient had been in his baseline state of health until the past two weeks. He has had recurrent epigastric pain associated with intermittent nausea and vomiting. PMH: HTN, HLD, CKD 3, chronic anemia, biliary obstruction requiring CBD stent in November 2016.    OT comments  daughter present for session  Follow Up Recommendations  Home health OT;Supervision/Assistance - 24 hour    Equipment Recommendations  None recommended by OT    Recommendations for Other Services      Precautions / Restrictions Precautions Precautions: Fall Restrictions Weight Bearing Restrictions: No       Mobility Bed Mobility   Bed Mobility: Sit to Supine       Sit to supine: Min assist      Transfers Overall transfer level: Needs assistance Equipment used: Rolling walker (2 wheeled) Transfers: Sit to/from Stand Sit to Stand: Min assist                  ADL Overall ADL's : Needs assistance/impaired                     Lower Body Dressing: Minimal assistance;Sit to/from stand;Cueing for safety;Cueing for sequencing   Toilet Transfer: Minimal assistance;RW;Ambulation;Cueing for sequencing;Cueing for safety;Regular Toilet   Toileting- Clothing Manipulation and Hygiene: Sit to/from stand;Cueing for safety;Cueing for sequencing;Minimal assistance       Functional mobility during ADLs: Minimal assistance;Cueing for safety;Cueing for sequencing;Rolling walker General ADL Comments: MAX VC for using walker safely. daugther present and states pt will have 24/7 A at home. OT did note some confusion and daughter agreed.                Cognition   Behavior During Therapy: WFL for tasks assessed/performed Overall Cognitive Status: Impaired/Different from baseline                   General Comments: some confusion noted.                 Frequency  Min 2X/week        Progress Toward Goals  OT Goals(current goals can now be found in the care plan section)  Progress towards OT goals: Progressing toward goals     Plan Discharge plan remains appropriate       End of Session Equipment Utilized During Treatment: Rolling walker  OT Visit Diagnosis: Unsteadiness on feet (R26.81);Muscle weakness (generalized) (M62.81)   Activity Tolerance Patient tolerated treatment well   Patient Left in chair;with call bell/phone within reach;with chair alarm set;with family/visitor present   Nurse Communication Mobility status        Time: 1340-1402 OT Time Calculation (min): 22 min  Charges: OT General Charges $OT Visit: 1 Procedure OT Treatments $Self Care/Home Management : 8-22 mins  Kari Baars, [place]   Payton Mccallum D 11/26/2016, 2:08 PM

## 2016-11-27 ENCOUNTER — Inpatient Hospital Stay (HOSPITAL_COMMUNITY): Payer: Medicare Other

## 2016-11-27 DIAGNOSIS — Z9889 Other specified postprocedural states: Secondary | ICD-10-CM

## 2016-11-27 DIAGNOSIS — R740 Nonspecific elevation of levels of transaminase and lactic acid dehydrogenase [LDH]: Secondary | ICD-10-CM

## 2016-11-27 DIAGNOSIS — I10 Essential (primary) hypertension: Secondary | ICD-10-CM

## 2016-11-27 DIAGNOSIS — N183 Chronic kidney disease, stage 3 (moderate): Secondary | ICD-10-CM

## 2016-11-27 DIAGNOSIS — A419 Sepsis, unspecified organism: Secondary | ICD-10-CM

## 2016-11-27 LAB — COMPREHENSIVE METABOLIC PANEL
ALT: 166 U/L — ABNORMAL HIGH (ref 17–63)
AST: 138 U/L — AB (ref 15–41)
Albumin: 3.3 g/dL — ABNORMAL LOW (ref 3.5–5.0)
Alkaline Phosphatase: 363 U/L — ABNORMAL HIGH (ref 38–126)
Anion gap: 5 (ref 5–15)
BILIRUBIN TOTAL: 0.9 mg/dL (ref 0.3–1.2)
BUN: 11 mg/dL (ref 6–20)
CO2: 25 mmol/L (ref 22–32)
CREATININE: 1.35 mg/dL — AB (ref 0.61–1.24)
Calcium: 8.9 mg/dL (ref 8.9–10.3)
Chloride: 105 mmol/L (ref 101–111)
GFR calc Af Amer: 54 mL/min — ABNORMAL LOW (ref >60)
GFR, EST NON AFRICAN AMERICAN: 46 mL/min — AB (ref >60)
Glucose, Bld: 117 mg/dL — ABNORMAL HIGH (ref 65–99)
Potassium: 4.3 mmol/L (ref 3.5–5.1)
Sodium: 135 mmol/L (ref 135–145)
Total Protein: 6.2 g/dL — ABNORMAL LOW (ref 6.5–8.1)

## 2016-11-27 MED ORDER — THIAMINE HCL 100 MG PO TABS: 100.0000 mg | ORAL_TABLET | Freq: Every day | ORAL | 2 refills | Status: AC

## 2016-11-27 MED ORDER — IBUPROFEN 200 MG PO TABS
400.0000 mg | ORAL_TABLET | Freq: Once | ORAL | Status: AC
  Administered 2016-11-27: 400 mg via ORAL

## 2016-11-27 MED ORDER — FOLIC ACID 1 MG PO TABS: 1.0000 mg | ORAL_TABLET | Freq: Every day | ORAL | 2 refills | Status: AC

## 2016-11-27 MED ORDER — METRONIDAZOLE 500 MG PO TABS: 500.0000 mg | ORAL_TABLET | Freq: Three times a day (TID) | ORAL | 0 refills | Status: DC

## 2016-11-27 MED ORDER — CEFPODOXIME PROXETIL 200 MG PO TABS: 200.0000 mg | ORAL_TABLET | Freq: Two times a day (BID) | ORAL | 0 refills | Status: DC

## 2016-11-27 NOTE — Care Management Important Message (Signed)
Important Message  Patient Details  Name: [name] [name] MRN: [id number] Date of Birth: August 29, 1929   Medicare Important Message Given:  Yes    Kerin Salen 11/27/2016, 11:02 AMImportant Message  Patient Details  Name: [name] [name] MRN: [id number] Date of Birth: 02/13/1929   Medicare Important Message Given:  Yes    Kerin Salen 11/27/2016, 11:01 AM

## 2016-11-27 NOTE — Progress Notes (Signed)
[facility] Progress Note  Subjective: No GI complaints. Getting shoulder x-ray. Daughter says he is going home today has been switched to oral antibiotics.  Objective: Vital signs in last 24 hours: Temp:  [97.2 F (36.2 C)-99 F (37.2 C)] 99 F (37.2 C) (02/21 0530) Pulse Rate:  [70-89] 89 (02/21 0530) Resp:  [18] 18 (02/21 0530) BP: (94-137)/(45-70) 137/56 (02/21 0530) SpO2:  [98 %-100 %] 98 % (02/21 0530) Weight change:    PE: unChanged  Lab Results: Results for orders placed or performed during the hospital encounter of 11/22/16 (from the past 24 hour(s))  Comprehensive metabolic panel     Status: Abnormal   Collection Time: 11/27/16  6:12 AM  Result Value Ref Range   Sodium 135 135 - 145 mmol/L   Potassium 4.3 3.5 - 5.1 mmol/L   Chloride 105 101 - 111 mmol/L   CO2 25 22 - 32 mmol/L   Glucose, Bld 117 (H) 65 - 99 mg/dL   BUN 11 6 - 20 mg/dL   Creatinine, Ser 1.35 (H) 0.61 - 1.24 mg/dL   Calcium 8.9 8.9 - 10.3 mg/dL   Total Protein 6.2 (L) 6.5 - 8.1 g/dL   Albumin 3.3 (L) 3.5 - 5.0 g/dL   AST 138 (H) 15 - 41 U/L   ALT 166 (H) 17 - 63 U/L   Alkaline Phosphatase 363 (H) 38 - 126 U/L   Total Bilirubin 0.9 0.3 - 1.2 mg/dL   GFR calc non Af Amer 46 (L) >60 mL/min   GFR calc Af Amer 54 (L) >60 mL/min   Anion gap 5 5 - 15    Studies/Results: No results found.    Assessment: 1.Biliary obstruction with increasing suspicion of pancreatic malignancy based on serial scanning [id number] 2. Probable low-grade cholangitis related to long-standing stent with minimal elevations of liver function tests compared to baseline, improving after antibiotics    Plan: 1. Switched over to oral antibiotics, tentatively plan discharge today. 2. Dr. Paulita Fujita will contact set up combined EUS/ERCP.    Corrisa Gibby C 11/27/2016, 9:57 AM  Pager [phone] If no answer or after 5 PM call [phone]

## 2016-11-27 NOTE — Discharge Summary (Signed)
Physician Discharge Summary  [name] [name] [id number] DOB: 1929/04/08 DOA: 11/22/2016  PCP: Limmie Patricia, MD  Admit date: 11/22/2016 Discharge date: 11/27/2016  Time spent: 25* minutes  Recommendations for Outpatient Follow-up:  1. Follow up GI as outpatient in 2 weeks 2. Continue Vantin and Flagyl as prescribed for one more week   Discharge Diagnoses:  Principal Problem:   Sepsis ([place]) Active Problems:   Essential hypertension   Transaminitis   CKD (chronic kidney disease)   Urinary incontinence   Hyperbilirubinemia   History of biliary stent insertion   Pancreatic mass   Left leg weakness   Discharge Condition: Stable  Diet recommendation: Regular diet  Filed Weights   11/22/16 1806 11/23/16 0146  Weight: 76.7 kg (169 lb) 75 kg (165 lb 5.5 oz)    History of present illness:  83 y.o.gentleman with a history of HTN, HLD, CKD 3, chronic anemia, GERD, and biliary obstruction requiring CBD stent in November 2016. The patient has been in his baseline state of health until the past two weeks. He has had recurrent epigastric pain associated with intermittent nausea and vomiting. He has been taking acetaminophen, but it has not helped his pain. He has had decreased appetite and has lost 5 lbs in the past two weeks. No fevers, chills, or sweats. No syncope. Today, he had a self-limited episode of what he describes as left leg numbness and weakness ("I could not move it.") He was unable to get up from his bed, even with assistance, which is atypical for him. He did not fall. His wife called the neighbors, who ultimately notified his daughter. The patient was brought in by EMS for further evaluation.   Hospital Course:   Abdominal pain with low grade fever and abnormal LFTs.//e.coli bacteremia -- Pancreatic mass noted on CT. Biliary stent remains in place. --Cipro and flagyl was started empirically. - Cipro was been changed to rocephin --Leukocytosis  resolved - GI following. Recommended too transition to PO abx .  Patient started on Po Vantin 200 mg po bid and Flagyl 500 mg po TID  x one more week . Consideration for combined EUS/ERCP in the next 2-3 weeks. Will follow up GI in 2 weeks  Dr Amedeo Plenty has seen the patient and is OK with discharge.  Possible sepsis with low grade fever, tachycardia, elevated lactic acid level, and leukocytosis.with ecoli bacteremia --blood cx pos for pan-sensitive ecoli --Urinalysis unremarkable --Continue abx per above for total 2 weeks of treatment  Left leg weakness, per patient. Self-limited. Resolved now. --Head CT unremakrable --PT/OT eval and treat --Fall precautions --Patient has a hx of low back pain and osteoporosis. - lumbar xray ordered and reviewed. Looks to be unremarkable  L shoulder pain - Patient reports limited ROM involving L shoulder with pain - Much improved with NSAIDs. Would avoid NSAIDs due to renal insufficiency.Continue prn Acetaminophen. Xray shoulder shows mild osteoarthritis  Daily alcohol use --EtOH cessation advised due to abnormal LFTs --Folate, thiamine, MVI as outpatient - without evidence of withdrawals  Urinary incontinence --Myrbetriq - presently stable  GERD --PPI - Currently stable   Procedures: None   Consultations:  GI  Discharge Exam: Vitals:   11/27/16 0103 11/27/16 0530  BP: (!) 94/45 (!) 137/56  Pulse: 84 89  Resp: 18 18  Temp: 99 F (37.2 C) 99 F (37.2 C)    General: Appears in no acute distress Cardiovascular:RRR, S1S2 Respiratory: Clear bilaterally  Discharge Instructions   Discharge Instructions    Diet - low  sodium heart healthy    Complete by:  As directed    Increase activity slowly    Complete by:  As directed      Current Discharge Medication List    START taking these medications   Details  cefpodoxime (VANTIN) 200 MG tablet Take 1 tablet (200 mg total) by mouth every 12 (twelve) hours. Qty: 14  tablet, Refills: 0    folic acid (FOLVITE) 1 MG tablet Take 1 tablet (1 mg total) by mouth daily. Qty: 30 tablet, Refills: 2    metroNIDAZOLE (FLAGYL) 500 MG tablet Take 1 tablet (500 mg total) by mouth every 8 (eight) hours. Qty: 21 tablet, Refills: 0    thiamine 100 MG tablet Take 1 tablet (100 mg total) by mouth daily. Qty: 30 tablet, Refills: 2      CONTINUE these medications which have NOT CHANGED   Details  acetaminophen (TYLENOL) 500 MG tablet Take 1,000 mg by mouth every 6 (six) hours as needed for mild pain, moderate pain or headache.     calcium carbonate (OSCAL) 1500 (600 Ca) MG TABS tablet Take 600 mg of elemental calcium by mouth daily with breakfast.    Multiple Vitamin (MULTIVITAMIN WITH MINERALS) TABS tablet Take 1 tablet by mouth daily.    Multiple Vitamins-Minerals (PRESERVISION AREDS 2+MULTI VIT) CAPS Take 1 capsule by mouth daily.    MYRBETRIQ 50 MG TB24 tablet Take 50 mg by mouth daily.    omeprazole (PRILOSEC) 40 MG capsule Take 40 mg by mouth daily.    Polyethyl Glycol-Propyl Glycol (SYSTANE) 0.4-0.3 % SOLN Place 1 drop into both eyes 2 (two) times daily.       Allergies  Allergen Reactions  . Penicillins Hives and Other (See Comments)    Has patient had a PCN reaction causing immediate rash, facial/tongue/throat swelling, SOB or lightheadedness with hypotension: No Has patient had a PCN reaction causing severe rash involving mucus membranes or skin necrosis: No Has patient had a PCN reaction that required hospitalization No Has patient had a PCN reaction occurring within the last 10 years: No If all of the above answers are "NO", then may proceed with Cephalosporin use.  Marland Kitchen Oxycontin [Oxycodone Hcl] Other (See Comments)    Reaction:  Altered mental status       The results of significant diagnostics from this hospitalization (including imaging, microbiology, ancillary and laboratory) are listed below for reference.    Significant Diagnostic  Studies: Ct Abdomen Pelvis W Wo Contrast  Result Date: 11/22/2016 CLINICAL DATA:  History of common bile duct stricture and potential pancreatic head mass. Common bile duct stent placed 09/06/2015. EXAM: CT ABDOMEN AND PELVIS WITHOUT AND WITH CONTRAST TECHNIQUE: Multidetector CT imaging of the abdomen and pelvis was performed following the standard protocol before and following the bolus administration of intravenous contrast. CONTRAST:  1 ISOVUE-300 IOPAMIDOL (ISOVUE-300) INJECTION 61%, 1 ISOVUE-300 IOPAMIDOL (ISOVUE-300) INJECTION 61% COMPARISON:  PET-CT 10/24/2015 FINDINGS: Lower chest:  Basilar atelectasis bilaterally. Hepatobiliary: The liver shows diffusely decreased attenuation suggesting steatosis. No focal abnormality within the liver parenchyma. No substantial intrahepatic biliary duct dilatation. Pneumobilia is compatible with the presence of the common bile duct stent. Gallbladder unremarkable. Metallic common bile duct stent appears appropriately positioned. Pancreas: Soft tissue fullness noted in the uncinate process of the pancreas, more pronounced then on prior imaging studies. Soft tissue now appears more masslike and measures 2.6 x 3.4 cm. Diffuse dilatation of the main pancreatic duct is new since PET-CT of 10/24/2015. Spleen: No splenomegaly. No focal  mass lesion. Adrenals/Urinary Tract: No adrenal nodule or mass. Cortical thinning noted both kidneys. No hydronephrosis. Stomach/Bowel: Small hiatal hernia. Stomach otherwise unremarkable. Duodenal wall thickening noted in the region of the stent device. Visualize small bowel loops and colonic segments of the abdomen are unremarkable. Vascular/Lymphatic: There is abdominal aortic atherosclerosis without aneurysm. There is no gastrohepatic or hepatoduodenal ligament lymphadenopathy. No intraperitoneal or retroperitoneal lymphadenopathy. Other: No intraperitoneal free fluid. Musculoskeletal: Old rib fractures are noted bilaterally. IMPRESSION: 1.  Progression of soft tissue fullness in the uncinate process of the pancreas, now appearing more masslike and measuring 2.6 x 3.4 cm. 2. Interval development of diffuse dilatation main pancreatic duct. 3. Metallic common bile duct stent appears appropriately positioned without substantial intra or extrahepatic biliary duct dilatation. Pneumobilia is associated. 4.  Abdominal Aortic Atherosclerois (ICD10-170.0) Electronically Signed   By: Misty Stanley M.D.   On: 11/22/2016 21:51   Dg Chest 2 View  Result Date: 11/22/2016 CLINICAL DATA:  Acute onset of generalized abdominal pain and sepsis. Initial encounter. EXAM: CHEST  2 VIEW COMPARISON:  Chest radiograph performed 09/01/2015, and PET/CT performed 10/24/2015 FINDINGS: The lungs are well-aerated. Bilateral airspace opacification is concerning for multifocal pneumonia. No pleural effusion or pneumothorax is seen. The heart is borderline enlarged. No acute osseous abnormalities are seen. Chronic right-sided rib deformities are noted. IMPRESSION: 1. Bilateral airspace opacification, concerning for multifocal pneumonia. Followup PA and lateral chest X-ray is recommended in 3-4 weeks following trial of antibiotic therapy to ensure resolution and exclude underlying malignancy. 2. Borderline cardiomegaly. Electronically Signed   By: Garald Balding M.D.   On: 11/22/2016 23:41   Dg Lumbar Spine 2-3 Views  Result Date: 11/24/2016 CLINICAL DATA:  Lumbago with left lower extremity radicular symptoms EXAM: LUMBAR SPINE - 2-3 VIEW COMPARISON:  Mar 05, 2010 FINDINGS: Frontal, lateral, and spot lumbosacral lateral images were obtained. There are 5 non-rib-bearing lumbar type vertebral bodies. There is thoracolumbar levoscoliosis. There is no fracture or spondylolisthesis. Disc spaces appear unremarkable. No erosive change. No blastic or lytic lesions evident. There are seed implants in prostate. There is a stent in the biliary region on the right. There is atherosclerotic  calcification in the aorta. IMPRESSION: Scoliosis. No fracture or spondylolisthesis. No appreciable arthropathy. No blastic or lytic lesions. No areas stent present. Seed implants in prostate. There is aortic atherosclerosis. Electronically Signed   By: Lowella Grip III M.D.   On: 11/24/2016 12:58   Ct Head Wo Contrast  Result Date: 11/22/2016 CLINICAL DATA:  Acute onset of left leg weakness. Initial encounter. EXAM: CT HEAD WITHOUT CONTRAST TECHNIQUE: Contiguous axial images were obtained from the base of the skull through the vertex without intravenous contrast. COMPARISON:  CT of the head performed 02/03/2011, and MRI of the brain performed 02/04/2011 FINDINGS: Brain: No evidence of acute infarction, hemorrhage, hydrocephalus, extra-axial collection or mass lesion/mass effect. Prominence of the ventricles and sulci reflects moderately severe cortical volume loss. A large cavum septum pellucidum is noted. Scattered periventricular white matter change likely reflects small vessel ischemic microangiopathy. Mild cerebellar atrophy is noted. Mild chronic ischemic change is seen at the external capsule bilaterally. The brainstem and fourth ventricle are within normal limits. The cerebral hemispheres demonstrate grossly normal gray-white differentiation. No mass effect or midline shift is seen. Vascular: No hyperdense vessel or unexpected calcification. Skull: There is no evidence of fracture; visualized osseous structures are unremarkable in appearance. Sinuses/Orbits: The orbits are within normal limits. The paranasal sinuses and mastoid air cells are well-aerated. Other: No  significant soft tissue abnormalities are seen. IMPRESSION: 1. No acute intracranial pathology seen on CT. 2. Moderately severe cortical volume loss and scattered small vessel ischemic microangiopathy. 3. Mild chronic ischemic change at the external capsule bilaterally. Electronically Signed   By: Garald Balding M.D.   On: 11/22/2016  23:50   Dg Shoulder Left  Result Date: 11/27/2016 CLINICAL DATA:  Pain for 5 days EXAM: LEFT SHOULDER - 2+ VIEW COMPARISON:  February 04, 2011 FINDINGS: Frontal, Y scapular, and axillary images were obtained. No acute fracture or dislocation. There is stable slight osteoarthritic change in the acromioclavicular joint. The glenohumeral joint appears unremarkable. No erosive change. There is evidence of old trauma in the mid humerus with healing. Visualized left lung clear. IMPRESSION: Slight osteoarthritic change in the acromioclavicular joint. No fracture or dislocation. Electronically Signed   By: Lowella Grip III M.D.   On: 11/27/2016 10:22    Microbiology: Recent Results (from the past 240 hour(s))  Culture, blood (x 2)     Status: Abnormal   Collection Time: 11/23/16  1:30 AM  Result Value Ref Range Status   Specimen Description BLOOD RIGHT ANTECUBITAL  Final   Special Requests IN PEDIATRIC BOTTLE 3CC  Final   Culture  Setup Time   Final    GRAM NEGATIVE RODS IN PEDIATRIC BOTTLE CRITICAL RESULT CALLED TO, READ BACK BY AND VERIFIED WITH: J.GRIMSLEY, PHARM AT [facility], 11/23/16 AT 2125 BY J FUDESCO Performed at [facility], [place] [address] [place], [place] [address]    Culture ESCHERICHIA COLI (A)  Final   Report Status 11/25/2016 FINAL  Final   Organism ID, Bacteria ESCHERICHIA COLI  Final      Susceptibility   Escherichia coli - MIC*    AMPICILLIN <=2 SENSITIVE Sensitive     CEFAZOLIN <=4 SENSITIVE Sensitive     CEFEPIME <=1 SENSITIVE Sensitive     CEFTAZIDIME <=1 SENSITIVE Sensitive     CEFTRIAXONE <=1 SENSITIVE Sensitive     CIPROFLOXACIN <=0.25 SENSITIVE Sensitive     GENTAMICIN <=1 SENSITIVE Sensitive     IMIPENEM <=0.25 SENSITIVE Sensitive     TRIMETH/SULFA <=20 SENSITIVE Sensitive     AMPICILLIN/SULBACTAM <=2 SENSITIVE Sensitive     PIP/TAZO <=4 SENSITIVE Sensitive     Extended ESBL NEGATIVE Sensitive     * ESCHERICHIA COLI  Culture, blood (x 2)     Status: None  (Preliminary result)   Collection Time: 11/23/16  1:30 AM  Result Value Ref Range Status   Specimen Description BLOOD RIGHT FOREARM  Final   Special Requests BOTTLES DRAWN AEROBIC AND ANAEROBIC 5CC  Final   Culture   Final    NO GROWTH 4 DAYS Performed at [facility], [address] [place], [place] [address]    Report Status PENDING  Incomplete  Blood Culture ID Panel (Reflexed)     Status: Abnormal   Collection Time: 11/23/16  1:30 AM  Result Value Ref Range Status   Enterococcus species NOT DETECTED NOT DETECTED Final   Listeria monocytogenes NOT DETECTED NOT DETECTED Final   Staphylococcus species NOT DETECTED NOT DETECTED Final   Staphylococcus aureus NOT DETECTED NOT DETECTED Final   Streptococcus species NOT DETECTED NOT DETECTED Final   Streptococcus agalactiae NOT DETECTED NOT DETECTED Final   Streptococcus pneumoniae NOT DETECTED NOT DETECTED Final   Streptococcus pyogenes NOT DETECTED NOT DETECTED Final   Acinetobacter baumannii NOT DETECTED NOT DETECTED Final   Enterobacteriaceae species DETECTED (A) NOT DETECTED Final  Comment: Enterobacteriaceae represent a large family of gram-negative bacteria, not a single organism. CRITICAL RESULT CALLED TO, READ BACK BY AND VERIFIED WITH: Lavell Luster, PHARM AT [facility] 11/23/16 AT 2125 BY J FUDESCO    Enterobacter cloacae complex NOT DETECTED NOT DETECTED Final   Escherichia coli DETECTED (A) NOT DETECTED Final    Comment: CRITICAL RESULT CALLED TO, READ BACK BY AND VERIFIED WITH: Lavell Luster, PHARM AT [facility] 11/23/16 AT 2125 BY J FUDESCO    Klebsiella oxytoca NOT DETECTED NOT DETECTED Final   Klebsiella pneumoniae NOT DETECTED NOT DETECTED Final   Proteus species NOT DETECTED NOT DETECTED Final   Serratia marcescens NOT DETECTED NOT DETECTED Final   Carbapenem resistance NOT DETECTED NOT DETECTED Final   Haemophilus influenzae NOT DETECTED NOT DETECTED Final   Neisseria meningitidis NOT DETECTED NOT DETECTED Final    Pseudomonas aeruginosa NOT DETECTED NOT DETECTED Final   Candida albicans NOT DETECTED NOT DETECTED Final   Candida glabrata NOT DETECTED NOT DETECTED Final   Candida krusei NOT DETECTED NOT DETECTED Final   Candida parapsilosis NOT DETECTED NOT DETECTED Final   Candida tropicalis NOT DETECTED NOT DETECTED Final    Comment: Performed at [facility], [place] [address] [place], [place] [address]     Labs: Basic Metabolic Panel:  Recent Labs Lab 11/23/16 0130 11/24/16 0522 11/25/16 0510 11/26/16 0445 11/27/16 0612  NA 138 136 134* 137 135  K 4.4 3.3* 2.7* 3.3* 4.3  CL 107 105 100* 106 105  CO2 25 22 25 24 25   GLUCOSE 137* 128* 114* 97 117*  BUN 19 15 11 13 11   CREATININE 1.43* 1.29* 1.24 1.37* 1.35*  CALCIUM 8.4* 8.4* 8.3* 8.5* 8.9   Liver Function Tests:  Recent Labs Lab 11/23/16 0130 11/24/16 0522 11/25/16 0510 11/26/16 0445 11/27/16 0612  AST 828* 528* 281* 320* 138*  ALT 301* 270* 216* 217* 166*  ALKPHOS 321* 293* 299* 407* 363*  BILITOT 3.9* 1.6* 1.7* 1.9* 0.9  PROT 6.8 6.2* 6.0* 5.8* 6.2*  ALBUMIN 3.6 3.3* 3.1* 3.0* 3.3*    Recent Labs Lab 11/22/16 1915  LIPASE 16    Recent Labs Lab 11/22/16 1915  AMMONIA 35   CBC:  Recent Labs Lab 11/22/16 1915 11/23/16 0130 11/24/16 0522 11/25/16 0510  WBC 12.6* 13.3* 7.0 6.2  NEUTROABS 11.4* 11.4*  --  4.2  HGB 11.5* 10.5* 10.1* 9.9*  HCT 33.1* 31.4* 30.0* 29.2*  MCV 96.2 96.3 96.8 94.2  PLT 124* 121* 102* 101*       Signed:  Shelvia Fojtik S MD.  [facility] 11/27/2016, 12:14 PM

## 2016-11-27 NOTE — Progress Notes (Addendum)
HH orders and F2F sent to [facility] at [phone] to the attn of Della Goo

## 2016-11-28 LAB — CULTURE, BLOOD (ROUTINE X 2): Culture: NO GROWTH

## 2016-11-29 ENCOUNTER — Other Ambulatory Visit: Payer: Self-pay | Admitting: Gastroenterology

## 2016-12-02 DIAGNOSIS — R269 Unspecified abnormalities of gait and mobility: Secondary | ICD-10-CM | POA: Diagnosis not present

## 2016-12-02 DIAGNOSIS — M6281 Muscle weakness (generalized): Secondary | ICD-10-CM | POA: Diagnosis not present

## 2016-12-05 DIAGNOSIS — K838 Other specified diseases of biliary tract: Secondary | ICD-10-CM | POA: Diagnosis not present

## 2016-12-05 DIAGNOSIS — K869 Disease of pancreas, unspecified: Secondary | ICD-10-CM | POA: Diagnosis not present

## 2016-12-06 ENCOUNTER — Inpatient Hospital Stay (HOSPITAL_COMMUNITY)
Admission: AD | Admit: 2016-12-06 | Discharge: 2016-12-13 | DRG: 444 | Disposition: A | Discharge status: Home Health | Payer: Medicare Other | Source: Ambulatory Visit | Attending: Internal Medicine | Admitting: Internal Medicine

## 2016-12-06 ENCOUNTER — Encounter (HOSPITAL_COMMUNITY): Payer: Self-pay | Admitting: *Deleted

## 2016-12-06 DIAGNOSIS — X58XXXA Exposure to other specified factors, initial encounter: Secondary | ICD-10-CM | POA: Diagnosis present

## 2016-12-06 DIAGNOSIS — D490 Neoplasm of unspecified behavior of digestive system: Secondary | ICD-10-CM | POA: Diagnosis not present

## 2016-12-06 DIAGNOSIS — E43 Unspecified severe protein-calorie malnutrition: Secondary | ICD-10-CM | POA: Insufficient documentation

## 2016-12-06 DIAGNOSIS — K8050 Calculus of bile duct without cholangitis or cholecystitis without obstruction: Secondary | ICD-10-CM | POA: Diagnosis not present

## 2016-12-06 DIAGNOSIS — F329 Major depressive disorder, single episode, unspecified: Secondary | ICD-10-CM | POA: Diagnosis not present

## 2016-12-06 DIAGNOSIS — K830 Cholangitis: Secondary | ICD-10-CM | POA: Diagnosis not present

## 2016-12-06 DIAGNOSIS — K869 Disease of pancreas, unspecified: Secondary | ICD-10-CM | POA: Diagnosis present

## 2016-12-06 DIAGNOSIS — Z79899 Other long term (current) drug therapy: Secondary | ICD-10-CM

## 2016-12-06 DIAGNOSIS — A419 Sepsis, unspecified organism: Secondary | ICD-10-CM | POA: Diagnosis not present

## 2016-12-06 DIAGNOSIS — N179 Acute kidney failure, unspecified: Secondary | ICD-10-CM | POA: Diagnosis not present

## 2016-12-06 DIAGNOSIS — Z880 Allergy status to penicillin: Secondary | ICD-10-CM

## 2016-12-06 DIAGNOSIS — R740 Nonspecific elevation of levels of transaminase and lactic acid dehydrogenase [LDH]: Secondary | ICD-10-CM | POA: Diagnosis present

## 2016-12-06 DIAGNOSIS — E871 Hypo-osmolality and hyponatremia: Secondary | ICD-10-CM | POA: Diagnosis present

## 2016-12-06 DIAGNOSIS — R17 Unspecified jaundice: Secondary | ICD-10-CM | POA: Diagnosis present

## 2016-12-06 DIAGNOSIS — K8030 Calculus of bile duct with cholangitis, unspecified, without obstruction: Secondary | ICD-10-CM | POA: Diagnosis not present

## 2016-12-06 DIAGNOSIS — N183 Chronic kidney disease, stage 3 (moderate): Secondary | ICD-10-CM | POA: Diagnosis present

## 2016-12-06 DIAGNOSIS — Z885 Allergy status to narcotic agent status: Secondary | ICD-10-CM | POA: Diagnosis not present

## 2016-12-06 DIAGNOSIS — K831 Obstruction of bile duct: Secondary | ICD-10-CM

## 2016-12-06 DIAGNOSIS — K8031 Calculus of bile duct with cholangitis, unspecified, with obstruction: Principal | ICD-10-CM | POA: Diagnosis present

## 2016-12-06 DIAGNOSIS — Z87891 Personal history of nicotine dependence: Secondary | ICD-10-CM | POA: Diagnosis not present

## 2016-12-06 DIAGNOSIS — D638 Anemia in other chronic diseases classified elsewhere: Secondary | ICD-10-CM | POA: Diagnosis present

## 2016-12-06 DIAGNOSIS — E876 Hypokalemia: Secondary | ICD-10-CM | POA: Diagnosis present

## 2016-12-06 DIAGNOSIS — L8990 Pressure ulcer of unspecified site, unspecified stage: Secondary | ICD-10-CM | POA: Diagnosis not present

## 2016-12-06 DIAGNOSIS — K219 Gastro-esophageal reflux disease without esophagitis: Secondary | ICD-10-CM | POA: Diagnosis present

## 2016-12-06 DIAGNOSIS — R5381 Other malaise: Secondary | ICD-10-CM | POA: Diagnosis present

## 2016-12-06 DIAGNOSIS — R8589 Other abnormal findings in specimens from digestive organs and abdominal cavity: Secondary | ICD-10-CM | POA: Diagnosis not present

## 2016-12-06 DIAGNOSIS — E785 Hyperlipidemia, unspecified: Secondary | ICD-10-CM | POA: Diagnosis not present

## 2016-12-06 DIAGNOSIS — S3091XA Unspecified superficial injury of lower back and pelvis, initial encounter: Secondary | ICD-10-CM | POA: Diagnosis present

## 2016-12-06 DIAGNOSIS — I129 Hypertensive chronic kidney disease with stage 1 through stage 4 chronic kidney disease, or unspecified chronic kidney disease: Secondary | ICD-10-CM | POA: Diagnosis present

## 2016-12-06 DIAGNOSIS — Z4659 Encounter for fitting and adjustment of other gastrointestinal appliance and device: Secondary | ICD-10-CM | POA: Diagnosis not present

## 2016-12-06 DIAGNOSIS — Z9689 Presence of other specified functional implants: Secondary | ICD-10-CM | POA: Diagnosis present

## 2016-12-06 DIAGNOSIS — R945 Abnormal results of liver function studies: Secondary | ICD-10-CM | POA: Diagnosis not present

## 2016-12-06 DIAGNOSIS — D649 Anemia, unspecified: Secondary | ICD-10-CM | POA: Diagnosis not present

## 2016-12-06 LAB — COMPREHENSIVE METABOLIC PANEL
ALK PHOS: 658 U/L — AB (ref 38–126)
ALT: 132 U/L — ABNORMAL HIGH (ref 17–63)
ANION GAP: 14 (ref 5–15)
AST: 229 U/L — ABNORMAL HIGH (ref 15–41)
Albumin: 2.7 g/dL — ABNORMAL LOW (ref 3.5–5.0)
BILIRUBIN TOTAL: 19.1 mg/dL — AB (ref 0.3–1.2)
BUN: 15 mg/dL (ref 6–20)
CALCIUM: 8.7 mg/dL — AB (ref 8.9–10.3)
CO2: 24 mmol/L (ref 22–32)
Chloride: 96 mmol/L — ABNORMAL LOW (ref 101–111)
Creatinine, Ser: 1.95 mg/dL — ABNORMAL HIGH (ref 0.61–1.24)
GFR calc Af Amer: 34 mL/min — ABNORMAL LOW (ref >60)
GFR calc non Af Amer: 30 mL/min — ABNORMAL LOW (ref >60)
GLUCOSE: 102 mg/dL — AB (ref 65–99)
Potassium: 3.1 mmol/L — ABNORMAL LOW (ref 3.5–5.1)
Sodium: 134 mmol/L — ABNORMAL LOW (ref 135–145)
TOTAL PROTEIN: 6 g/dL — AB (ref 6.5–8.1)

## 2016-12-06 LAB — CBC
HCT: 29.2 % — ABNORMAL LOW (ref 39.0–52.0)
Hemoglobin: 9.8 g/dL — ABNORMAL LOW (ref 13.0–17.0)
MCH: 31.7 pg (ref 26.0–34.0)
MCHC: 33.6 g/dL (ref 30.0–36.0)
MCV: 94.5 fL (ref 78.0–100.0)
Platelets: 342 10*3/uL (ref 150–400)
RBC: 3.09 MIL/uL — ABNORMAL LOW (ref 4.22–5.81)
RDW: 13.8 % (ref 11.5–15.5)
WBC: 12.5 10*3/uL — ABNORMAL HIGH (ref 4.0–10.5)

## 2016-12-06 LAB — PHOSPHORUS: Phosphorus: 3.4 mg/dL (ref 2.5–4.6)

## 2016-12-06 LAB — MAGNESIUM: Magnesium: 1.7 mg/dL (ref 1.7–2.4)

## 2016-12-06 MED ORDER — ENOXAPARIN SODIUM 30 MG/0.3ML ~~LOC~~ SOLN
30.0000 mg | SUBCUTANEOUS | Status: DC
  Administered 2016-12-06 – 2016-12-07 (×2): 30 mg via SUBCUTANEOUS
  Filled 2016-12-06 (×2): qty 0.3

## 2016-12-06 MED ORDER — SODIUM CHLORIDE 0.9% FLUSH
3.0000 mL | Freq: Two times a day (BID) | INTRAVENOUS | Status: DC
  Administered 2016-12-07 – 2016-12-13 (×9): 3 mL via INTRAVENOUS

## 2016-12-06 MED ORDER — CEFPODOXIME PROXETIL 200 MG PO TABS: 200.0000 mg | ORAL_TABLET | Freq: Two times a day (BID) | ORAL | Status: DC

## 2016-12-06 MED ORDER — ENSURE ENLIVE PO LIQD
237.0000 mL | Freq: Two times a day (BID) | ORAL | Status: DC
  Administered 2016-12-07 – 2016-12-13 (×9): 237 mL via ORAL

## 2016-12-06 MED ORDER — FOLIC ACID 1 MG PO TABS
1.0000 mg | ORAL_TABLET | Freq: Every day | ORAL | Status: DC
  Administered 2016-12-07 – 2016-12-13 (×7): 1 mg via ORAL
  Filled 2016-12-06 (×7): qty 1

## 2016-12-06 MED ORDER — OCUVITE-LUTEIN PO CAPS
1.0000 | ORAL_CAPSULE | Freq: Every day | ORAL | Status: DC
  Filled 2016-12-06: qty 1

## 2016-12-06 MED ORDER — MIRABEGRON ER 50 MG PO TB24
50.0000 mg | ORAL_TABLET | Freq: Every day | ORAL | Status: DC
  Administered 2016-12-07 – 2016-12-13 (×7): 50 mg via ORAL
  Filled 2016-12-06 (×7): qty 1

## 2016-12-06 MED ORDER — CALCIUM CARBONATE 1250 (500 CA) MG PO TABS
500.0000 mg | ORAL_TABLET | Freq: Every day | ORAL | Status: DC
  Administered 2016-12-07 – 2016-12-13 (×7): 500 mg via ORAL
  Filled 2016-12-06 (×7): qty 1

## 2016-12-06 MED ORDER — ADULT MULTIVITAMIN W/MINERALS CH
1.0000 | ORAL_TABLET | Freq: Every day | ORAL | Status: DC
  Administered 2016-12-07 – 2016-12-13 (×7): 1 via ORAL
  Filled 2016-12-06 (×7): qty 1

## 2016-12-06 MED ORDER — METRONIDAZOLE 500 MG PO TABS
500.0000 mg | ORAL_TABLET | Freq: Three times a day (TID) | ORAL | Status: DC
  Administered 2016-12-06 – 2016-12-07 (×2): 500 mg via ORAL
  Filled 2016-12-06 (×2): qty 1

## 2016-12-06 MED ORDER — PANTOPRAZOLE SODIUM 40 MG PO TBEC
40.0000 mg | DELAYED_RELEASE_TABLET | Freq: Every day | ORAL | Status: DC
  Administered 2016-12-07 – 2016-12-13 (×7): 40 mg via ORAL
  Filled 2016-12-06 (×7): qty 1

## 2016-12-06 MED ORDER — ACETAMINOPHEN 500 MG PO TABS: 1000.0000 mg | ORAL_TABLET | Freq: Four times a day (QID) | ORAL | Status: DC | PRN

## 2016-12-06 MED ORDER — CIPROFLOXACIN HCL 500 MG PO TABS
250.0000 mg | ORAL_TABLET | Freq: Two times a day (BID) | ORAL | Status: DC
  Administered 2016-12-06 – 2016-12-07 (×2): 250 mg via ORAL
  Filled 2016-12-06 (×2): qty 1

## 2016-12-06 MED ORDER — POLYVINYL ALCOHOL 1.4 % OP SOLN
1.0000 [drp] | Freq: Two times a day (BID) | OPHTHALMIC | Status: DC
  Administered 2016-12-07 – 2016-12-13 (×12): 1 [drp] via OPHTHALMIC
  Filled 2016-12-06: qty 15

## 2016-12-06 MED ORDER — VITAMIN B-1 100 MG PO TABS
100.0000 mg | ORAL_TABLET | Freq: Every day | ORAL | Status: DC
  Administered 2016-12-07 – 2016-12-13 (×7): 100 mg via ORAL
  Filled 2016-12-06 (×7): qty 1

## 2016-12-06 MED ORDER — ONDANSETRON HCL 4 MG/2ML IJ SOLN: 4.0000 mg | Freq: Four times a day (QID) | INTRAMUSCULAR | Status: DC | PRN

## 2016-12-06 MED ORDER — ONDANSETRON HCL 4 MG PO TABS: 4.0000 mg | ORAL_TABLET | Freq: Four times a day (QID) | ORAL | Status: DC | PRN

## 2016-12-06 MED ORDER — SODIUM CHLORIDE 0.9 % IV SOLN
INTRAVENOUS | Status: DC
  Administered 2016-12-06 – 2016-12-07 (×2): via INTRAVENOUS

## 2016-12-06 MED ORDER — TRAMADOL HCL 50 MG PO TABS
50.0000 mg | ORAL_TABLET | Freq: Four times a day (QID) | ORAL | Status: DC | PRN
  Administered 2016-12-10: 50 mg via ORAL
  Filled 2016-12-06: qty 1

## 2016-12-06 NOTE — H&P (Signed)
History and Physical    [name] [name] [id number] DOB: 11-13-1928 DOA: 12/06/2016  Referring MD/NP/PA: Dr. Paulita Fujita  PCP: Limmie Patricia, MD   Patient coming from: home  Chief Complaint: jaundice   HPI: 83 y.o.gentleman with a history of HTN, HLD, CKD 3, chronic anemia, GERD, and biliary obstruction requiring CBD stent in November 2016 recently hospitalized for sepsis, E. Coli bacteremia, prostate cancer, on last admission was found to have transaminitis and hyperbilirubinemia and these enzymes trended down during the hospital stay and pt was discharge with intent to follow up with Dr. Paulita Fujita. Dr. Paulita Fujita requested now direct admission as blood work was notable for rise in Cr to 2.03 (from 1.3 on recent discharge), bilirubin also noted to be 19 (was normal about 10 days ago). Pt reports noticing progressively worsening jaundice in the past week and occasional epigastric pain, dull and intermittent with no specific alleviating or aggravating factors, occasionally associated with nausea and per daughter and wife at bedside also some confusion. Dr. Paulita Fujita plans to perform ERCP on Monday. Pt currently denies chest pain or dyspnea, no fevers, chills, and currently no abd pain.   ED Course: Pt presented from home   Review of Systems:  Constitutional: Negative for fever, chills, diaphoresis, and fatigue.  HENT: Negative for ear pain, nosebleeds, congestion, facial swelling, rhinorrhea, neck pain, neck stiffness and ear discharge.   Eyes: Negative for pain, discharge, redness,  visual disturbance.  Respiratory: Negative for cough, choking, chest tightness, shortness of breath, wheezing and stridor.   Cardiovascular: Negative for chest pain, palpitations Gastrointestinal: Negative for abdominal distention.  Genitourinary: Negative for dysuria, urgency, frequency, hematuria, flank pain Musculoskeletal: Negative for back pain, joint swelling, arthralgias and gait problem.  Neurological:  Negative for dizziness, tremors, seizures, syncope, facial asymmetry, speech difficulty Hematological: Negative for adenopathy. Does not bruise/bleed easily.  Psychiatric/Behavioral: Negative for hallucinations, behavioral problems, confusion, dysphoric mood, decreased concentration and agitation.   Past Medical History:  Diagnosis Date  . Afebrile   . Anemia   . Chronic renal insufficiency   . Colitis   . Dyslipidemia   . Dysphagia   . ED (erectile dysfunction)   . Edema of extremities   . Hiatus hernia syndrome   . Hypertension    systolic  . Inguinal hernia   . Microhematuria   . Onychomycosis   . Osteoporosis   . Pleural effusion   . Prostate cancer ([place])   . Shingles   . Syncope 2012    Past Surgical History:  Procedure Laterality Date  . ERCP N/A 09/06/2015   Procedure: ENDOSCOPIC RETROGRADE CHOLANGIOPANCREATOGRAPHY (ERCP);  Surgeon: Arta Silence, MD;  Location: [facility];  Service: Endoscopy;  Laterality: N/A;  . EUS Left 09/06/2015   Procedure: UPPER ENDOSCOPIC ULTRASOUND (EUS) LINEAR;  Surgeon: Arta Silence, MD;  Location: [facility];  Service: Endoscopy;  Laterality: Left;  . TRANSPERINEAL IMPLANT OF RADIATION SEEDS W/ ULTRASOUND  2001   Social Hx:  reports that he quit smoking about 64 years ago. He has never used smokeless tobacco. He reports that he drinks about 8.4 oz of alcohol per week . He reports that he does not use drugs.  Allergies  Allergen Reactions  . Penicillins Hives and Other (See Comments)    Has patient had a PCN reaction causing immediate rash, facial/tongue/throat swelling, SOB or lightheadedness with hypotension: No Has patient had a PCN reaction causing severe rash involving mucus membranes or skin necrosis: No Has patient had a PCN reaction that required hospitalization No  Has patient had a PCN reaction occurring within the last 10 years: No If all of the above answers are "NO", then may proceed with Cephalosporin use.  Marland Kitchen  Oxycontin [Oxycodone Hcl] Other (See Comments)    Reaction:  Altered mental status     Family History  Problem Relation Age of Onset  . Throat cancer Mother   . Lung cancer Brother   . Bone cancer Brother     Prior to Admission medications   Medication Sig Start Date End Date Taking? Authorizing Provider  acetaminophen (TYLENOL) 500 MG tablet Take 1,000 mg by mouth every 6 (six) hours as needed for mild pain, moderate pain or headache.     Historical Provider, MD  calcium carbonate (OSCAL) 1500 (600 Ca) MG TABS tablet Take 600 mg of elemental calcium by mouth daily with breakfast.    Historical Provider, MD  cefpodoxime (VANTIN) 200 MG tablet Take 1 tablet (200 mg total) by mouth every 12 (twelve) hours. 11/27/16   Oswald Hillock, MD  folic acid (FOLVITE) 1 MG tablet Take 1 tablet (1 mg total) by mouth daily. 11/28/16   Oswald Hillock, MD  metroNIDAZOLE (FLAGYL) 500 MG tablet Take 1 tablet (500 mg total) by mouth every 8 (eight) hours. 11/27/16   Oswald Hillock, MD  Multiple Vitamin (MULTIVITAMIN WITH MINERALS) TABS tablet Take 1 tablet by mouth daily.    Historical Provider, MD  Multiple Vitamins-Minerals (PRESERVISION AREDS 2+MULTI VIT) CAPS Take 1 capsule by mouth daily.    Historical Provider, MD  MYRBETRIQ 50 MG TB24 tablet Take 50 mg by mouth daily.    Historical Provider, MD  omeprazole (PRILOSEC) 40 MG capsule Take 40 mg by mouth daily.    Historical Provider, MD  Polyethyl Glycol-Propyl Glycol (SYSTANE) 0.4-0.3 % SOLN Place 1 drop into both eyes 2 (two) times daily.    Historical Provider, MD  thiamine 100 MG tablet Take 1 tablet (100 mg total) by mouth daily. 11/28/16   Oswald Hillock, MD    Physical Exam: Vitals:   12/06/16 1708 12/06/16 1708  BP:  130/64  Pulse:  99  Resp:  16  Temp:  98.2 F (36.8 C)  TempSrc:  Oral  SpO2:  100%  Weight: 70.3 kg (155 lb)   Height: 5\' 9"  (1.753 m)     Constitutional: NAD, calm, comfortable Vitals:   12/06/16 1708 12/06/16 1708  BP:  130/64    Pulse:  99  Resp:  16  Temp:  98.2 F (36.8 C)  TempSrc:  Oral  SpO2:  100%  Weight: 70.3 kg (155 lb)   Height: 5\' 9"  (1.753 m)    Eyes: PERRL, jaundiced sclera  ENMT: Mucous membranes are somewhat dry. Posterior pharynx clear of any exudate or lesions.Normal dentition.  Neck: normal, supple, no masses, no thyromegaly Respiratory:  Normal respiratory effort. No accessory muscle use.  Cardiovascular: Regular rhythm, occasional ectopic beats noted, no rubs / gallops.2+ pedal pulses. No carotid bruits.  Abdomen: mild tenderness in epigastric area tenderness, no guarding  Musculoskeletal: no clubbing / cyanosis. Good ROM, no contractures. Normal muscle tone.  Skin: jaundiced skin  Neurologic: CN 2-12 grossly intact. Sensation intact, DTR normal. Strength 5/5 in all 4.  Psychiatric: Normal judgment and insight. Alert and oriented x 3. Normal mood.   Labs on Admission: I have personally reviewed following labs and imaging studies  Urine analysis:    Component Value Date/Time   COLORURINE YELLOW 11/22/2016 2052   APPEARANCEUR CLEAR 11/22/2016 2052  LABSPEC 1.013 11/22/2016 2052   PHURINE 5.0 11/22/2016 2052   GLUCOSEU NEGATIVE 11/22/2016 2052   HGBUR LARGE (A) 11/22/2016 2052   [place] NEGATIVE 11/22/2016 2052   [place] 11/22/2016 2052   PROTEINUR 30 (A) 11/22/2016 2052   NITRITE NEGATIVE 11/22/2016 2052   LEUKOCYTESUR NEGATIVE 11/22/2016 2052   Radiological Exams on Admission: No results found.  EKG: not done   Assessment/Plan Active Problems:   Jaundice, transaminitis  - in the setting of known pancreatic mass 11/22/2016, 2.6 x 3.4 cm in size - also on the same CT scan, interval development of diffuse dilatation of the main pancreatic duct - pt already with hx of stent placed - will repeat CMET again today and again in am - start IVF - plan for ERCP on Monday    AKI imposed on CKD stage III  - reported Cr 2.03 - was about 1.3 on last discharge - will  place on IVF and repeat CMET in AM  Repeat Blood work pending at this time   DVT prophylaxis: Jaundice  Code Status: Full  Family Communication: Pt, wife and daughter updated at bedside Disposition Plan: home once medically stable  Consults called: GI Admission status: Inpatient    Faye Ramsay MD [facility] Pager [phone]  If 7PM-7AM, please contact night-coverage [email or web address] Password [facility]  12/06/2016, 6:12 PM

## 2016-12-06 NOTE — Progress Notes (Signed)
Paged Dr Aggie Moats to make ware patient has arrived to floor.

## 2016-12-07 ENCOUNTER — Other Ambulatory Visit (HOSPITAL_COMMUNITY): Payer: Self-pay

## 2016-12-07 ENCOUNTER — Inpatient Hospital Stay (HOSPITAL_COMMUNITY): Payer: Medicare Other

## 2016-12-07 ENCOUNTER — Encounter (HOSPITAL_COMMUNITY): Payer: Self-pay | Admitting: *Deleted

## 2016-12-07 DIAGNOSIS — E43 Unspecified severe protein-calorie malnutrition: Secondary | ICD-10-CM | POA: Insufficient documentation

## 2016-12-07 DIAGNOSIS — L8990 Pressure ulcer of unspecified site, unspecified stage: Secondary | ICD-10-CM | POA: Insufficient documentation

## 2016-12-07 LAB — COMPREHENSIVE METABOLIC PANEL
ALBUMIN: 2.2 g/dL — AB (ref 3.5–5.0)
ALK PHOS: 698 U/L — AB (ref 38–126)
ALT: 119 U/L — ABNORMAL HIGH (ref 17–63)
ANION GAP: 11 (ref 5–15)
AST: 209 U/L — ABNORMAL HIGH (ref 15–41)
BUN: 14 mg/dL (ref 6–20)
CALCIUM: 8.2 mg/dL — AB (ref 8.9–10.3)
CHLORIDE: 100 mmol/L — AB (ref 101–111)
CO2: 23 mmol/L (ref 22–32)
Creatinine, Ser: 1.68 mg/dL — ABNORMAL HIGH (ref 0.61–1.24)
GFR calc Af Amer: 41 mL/min — ABNORMAL LOW (ref >60)
GFR calc non Af Amer: 35 mL/min — ABNORMAL LOW (ref >60)
GLUCOSE: 95 mg/dL (ref 65–99)
Potassium: 2.8 mmol/L — ABNORMAL LOW (ref 3.5–5.1)
Sodium: 134 mmol/L — ABNORMAL LOW (ref 135–145)
Total Bilirubin: 19.1 mg/dL (ref 0.3–1.2)
Total Protein: 5.4 g/dL — ABNORMAL LOW (ref 6.5–8.1)

## 2016-12-07 LAB — CBC
HCT: 26.2 % — ABNORMAL LOW (ref 39.0–52.0)
HEMOGLOBIN: 8.9 g/dL — AB (ref 13.0–17.0)
MCH: 31.9 pg (ref 26.0–34.0)
MCHC: 34 g/dL (ref 30.0–36.0)
MCV: 93.9 fL (ref 78.0–100.0)
Platelets: 326 10*3/uL (ref 150–400)
RBC: 2.79 MIL/uL — AB (ref 4.22–5.81)
RDW: 13.9 % (ref 11.5–15.5)
WBC: 10.6 10*3/uL — ABNORMAL HIGH (ref 4.0–10.5)

## 2016-12-07 LAB — POTASSIUM: Potassium: 3.6 mmol/L (ref 3.5–5.1)

## 2016-12-07 LAB — LIPASE, BLOOD: Lipase: 11 U/L (ref 11–51)

## 2016-12-07 MED ORDER — POTASSIUM CHLORIDE CRYS ER 20 MEQ PO TBCR
40.0000 meq | EXTENDED_RELEASE_TABLET | Freq: Once | ORAL | Status: AC
  Administered 2016-12-07: 40 meq via ORAL
  Filled 2016-12-07: qty 2

## 2016-12-07 MED ORDER — CIPROFLOXACIN IN D5W 200 MG/100ML IV SOLN
200.0000 mg | Freq: Two times a day (BID) | INTRAVENOUS | Status: DC
  Administered 2016-12-07 – 2016-12-10 (×6): 200 mg via INTRAVENOUS
  Filled 2016-12-07 (×7): qty 100

## 2016-12-07 MED ORDER — METRONIDAZOLE IN NACL 5-0.79 MG/ML-% IV SOLN
500.0000 mg | Freq: Two times a day (BID) | INTRAVENOUS | Status: DC
  Administered 2016-12-07 – 2016-12-11 (×8): 500 mg via INTRAVENOUS
  Filled 2016-12-07 (×8): qty 100

## 2016-12-07 MED ORDER — PROSIGHT PO TABS
1.0000 | ORAL_TABLET | Freq: Every day | ORAL | Status: DC
  Administered 2016-12-07 – 2016-12-13 (×7): 1 via ORAL
  Filled 2016-12-07 (×7): qty 1

## 2016-12-07 NOTE — Progress Notes (Signed)
Patient ID: [name] [name], male   DOB: 04-16-1929, 83 y.o.   MRN: [id number]    PROGRESS NOTE    [name] [name]  [name] DOB: 1928/11/06 DOA: 12/06/2016  PCP: Limmie Patricia, MD   Brief Narrative:  83 y.o.gentleman with a history of HTN, HLD, CKD 3, chronic anemia, GERD, and biliary obstruction requiring CBD stent in November 2016 recently hospitalized for sepsis, E. Coli bacteremia, prostate cancer, on last admission was found to have transaminitis and hyperbilirubinemia and these enzymes trended down during the hospital stay and pt was discharge with intent to follow up with Dr. Paulita Fujita. Dr. Paulita Fujita requested now direct admission as blood work was notable for rise in Cr to 2.03 (from 1.3 on recent discharge), bilirubin also noted to be 19 (was normal about 10 days ago). Pt reports noticing progressively worsening jaundice in the past week and occasional epigastric pain, dull and intermittent with no specific alleviating or aggravating factors, occasionally associated with nausea and per daughter and wife at bedside also some confusion. Dr. Paulita Fujita plans to perform ERCP on Monday. Pt currently denies chest pain or dyspnea, no fevers, chills, and currently no abd pain.   Assessment & Plan:     Jaundice, transaminitis  - in the setting of known pancreatic mass 11/22/2016, 2.6 x 3.4 cm in size - also on the same CT scan, interval development of diffuse dilatation of the main pancreatic duct - pt already with hx of stent placed - bilirubin still 19, abd [place] requested  - continue Cipro and Flagyl day #2 per GI team  - CMET in AM    AKI imposed on CKD stage III  - reported Cr 2.03 prior to this admission  - appears to be pre renal - IVF and Cr is trending down     Anemia of chronic disease - ? Malignancy related - no signs of active bleeding  - CBC in AM    Hyponatremia - also pre renal - continue to monitor    Hypokalemia - check Mg level - supplement K and repeat BMP in  AM  DVT prophylaxis: Lovenox SQ Code Status: Full  Family Communication: Patient and family at bedside  Disposition Plan: Home once GI team clears   Consultants:   GI  Procedures:   None  Antimicrobials:   Cipro 3/2 -->  Flagyl 3/2 -->  Subjective: No events overnight.   Objective: Vitals:   12/06/16 1708 12/06/16 2112 12/07/16 0610 12/07/16 1243  BP: 130/64 116/60 (!) 117/52 108/78  Pulse: 99 97 90 (!) 103  Resp: 16  18 18   Temp: 98.2 F (36.8 C) 98.6 F (37 C) 98.3 F (36.8 C) 98.5 F (36.9 C)  TempSrc: Oral Oral Oral Oral  SpO2: 100% 97% 99% 98%  Weight:      Height:        Intake/Output Summary (Last 24 hours) at 12/07/16 1350 Last data filed at 12/07/16 [id number]  Gross per 24 hour  Intake                0 ml  Output              475 ml  Net             -475 ml   Filed Weights   12/06/16 1708  Weight: 70.3 kg (155 lb)    Examination:  General exam: Appears calm and comfortable, jaundiced  Respiratory system: Clear to auscultation. Respiratory effort normal. Cardiovascular system: No JVD, murmurs,  rubs, gallops or clicks. No pedal edema. Gastrointestinal system: No organomegaly or masses felt. Normal bowel sounds heard. Central nervous system: Alert and oriented. No focal neurological deficits.  Data Reviewed: I have personally reviewed following labs and imaging studies  CBC:  Recent Labs Lab 12/06/16 1900 12/07/16 0436  WBC 12.5* 10.6*  HGB 9.8* 8.9*  HCT 29.2* 26.2*  MCV 94.5 93.9  PLT 342 [id number]   Basic Metabolic Panel:  Recent Labs Lab 12/06/16 1900 12/07/16 0436  NA 134* 134*  K 3.1* 2.8*  CL 96* 100*  CO2 24 23  GLUCOSE 102* 95  BUN 15 14  CREATININE 1.95* 1.68*  CALCIUM 8.7* 8.2*  MG 1.7  --   PHOS 3.4  --    Liver Function Tests:  Recent Labs Lab 12/06/16 1900 12/07/16 0436  AST 229* 209*  ALT 132* 119*  ALKPHOS 658* 698*  BILITOT 19.1* 19.1*  PROT 6.0* 5.4*  ALBUMIN 2.7* 2.2*    Recent Labs Lab  12/07/16 0436  LIPASE 11   Urine analysis:    Component Value Date/Time   COLORURINE YELLOW 11/22/2016 2052   APPEARANCEUR CLEAR 11/22/2016 2052   LABSPEC 1.013 11/22/2016 2052   PHURINE 5.0 11/22/2016 2052   GLUCOSEU NEGATIVE 11/22/2016 2052   HGBUR LARGE (A) 11/22/2016 2052   BILIRUBINUR NEGATIVE 11/22/2016 2052   [place] NEGATIVE 11/22/2016 2052   PROTEINUR 30 (A) 11/22/2016 2052   NITRITE NEGATIVE 11/22/2016 2052   LEUKOCYTESUR NEGATIVE 11/22/2016 2052   Radiology Studies: No results found.  Scheduled Meds: . calcium carbonate  500 mg of elemental calcium Oral Q breakfast  . ciprofloxacin  250 mg Oral BID  . enoxaparin (LOVENOX) injection  30 mg Subcutaneous Q24H  . feeding supplement (ENSURE ENLIVE)  237 mL Oral BID BM  . folic acid  1 mg Oral Daily  . metroNIDAZOLE  500 mg Oral Q8H  . mirabegron ER  50 mg Oral Daily  . multivitamin  1 tablet Oral Daily  . multivitamin with minerals  1 tablet Oral Daily  . pantoprazole  40 mg Oral Daily  . polyvinyl alcohol  1 drop Both Eyes BID  . sodium chloride flush  3 mL Intravenous Q12H  . thiamine  100 mg Oral Daily   Continuous Infusions: . sodium chloride 75 mL/hr at 12/07/16 0958     LOS: 1 day    Time spent: 20 minutes    Faye Ramsay, MD [facility] Pager [phone]  If 7PM-7AM, please contact night-coverage [email or web address] Password [email or web address] 12/07/2016, 1:50 PM

## 2016-12-07 NOTE — Care Management Note (Addendum)
Case Management Note  Patient Details  Name: [name] [name] MRN: [id number] Date of Birth: 10-03-1929  Subjective/Objective:  Per previous Hospital discharge pt is active with [facility].Della Goo tel#[phone]- Will need HH orders and F2F to resume at discharge.                   Action/Plan:CM will follow closely for disposition/discharge needs.    Expected Discharge Date:                  Expected Discharge Plan:     In-House Referral:     Discharge planning Services     Post Acute Care Choice:    Choice offered to:     DME Arranged:    DME Agency:     HH Arranged:    HH Agency:     Status of Service:     If discussed at [employer], dates discussed:    Additional Comments:  Delrae Sawyers, RN 12/07/2016, 4:21 PM

## 2016-12-07 NOTE — Progress Notes (Signed)
Initial Nutrition Assessment  DOCUMENTATION CODES:  Not applicable   Pt meets criteria for SEVERE MALNUTRITION in the context of Acute Illness as evidenced by loss of >5% bw in <1 month and an oral intake of </= 50% of needs for >/= 5 days.  INTERVENTION:  Once diet is advanced, Ensure Enlive po BID, each supplement provides 350 kcal and 20 grams of protein  -should sip slowly and monitor for tolerance  NUTRITION DIAGNOSIS:  Inadequate oral intake related to poor appetite as evidenced by loss of >5% bw in last of 3 weeks  GOAL:  Patient will meet greater than or equal to 90% of their needs  MONITOR:  PO intake, Supplement acceptance, Diet advancement, Labs  REASON FOR ASSESSMENT:  Malnutrition Screening Tool    ASSESSMENT:  83 y/o male PMHx HTN, HLD, CKD3, Chronic anemia, GERD, prostate cancer, pancreatic mass worrisome for malignancy. Presented with worsening jaundice and AKI on CKD 3.  Patient sounds to be slightly confused on discussion. He first reports having a very good appetite and eating well, but when asked what has caused his weight loss, he states he has not had any appetite at all. He says his decreased PO intake has been going on for 3-4 weeks and quantifies this as "half" of his normal.   At baseline, he did not follow any type of diet, nor did he drink any nutritional supplements.   He says his UBW is 180 lbs, though this does not appear to be recent. Per chart review, he was admitted 2/16 at weight of 269 lbs. He has lost a tleast 10 lbs in the last month (>5% bw)  At this time, pt maintains that he has no appetite and would not eat, even if he was not NPO. Denies any other symptoms such as N/V/C/D.   He says he had and liked the Ensure today. Because he tolerated, can try BID once diet is advanced, however tolerance should be monitored as he may have trouble with high fat supplements due to pancreatic mass.  NFPE: Mild muscle/fat wasting. Profoundly jaundiced.    Labs: Hypokalemia, Renal labs improving, AlbuminL 2.2, WBC:10.6, Elevated Liver Enzymes Medications: MVI, IV abx, Folate, Calcium Carb, oral abx, ppi, KCL, Thiamine,    Recent Labs Lab 12/06/16 1900 12/07/16 0436  NA 134* 134*  K 3.1* 2.8*  CL 96* 100*  CO2 24 23  BUN 15 14  CREATININE 1.95* 1.68*  CALCIUM 8.7* 8.2*  MG 1.7  --   PHOS 3.4  --   GLUCOSE 102* 95   Diet Order:  Diet NPO time specified  Skin: Jaundice, abrasion to head  Last BM:  3/3  Height:  Ht Readings from Last 1 Encounters:  12/06/16 5\' 9"  (1.753 m)   Weight:  Wt Readings from Last 1 Encounters:  12/06/16 155 lb (70.3 kg)   Wt Readings from Last 10 Encounters:  12/06/16 155 lb (70.3 kg)  11/23/16 165 lb 5.5 oz (75 kg)  09/06/15 172 lb (78 kg)  09/01/15 172 lb 13.5 oz (78.4 kg)  02/23/14 170 lb (77.1 kg)   Ideal Body Weight:  72.73 kg  BMI:  Body mass index is 22.89 kg/m.  Estimated Nutritional Needs:  Kcal:  1900-2100 kcals (27-30 kcal/kg bw) Protein:  85-100 g Pro (1.2-1.4 g/kg bw) Fluid:  1.9-2.1 L fluid  EDUCATION NEEDS:  Education needs no appropriate at this time  Burtis Junes RD, LDN, [place] [facility] Pager: [phone] 12/07/2016 4:52 PM

## 2016-12-07 NOTE — Progress Notes (Signed)
Subjective: No abdominal pain.  Objective: Vital signs in last 24 hours: Temp:  [98.2 F (36.8 C)-98.6 F (37 C)] 98.5 F (36.9 C) (03/03 1243) Pulse Rate:  [90-103] 103 (03/03 1243) Resp:  [16-18] 18 (03/03 1243) BP: (108-130)/(52-78) 108/78 (03/03 1243) SpO2:  [97 %-100 %] 98 % (03/03 1243) Weight:  [70.3 kg (155 lb)] 70.3 kg (155 lb) (03/02 1708) Weight change:  Last BM Date: 12/07/16  PE: GEN:  NAD, younger-appearing than stated age, jaundiced HEENT:  Scleral icterus ABD:  Soft, non-tender  Lab Results: CBC    Component Value Date/Time   WBC 10.6 (H) 12/07/2016 0436   RBC 2.79 (L) 12/07/2016 0436   HGB 8.9 (L) 12/07/2016 0436   HCT 26.2 (L) 12/07/2016 0436   PLT 326 12/07/2016 0436   MCV 93.9 12/07/2016 0436   MCH 31.9 12/07/2016 0436   MCHC 34.0 12/07/2016 0436   RDW 13.9 12/07/2016 0436   LYMPHSABS 1.1 11/25/2016 0510   MONOABS 0.7 11/25/2016 0510   EOSABS 0.1 11/25/2016 0510   BASOSABS 0.1 11/25/2016 0510   CMP     Component Value Date/Time   NA 134 (L) 12/07/2016 0436   K 2.8 (L) 12/07/2016 0436   CL 100 (L) 12/07/2016 0436   CO2 23 12/07/2016 0436   GLUCOSE 95 12/07/2016 0436   BUN 14 12/07/2016 0436   CREATININE 1.68 (H) 12/07/2016 0436   CALCIUM 8.2 (L) 12/07/2016 0436   PROT 5.4 (L) 12/07/2016 0436   ALBUMIN 2.2 (L) 12/07/2016 0436   AST 209 (H) 12/07/2016 0436   ALT 119 (H) 12/07/2016 0436   ALKPHOS 698 (H) 12/07/2016 0436   BILITOT 19.1 (HH) 12/07/2016 0436   GFRNONAA 35 (L) 12/07/2016 0436   GFRAA 41 (L) 12/07/2016 0436   Assessment:  1.  Pancreatic mass with elevated Ca 19-9.  No tissue diagnosis, but overall constellation of findings highly worrisome with adenocarcinoma. 2.  Elevated LFTs.  Suspect debris in stent versus tumor ingrowth versus tumor extension past levels of stent. 3.  Acute renal failure, improving. 4.  Hypokalemia.  Plan:  1.  IV antibiotics. 2.  Correct hypokalemia. 3.  IV fluids. 4.  Will try to arrange ERCP  tomorrow morning if we can correct patient's hypokalemia and if there is anesthesia time. 5.  Ultrasound today. 6.  Case discussed at length with patient, daughter, and hospitalist team.   Landry Dyke 12/07/2016, 2:53 PM   Pager [phone] If no answer or after 5 PM call [phone]

## 2016-12-08 ENCOUNTER — Inpatient Hospital Stay (HOSPITAL_COMMUNITY): Payer: Medicare Other | Admitting: Anesthesiology

## 2016-12-08 ENCOUNTER — Encounter (HOSPITAL_COMMUNITY)
Admission: AD | Disposition: A | Discharge status: Home Health | Payer: Self-pay | Source: Ambulatory Visit | Attending: Internal Medicine

## 2016-12-08 ENCOUNTER — Inpatient Hospital Stay (HOSPITAL_COMMUNITY): Payer: Medicare Other

## 2016-12-08 ENCOUNTER — Encounter (HOSPITAL_COMMUNITY): Payer: Self-pay

## 2016-12-08 HISTORY — PX: ERCP: SHX5425

## 2016-12-08 LAB — CBC
HEMATOCRIT: 25.3 % — AB (ref 39.0–52.0)
HEMOGLOBIN: 8.7 g/dL — AB (ref 13.0–17.0)
MCH: 32.2 pg (ref 26.0–34.0)
MCHC: 34.4 g/dL (ref 30.0–36.0)
MCV: 93.7 fL (ref 78.0–100.0)
Platelets: 287 10*3/uL (ref 150–400)
RBC: 2.7 MIL/uL — AB (ref 4.22–5.81)
RDW: 14.1 % (ref 11.5–15.5)
WBC: 8.7 10*3/uL (ref 4.0–10.5)

## 2016-12-08 LAB — COMPREHENSIVE METABOLIC PANEL
ALT: 121 U/L — ABNORMAL HIGH (ref 17–63)
ANION GAP: 7 (ref 5–15)
AST: 225 U/L — ABNORMAL HIGH (ref 15–41)
Albumin: 2.1 g/dL — ABNORMAL LOW (ref 3.5–5.0)
Alkaline Phosphatase: 858 U/L — ABNORMAL HIGH (ref 38–126)
BUN: 11 mg/dL (ref 6–20)
CHLORIDE: 103 mmol/L (ref 101–111)
CO2: 24 mmol/L (ref 22–32)
CREATININE: 1.53 mg/dL — AB (ref 0.61–1.24)
Calcium: 8.1 mg/dL — ABNORMAL LOW (ref 8.9–10.3)
GFR, EST AFRICAN AMERICAN: 46 mL/min — AB (ref >60)
GFR, EST NON AFRICAN AMERICAN: 40 mL/min — AB (ref >60)
Glucose, Bld: 138 mg/dL — ABNORMAL HIGH (ref 65–99)
POTASSIUM: 3.3 mmol/L — AB (ref 3.5–5.1)
Sodium: 134 mmol/L — ABNORMAL LOW (ref 135–145)
Total Bilirubin: 18.9 mg/dL (ref 0.3–1.2)
Total Protein: 5 g/dL — ABNORMAL LOW (ref 6.5–8.1)

## 2016-12-08 LAB — MAGNESIUM: MAGNESIUM: 1.4 mg/dL — AB (ref 1.7–2.4)

## 2016-12-08 SURGERY — ERCP, WITH INTERVENTION IF INDICATED
Anesthesia: General

## 2016-12-08 MED ORDER — SUGAMMADEX SODIUM 200 MG/2ML IV SOLN
INTRAVENOUS | Status: DC | PRN
  Administered 2016-12-08: 200 mg via INTRAVENOUS

## 2016-12-08 MED ORDER — ROCURONIUM BROMIDE 100 MG/10ML IV SOLN
INTRAVENOUS | Status: DC | PRN
  Administered 2016-12-08: 30 mg via INTRAVENOUS

## 2016-12-08 MED ORDER — ENOXAPARIN SODIUM 40 MG/0.4ML ~~LOC~~ SOLN
40.0000 mg | SUBCUTANEOUS | Status: DC
  Administered 2016-12-08 – 2016-12-12 (×5): 40 mg via SUBCUTANEOUS
  Filled 2016-12-08 (×5): qty 0.4

## 2016-12-08 MED ORDER — IOPAMIDOL (ISOVUE-300) INJECTION 61%
INTRAVENOUS | Status: AC
  Filled 2016-12-08: qty 50

## 2016-12-08 MED ORDER — POTASSIUM CHLORIDE CRYS ER 10 MEQ PO TBCR
EXTENDED_RELEASE_TABLET | ORAL | Status: AC
  Filled 2016-12-08: qty 4

## 2016-12-08 MED ORDER — FENTANYL CITRATE (PF) 100 MCG/2ML IJ SOLN
INTRAMUSCULAR | Status: DC | PRN
  Administered 2016-12-08 (×2): 50 ug via INTRAVENOUS

## 2016-12-08 MED ORDER — INDOMETHACIN 50 MG RE SUPP
RECTAL | Status: DC | PRN
  Administered 2016-12-08: 100 mg via RECTAL

## 2016-12-08 MED ORDER — LIDOCAINE HCL (CARDIAC) 20 MG/ML IV SOLN
INTRAVENOUS | Status: DC | PRN
  Administered 2016-12-08: 40 mg via INTRAVENOUS

## 2016-12-08 MED ORDER — PROPOFOL 10 MG/ML IV BOLUS
INTRAVENOUS | Status: DC | PRN
  Administered 2016-12-08 (×2): 20 mg via INTRAVENOUS
  Administered 2016-12-08: 50 mg via INTRAVENOUS

## 2016-12-08 MED ORDER — FENTANYL CITRATE (PF) 100 MCG/2ML IJ SOLN: 25.0000 ug | INTRAMUSCULAR | Status: DC | PRN

## 2016-12-08 MED ORDER — GLUCAGON HCL RDNA (DIAGNOSTIC) 1 MG IJ SOLR
INTRAMUSCULAR | Status: AC
  Filled 2016-12-08: qty 1

## 2016-12-08 MED ORDER — SODIUM CHLORIDE 0.9 % IV SOLN
INTRAVENOUS | Status: DC | PRN
  Administered 2016-12-08: 09:00:00 via INTRAVENOUS

## 2016-12-08 MED ORDER — INDOMETHACIN 50 MG RE SUPP
RECTAL | Status: AC
  Filled 2016-12-08: qty 2

## 2016-12-08 MED ORDER — POTASSIUM CHLORIDE CRYS ER 20 MEQ PO TBCR
40.0000 meq | EXTENDED_RELEASE_TABLET | Freq: Once | ORAL | Status: AC
  Administered 2016-12-08: 40 meq via ORAL

## 2016-12-08 MED ORDER — SODIUM CHLORIDE 0.9 % IV SOLN: INTRAVENOUS | Status: DC

## 2016-12-08 MED ORDER — ONDANSETRON HCL 4 MG/2ML IJ SOLN
INTRAMUSCULAR | Status: DC | PRN
  Administered 2016-12-08: 4 mg via INTRAVENOUS

## 2016-12-08 MED ORDER — INDOMETHACIN 50 MG RE SUPP
100.0000 mg | Freq: Once | RECTAL | Status: AC
  Administered 2016-12-08: 100 mg via RECTAL

## 2016-12-08 MED ORDER — PHENYLEPHRINE HCL 10 MG/ML IJ SOLN
INTRAVENOUS | Status: DC | PRN
  Administered 2016-12-08: 20 ug/min via INTRAVENOUS

## 2016-12-08 MED ORDER — PHENYLEPHRINE HCL 10 MG/ML IJ SOLN
INTRAMUSCULAR | Status: DC | PRN
  Administered 2016-12-08: 80 ug via INTRAVENOUS

## 2016-12-08 NOTE — Progress Notes (Signed)
Patient ID: [name] [name], male   DOB: 02/22/1929, 83 y.o.   MRN: [id number]    PROGRESS NOTE    [name] [name]  [name] DOB: 1929/01/12 DOA: 12/06/2016  PCP: Limmie Patricia, MD   Brief Narrative:  83 y.o.gentleman with a history of HTN, HLD, CKD 3, chronic anemia, GERD, and biliary obstruction requiring CBD stent in November 2016 recently hospitalized for sepsis, E. Coli bacteremia, prostate cancer, on last admission was found to have transaminitis and hyperbilirubinemia and these enzymes trended down during the hospital stay and pt was discharge with intent to follow up with Dr. Paulita Fujita. Dr. Paulita Fujita requested now direct admission as blood work was notable for rise in Cr to 2.03 (from 1.3 on recent discharge), bilirubin also noted to be 19 (was normal about 10 days ago). Pt reports noticing progressively worsening jaundice in the past week and occasional epigastric pain, dull and intermittent with no specific alleviating or aggravating factors, occasionally associated with nausea and per daughter and wife at bedside also some confusion. Dr. Paulita Fujita plans to perform ERCP on Monday. Pt currently denies chest pain or dyspnea, no fevers, chills, and currently no abd pain.   Assessment & Plan:     Jaundice, transaminitis  - in the setting of known pancreatic mass 11/22/2016, 2.6 x 3.4 cm in size - also on the same CT scan, interval development of diffuse dilatation of the main pancreatic duct - pt already with hx of stent placed - bilirubin still 19, abd [place] with ? Gallstones, ERCP today  - continue Cipro and Flagyl day #3 per GI team  - CMET in AM    AKI imposed on CKD stage III  - reported Cr 2.03 prior to this admission  - appears to be pre renal - IVF and Cr is trending down  - BMP in AM    Anemia of chronic disease - ? Malignancy related - no signs of active bleeding  - CBC in AM    Hyponatremia - also pre renal - continue to monitor    Hypokalemia - supplement K and  repeat BMP in AM  DVT prophylaxis: Lovenox SQ Code Status: Full  Family Communication: Patient and family at bedside  Disposition Plan: Home once GI team clears   Consultants:   GI  Procedures:   None  Antimicrobials:   Cipro 3/2 -->  Flagyl 3/2 -->  Subjective: No events overnight.   Objective: Vitals:   12/07/16 1243 12/07/16 2114 12/08/16 0510 12/08/16 0855  BP: 108/78 140/69 132/70 127/66  Pulse: (!) 103 94 87 86  Resp: 18 17 19  (!) 25  Temp: 98.5 F (36.9 C) 99.1 F (37.3 C) 98.1 F (36.7 C) 98.1 F (36.7 C)  TempSrc: Oral Oral  Oral  SpO2: 98% 99% 98% 95%  Weight:      Height:        Intake/Output Summary (Last 24 hours) at 12/08/16 0914 Last data filed at 12/08/16 0848  Gross per 24 hour  Intake              200 ml  Output              800 ml  Net             -600 ml   Filed Weights   12/06/16 1708  Weight: 70.3 kg (155 lb)    Examination:  General exam: Appears calm and comfortable, jaundiced  Respiratory system: Clear to auscultation. Respiratory effort normal. Cardiovascular system: No  JVD, murmurs, rubs, gallops or clicks. No pedal edema. Gastrointestinal system: No organomegaly or masses felt. Normal bowel sounds heard. Central nervous system: Alert and oriented. No focal neurological deficits.  Data Reviewed: I have personally reviewed following labs and imaging studies  CBC:  Recent Labs Lab 12/06/16 1900 12/07/16 0436 12/08/16 0516  WBC 12.5* 10.6* 8.7  HGB 9.8* 8.9* 8.7*  HCT 29.2* 26.2* 25.3*  MCV 94.5 93.9 93.7  PLT 342 326 [id number]   Basic Metabolic Panel:  Recent Labs Lab 12/06/16 1900 12/07/16 0436 12/07/16 1738 12/08/16 0516  NA 134* 134*  --  134*  K 3.1* 2.8* 3.6 3.3*  CL 96* 100*  --  103  CO2 24 23  --  24  GLUCOSE 102* 95  --  138*  BUN 15 14  --  11  CREATININE 1.95* 1.68*  --  1.53*  CALCIUM 8.7* 8.2*  --  8.1*  MG 1.7  --   --  1.4*  PHOS 3.4  --   --   --    Liver Function Tests:  Recent  Labs Lab 12/06/16 1900 12/07/16 0436 12/08/16 0516  AST 229* 209* 225*  ALT 132* 119* 121*  ALKPHOS 658* 698* 858*  BILITOT 19.1* 19.1* 18.9*  PROT 6.0* 5.4* 5.0*  ALBUMIN 2.7* 2.2* 2.1*    Recent Labs Lab 12/07/16 0436  LIPASE 11   Urine analysis:    Component Value Date/Time   COLORURINE YELLOW 11/22/2016 2052   APPEARANCEUR CLEAR 11/22/2016 2052   LABSPEC 1.013 11/22/2016 2052   PHURINE 5.0 11/22/2016 2052   GLUCOSEU NEGATIVE 11/22/2016 2052   HGBUR LARGE (A) 11/22/2016 2052   [place] NEGATIVE 11/22/2016 2052   [place] NEGATIVE 11/22/2016 2052   PROTEINUR 30 (A) 11/22/2016 2052   NITRITE NEGATIVE 11/22/2016 2052   LEUKOCYTESUR NEGATIVE 11/22/2016 2052   Radiology Studies: US Abdomen Complete  Result Date: 12/07/2016 CLINICAL DATA:  Hyper bilirubin the media, jaundice. EXAM: ABDOMEN ULTRASOUND COMPLETE COMPARISON:  Abdomen ultrasound dated 09/01/2015. CT abdomen dated 11/22/2016. FINDINGS: Gallbladder: Mobile sludge again noted within the gallbladder. No gallbladder wall thickening, pericholecystic fluid or other secondary signs of acute cholecystitis. Common bile duct: Diameter: Proximal common bile duct measures 9 mm diameter and mid common bile duct measures 13 mm diameter. There are 2 echogenic foci within the proximal and mid common bile duct, largest measuring 6 mm, highly suspicious for intraductal stones. Liver: No focal lesion identified. Within normal limits in parenchymal echogenicity. IVC: No abnormality visualized. Pancreas: Visualized portion unremarkable. Spleen: Size and appearance within normal limits. Right Kidney: Length: 11.6 cm. Echogenicity within normal limits. No mass or hydronephrosis visualized. Left Kidney: Length: 10.5 cm. Echogenicity within normal limits. No mass or hydronephrosis visualized. Abdominal aorta: No aneurysm visualized. Other findings: None. IMPRESSION: 1. Probable stones within the dilated CBD, largest measuring 6 mm, with bile  duct dilatation up to 13 mm diameter. Would consider confirmation with MRCP or ERCP. 2. Sludge within the gallbladder. No gallstones seen. No evidence of cholecystitis. Electronically Signed   By: Franki Cabot M.D.   On: 12/07/2016 20:22    Scheduled Meds: . [MAR Hold] calcium carbonate  500 mg of elemental calcium Oral Q breakfast  . [MAR Hold] ciprofloxacin  200 mg Intravenous Q12H  . [MAR Hold] enoxaparin (LOVENOX) injection  30 mg Subcutaneous Q24H  . [MAR Hold] feeding supplement (ENSURE ENLIVE)  237 mL Oral BID BM  . [MAR Hold] folic acid  1 mg Oral Daily  . [facility] Hold]  metronidazole  500 mg Intravenous Q12H  . [MAR Hold] mirabegron ER  50 mg Oral Daily  . [MAR Hold] multivitamin  1 tablet Oral Daily  . [MAR Hold] multivitamin with minerals  1 tablet Oral Daily  . [MAR Hold] pantoprazole  40 mg Oral Daily  . [MAR Hold] polyvinyl alcohol  1 drop Both Eyes BID  . [MAR Hold] sodium chloride flush  3 mL Intravenous Q12H  . [MAR Hold] thiamine  100 mg Oral Daily   Continuous Infusions:    LOS: 2 days    Time spent: 20 minutes    Faye Ramsay, MD [facility] Pager [phone]  If 7PM-7AM, please contact night-coverage [email or web address] Password [facility] 12/08/2016, 9:14 AM

## 2016-12-08 NOTE — Op Note (Addendum)
[facility] Patient Name: Tenor Casa Procedure Date : 12/08/2016 MRN: [id number] Attending MD: Arta Silence , MD Date of Birth: 1929-09-06 CSN: [id number] Age: 83 Admit Type: Inpatient Procedure:                ERCP with bile duct stent removal/replacement, bile                            duct stone remova Indications:              Bile duct stone on Ultrasound, Jaundice Providers:                Arta Silence, MD, Dortha Schwalbe RN, RN, Alfonso Patten, Technician, Jacquiline Doe, CRNA, Otis Brace, MD Referring MD:             [facility] Medicines:                Sedation Administered by an Anesthesia Professional                            (GETA, Indomethacin 100 mg PR, Cipro/Flagyl earlier                            this morning) Complications:            No immediate complications. Estimated Blood Loss:     Estimated blood loss was minimal. Procedure:                Pre-Anesthesia Assessment:                           - Prior to the procedure, a History and Physical                            was performed, and patient medications and                            allergies were reviewed. The patient's tolerance of                            previous anesthesia was also reviewed. The risks                            and benefits of the procedure and the sedation                            options and risks were discussed with the patient.                            All questions were answered, and informed consent  was obtained. Prior Anticoagulants: The patient has                            taken no previous anticoagulant or antiplatelet                            agents. ASA Grade Assessment: III - A patient with                            severe systemic disease. After reviewing the risks                            and benefits, the patient was deemed in                             satisfactory condition to undergo the procedure.                           After obtaining informed consent, the scope was                            passed under direct vision. Throughout the                            procedure, the patient's blood pressure, pulse, and                            oxygen saturations were monitored continuously. The                            [id number]) scope was introduced through                            the mouth, and used to inject contrast into and                            used to inject contrast into the bile duct. The                            ERCP was performed with moderate difficulty. The                            patient tolerated the procedure well.                           Previously placed stent was seen coming out through                            the ampullary orifice and had significantly                            migrated distally. The stent was removed using  tract toothed forcep. Stent was only into the                            stomach. Subsequently stent was grabbed with snare                            and was pulled through the endoscopic channel.                            Stent was intact.scope was advance again to                            ampullary orifice. Selective cannulation of common                            bile duct was performed. Contrast was injected                            which showed possible filling defects into the mid                            and distal common bile duct and short 2cm distal                            CBC stricture. 12 millimeter balloon was used to                            swept common bile duct.significant amount of clots,                            stones, debris and pus were removed with balloon                            sweep. subsequent occlusion cholangiogram showed no                            filling defects. One 10 [place] by 6  cm fully                            covered metal stent was placed. Following stent                            placement, there was good flow of bile noted.                            Appropriate positioning and function of stent                            confirmed endoscopically and fluoroscopically. Scope In: Scope Out: Findings:      One stent was removed from the common bile duct using a rat-toothed       forceps.stent was sent for cytologic evaluation. The biliary tree was       swept  with a 12 mm balloon starting at the upper third of the main bile       duct. Clots were swept from the duct. Debris was swept from the duct.       Pus was swept from the duct. Mucus was swept from the duct. One 10 Fr by       6 cm covered metal stent was placed into the common bile duct. Pus       flowed through the stent. The stent was in good position. Impression:               - One stent was removed from the common bile duct.                            stent sent for cytologic evaluation.                           - The biliary tree was swept and clots, debris, pus                            and mucus were found.                           - One covered metal stent was placed into the                            common bile duct.                           - Overall constellation of findings most typical of                            cholangitis from migrated and occluded biliary                            wallstent. Moderate Sedation:      None Recommendation:           - Return patient to hospital ward for ongoing care.                           - Clear liquid diet today.                           - Observe patient's clinical course.                           - Watch for pancreatitis, bleeding, perforation,                            and cholangitis.                           - Check liver enzymes (AST, ALT, alkaline                            phosphatase, bilirubin) in the morning.                            -  Await stent cytology; if no malignant cells seen,                            would need elective EUS with possible pancreatic                            biopsy (would likely wait a couple weeks, so he can                            recover from his cholangitis).                           [facility] will follow. Procedure Code(s):        --- Professional ---                           [phone], Endoscopic retrograde                            cholangiopancreatography (ERCP); with removal and                            exchange of stent(s), biliary or pancreatic duct,                            including pre- and post-dilation and guide wire                            passage, when performed, including sphincterotomy,                            when performed, each stent exchanged                           [id number], Endoscopic retrograde                            cholangiopancreatography (ERCP); with removal of                            calculi/debris from biliary/pancreatic duct(s) Diagnosis Code(s):        --- Professional ---                           [id number], Encounter for fitting and adjustment of                            other gastrointestinal appliance and device                           D49.0, Neoplasm of unspecified behavior of                            digestive system  K80.50, Calculus of bile duct without cholangitis                            or cholecystitis without obstruction                           R17, Unspecified jaundice CPT copyright 2016 American Medical Association. All rights reserved. The codes documented in this report are preliminary and upon coder review may  be revised to meet current compliance requirements. Arta Silence, MD 12/08/2016 11:42:38 AM This report has been signed electronically. Otis Brace, MD Otis Brace, MD 12/08/2016 11:45:17 AM Number of Addenda: 0

## 2016-12-08 NOTE — Anesthesia Preprocedure Evaluation (Signed)
Anesthesia Evaluation  Patient identified by MRN, date of birth, ID band Patient awake    Reviewed: Allergy & Precautions, NPO status , Patient's Chart, lab work & pertinent test results  History of Anesthesia Complications Negative for: history of anesthetic complications  Airway Mallampati: II  TM Distance: >3 FB Neck ROM: Full    Dental  (+) Teeth Intact, Upper Dentures   Pulmonary former smoker,    breath sounds clear to auscultation- rhonchi       Cardiovascular hypertension, Pt. on medications  Rhythm:Regular     Neuro/Psych PSYCHIATRIC DISORDERS Depression  Neuromuscular disease    GI/Hepatic Neg liver ROS, hiatal hernia, Pancreatic mass with obstruction and jaundice   Endo/Other  negative endocrine ROS  Renal/GU Renal InsufficiencyRenal disease     Musculoskeletal  (+) Arthritis ,   Abdominal   Peds  Hematology  (+) anemia ,   Anesthesia Other Findings   Reproductive/Obstetrics                             Anesthesia Physical Anesthesia Plan  ASA: III  Anesthesia Plan: General   Post-op Pain Management:    Induction: Intravenous  Airway Management Planned: Oral ETT  Additional Equipment: None  Intra-op Plan:   Post-operative Plan: Extubation in OR  Informed Consent: I have reviewed the patients History and Physical, chart, labs and discussed the procedure including the risks, benefits and alternatives for the proposed anesthesia with the patient or authorized representative who has indicated his/her understanding and acceptance.   Dental advisory given  Plan Discussed with: CRNA and Surgeon  Anesthesia Plan Comments:         Anesthesia Quick Evaluation

## 2016-12-08 NOTE — Transfer of Care (Signed)
Immediate Anesthesia Transfer of Care Note  Patient: [name] [name]  Procedure(s) Performed: Procedure(s): ENDOSCOPIC RETROGRADE CHOLANGIOPANCREATOGRAPHY (ERCP) (N/A)  Patient Location: PACU  Anesthesia Type:General  Level of Consciousness: awake, oriented, sedated, patient cooperative and responds to stimulation  Airway & Oxygen Therapy: Patient Spontanous Breathing and Patient connected to nasal cannula oxygen  Post-op Assessment: Report given to RN and Post -op Vital signs reviewed and stable  Post vital signs: Reviewed and stable  Last Vitals:  Vitals:   12/08/16 0510 12/08/16 0855  BP: 132/70 127/66  Pulse: 87 86  Resp: 19 (!) 25  Temp: 36.7 C 36.7 C    Last Pain:  Vitals:   12/08/16 0855  TempSrc: Oral  PainSc:          Complications: No apparent anesthesia complications

## 2016-12-08 NOTE — Anesthesia Procedure Notes (Signed)
Procedure Name: Intubation Date/Time: 12/08/2016 9:38 AM Performed by: Jacquiline Doe A Pre-anesthesia Checklist: Patient identified, Emergency Drugs available, Suction available and Patient being monitored Patient Re-evaluated:Patient Re-evaluated prior to inductionOxygen Delivery Method: Circle System Utilized and Circle system utilized Preoxygenation: Pre-oxygenation with 100% oxygen Intubation Type: IV induction and Cricoid Pressure applied Ventilation: Mask ventilation without difficulty Laryngoscope Size: Mac and 4 Grade View: Grade I Tube type: Oral Tube size: 7.5 mm Number of attempts: 1 Airway Equipment and Method: Stylet Placement Confirmation: ETT inserted through vocal cords under direct vision,  positive ETCO2 and breath sounds checked- equal and bilateral Secured at: 22 cm Tube secured with: Tape Dental Injury: Teeth and Oropharynx as per pre-operative assessment

## 2016-12-08 NOTE — Anesthesia Postprocedure Evaluation (Signed)
Anesthesia Post Note  Patient: [name] [name]  Procedure(s) Performed: Procedure(s) (LRB): ENDOSCOPIC RETROGRADE CHOLANGIOPANCREATOGRAPHY (ERCP) (N/A)  Patient location during evaluation: PACU Anesthesia Type: General Level of consciousness: awake and alert and patient cooperative Pain management: pain level controlled Vital Signs Assessment: post-procedure vital signs reviewed and stable Respiratory status: spontaneous breathing and respiratory function stable Cardiovascular status: stable Anesthetic complications: no       Last Vitals:  Vitals:   12/08/16 1219 12/08/16 1428  BP: (!) 123/59 124/77  Pulse: 69 71  Resp: 18 (!) 24  Temp: 36.7 C 36.5 C    Last Pain:  Vitals:   12/08/16 1219  TempSrc: Oral  PainSc:                  [place]

## 2016-12-08 NOTE — Interval H&P Note (Signed)
History and Physical Interval Note:  12/08/2016 9:13 AM  [name] [name]  has presented today for surgery, with the diagnosis of obstructive jaundice  The various methods of treatment have been discussed with the patient and family. After consideration of risks, benefits and other options for treatment, the patient has consented to  Procedure(s): ENDOSCOPIC RETROGRADE CHOLANGIOPANCREATOGRAPHY (ERCP) (N/A) as a surgical intervention .  The patient's history has been reviewed, patient examined, no change in status, stable for surgery.  I have reviewed the patient's chart and labs.  Questions were answered to the patient's satisfaction.     Landry Dyke

## 2016-12-09 ENCOUNTER — Encounter (HOSPITAL_COMMUNITY)
Admission: AD | Disposition: A | Discharge status: Home Health | Payer: Self-pay | Source: Ambulatory Visit | Attending: Internal Medicine

## 2016-12-09 ENCOUNTER — Encounter (HOSPITAL_COMMUNITY): Payer: Self-pay | Admitting: Gastroenterology

## 2016-12-09 LAB — COMPREHENSIVE METABOLIC PANEL
ALBUMIN: 2 g/dL — AB (ref 3.5–5.0)
ALT: 116 U/L — ABNORMAL HIGH (ref 17–63)
AST: 186 U/L — AB (ref 15–41)
Alkaline Phosphatase: 751 U/L — ABNORMAL HIGH (ref 38–126)
Anion gap: 5 (ref 5–15)
BUN: 11 mg/dL (ref 6–20)
CHLORIDE: 104 mmol/L (ref 101–111)
CO2: 24 mmol/L (ref 22–32)
Calcium: 7.8 mg/dL — ABNORMAL LOW (ref 8.9–10.3)
Creatinine, Ser: 1.48 mg/dL — ABNORMAL HIGH (ref 0.61–1.24)
GFR calc Af Amer: 48 mL/min — ABNORMAL LOW (ref >60)
GFR, EST NON AFRICAN AMERICAN: 41 mL/min — AB (ref >60)
Glucose, Bld: 124 mg/dL — ABNORMAL HIGH (ref 65–99)
POTASSIUM: 3.3 mmol/L — AB (ref 3.5–5.1)
SODIUM: 133 mmol/L — AB (ref 135–145)
Total Bilirubin: 20.3 mg/dL (ref 0.3–1.2)
Total Protein: 5.2 g/dL — ABNORMAL LOW (ref 6.5–8.1)

## 2016-12-09 LAB — CBC
HCT: 23.3 % — ABNORMAL LOW (ref 39.0–52.0)
Hemoglobin: 8 g/dL — ABNORMAL LOW (ref 13.0–17.0)
MCH: 32.9 pg (ref 26.0–34.0)
MCHC: 34.3 g/dL (ref 30.0–36.0)
MCV: 95.9 fL (ref 78.0–100.0)
Platelets: 264 10*3/uL (ref 150–400)
RBC: 2.43 MIL/uL — ABNORMAL LOW (ref 4.22–5.81)
RDW: 14.2 % (ref 11.5–15.5)
WBC: 7.3 10*3/uL (ref 4.0–10.5)

## 2016-12-09 SURGERY — ERCP, WITH INTERVENTION IF INDICATED
Anesthesia: General

## 2016-12-09 NOTE — Progress Notes (Signed)
Subjective: No abdominal pain.  Objective: Vital signs in last 24 hours: Temp:  [97.4 F (36.3 C)-98.8 F (37.1 C)] 98 F (36.7 C) (03/05 0446) Pulse Rate:  [69-75] 72 (03/05 0446) Resp:  [17-25] 17 (03/05 0446) BP: (123-134)/(59-77) 131/67 (03/05 0446) SpO2:  [95 %-100 %] 99 % (03/05 0446) Weight change:  Last BM Date: 12/07/16  PE: GEN:  Elderly, NAD ABD:  Soft, non-tender  Lab Results: CBC    Component Value Date/Time   WBC 7.3 12/09/2016 0455   RBC 2.43 (L) 12/09/2016 0455   HGB 8.0 (L) 12/09/2016 0455   HCT 23.3 (L) 12/09/2016 0455   PLT 264 12/09/2016 0455   MCV 95.9 12/09/2016 0455   MCH 32.9 12/09/2016 0455   MCHC 34.3 12/09/2016 0455   RDW 14.2 12/09/2016 0455   LYMPHSABS 1.1 11/25/2016 0510   MONOABS 0.7 11/25/2016 0510   EOSABS 0.1 11/25/2016 0510   BASOSABS 0.1 11/25/2016 0510   CMP     Component Value Date/Time   NA 133 (L) 12/09/2016 0455   K 3.3 (L) 12/09/2016 0455   CL 104 12/09/2016 0455   CO2 24 12/09/2016 0455   GLUCOSE 124 (H) 12/09/2016 0455   BUN 11 12/09/2016 0455   CREATININE 1.48 (H) 12/09/2016 0455   CALCIUM 7.8 (L) 12/09/2016 0455   PROT 5.2 (L) 12/09/2016 0455   ALBUMIN 2.0 (L) 12/09/2016 0455   AST 186 (H) 12/09/2016 0455   ALT 116 (H) 12/09/2016 0455   ALKPHOS 751 (H) 12/09/2016 0455   BILITOT 20.3 ([place]) 12/09/2016 0455   GFRNONAA 41 (L) 12/09/2016 0455   GFRAA 48 (L) 12/09/2016 0455    Assessment:  1.  Pancreatic mass with elevated Ca 19-9.  No tissue diagnosis, but overall constellation of findings highly worrisome with adenocarcinoma. 2.  Elevated LFTs.  Suspect stent occlusion with stones/debris/clots.  Stent removal and replacement yesterday.  Drainage appeared good at end of procedure, hopefully LFTs will decrease over the next couple days. 3.  Acute renal failure, improving. 4.  Hypokalemia.  Plan:  1.  Advance to soft diet. 2.  Follow LFTs; if not improving over the next couple days, may have to repeat ercp to  make sure there is no further occlusion of this new stent (with debris/clot not appreciated yesterday). 3.  Continue antibiotics. 4.  [facility] will follow.   Landry Dyke 12/09/2016, 9:45 AM   Pager [phone] If no answer or after 5 PM call [phone]

## 2016-12-09 NOTE — Evaluation (Signed)
Physical Therapy Evaluation Patient Details Name: [name] [name] MRN: [id number] DOB: 04/13/1929 Today's Date: 12/09/2016   History of Present Illness  pt presents with Jaundice and is now s/p ERCP with Bile Dcut Stone Removed, and Bile Duct Stent Replaced.  pt also with Transaminitis and Pancreatic Mass.  pt with hx of HTN, CKD, Prostate CA, Shingles, and Chronic Renal Insfficiency.    Clinical Impression  Pt eager for ambulation and very pleasant.  Pt able to ambulate in hallway with RW and famiyl present to confirm that pt will have A at home with D/C from hospital.  Feel pt would benefit from resuming his HHPT for continued therapies.  Will continue to follow.      Follow Up Recommendations Home health PT;Supervision/Assistance - 24 hour    Equipment Recommendations  None recommended by PT    Recommendations for Other Services       Precautions / Restrictions Precautions Precautions: Fall Restrictions Weight Bearing Restrictions: No      Mobility  Bed Mobility               General bed mobility comments: pt sitting in recliner.  Transfers Overall transfer level: Needs assistance Equipment used: Rolling walker (2 wheeled) Transfers: Sit to/from Stand Sit to Stand: Min assist         General transfer comment: Demonstrates good use of UEs, but MinA for power up to standing.    Ambulation/Gait Ambulation/Gait assistance: Min guard Ambulation Distance (Feet): 100 Feet Assistive device: Rolling walker (2 wheeled) Gait Pattern/deviations: Step-through pattern;Decreased stride length;Trunk flexed     General Gait Details: cues for more upright posture and staying closer to RW.    Stairs            Wheelchair Mobility    Modified Rankin (Stroke Patients Only)       Balance Overall balance assessment: Needs assistance Sitting-balance support: No upper extremity supported;Feet supported Sitting balance-Leahy Scale: Fair     Standing balance  support: Bilateral upper extremity supported;Single extremity supported;During functional activity Standing balance-Leahy Scale: Poor                               Pertinent Vitals/Pain Pain Assessment: No/denies pain    Home Living Family/patient expects to be discharged to:: Private residence Living Arrangements: Spouse/significant other Available Help at Discharge: Family;Available 24 hours/day (Wife and other family) Type of Home: House Home Access: Stairs to enter Entrance Stairs-Rails: Right Entrance Stairs-Number of Steps: 1 Home Layout: One level Home Equipment: Walker - 2 wheels;Cane - quad;Bedside commode;Shower seat;Grab bars - toilet;Grab bars - tub/shower Additional Comments: Daughter states that either family or hired nursing will be with the pt upon returing home.     Prior Function Level of Independence: Independent         Comments: driving and active     Hand Dominance        Extremity/Trunk Assessment   Upper Extremity Assessment Upper Extremity Assessment: Generalized weakness    Lower Extremity Assessment Lower Extremity Assessment: Generalized weakness    Cervical / Trunk Assessment Cervical / Trunk Assessment: Kyphotic  Communication   Communication: No difficulties  Cognition Arousal/Alertness: Awake/alert Behavior During Therapy: WFL for tasks assessed/performed Overall Cognitive Status: Within Functional Limits for tasks assessed                 General Comments: HOH, but cognitively seems intact.    General Comments  Exercises     Assessment/Plan    PT Assessment Patient needs continued PT services  PT Problem List Decreased strength;Decreased activity tolerance;Decreased balance;Decreased mobility;Decreased knowledge of use of DME       PT Treatment Interventions DME instruction;Gait training;Stair training;Functional mobility training;Therapeutic activities;Therapeutic exercise;Balance  training;Patient/family education    PT Goals (Current goals can be found in the Care Plan section)  Acute Rehab PT Goals Patient Stated Goal: go home PT Goal Formulation: With patient/family Time For Goal Achievement: 12/23/16 Potential to Achieve Goals: Good    Frequency Min 3X/week   Barriers to discharge        Co-evaluation               End of Session Equipment Utilized During Treatment: Gait belt Activity Tolerance: Patient tolerated treatment well Patient left: in chair;with call bell/phone within reach;with chair alarm set;with family/visitor present Nurse Communication: Mobility status PT Visit Diagnosis: Unsteadiness on feet (R26.81)         Time: [id number] PT Time Calculation (min) (ACUTE ONLY): 22 min   Charges:   PT Evaluation $PT Eval Moderate Complexity: 1 Procedure     PT G CodesThornton Papas Fawnda Vitullo, PT  [phone] 12/09/2016, 10:45 AM

## 2016-12-09 NOTE — Consult Note (Addendum)
[facility] Nurse wound consult note Reason for Consult: Consult requested for buttocks and perineum.  Pt states he was wet for a prolonged period of time last night when his condom catheter leaked and the  Skin is now painful and burning. Wound type: bilat buttocks surrounding rectum are red and macerated; affected area is approx 5X5cm; appearance is consistent with moisture associated skin damage. This is also the same appearance to the posterior area of his scrotum and inner groin. Pressure Injury POA: This is NOT a pressure injury Dressing procedure/placement/frequency: Barrier cream to protect skin and repel moisture.  Pt currently has a condom cath in place to contain urine.  Air mattress ordered to decrease pressure and increase airflow to the affected areas.  Discussed plan of care with patient and family member and they verbalized understanding. Please re-consult if further assistance is needed.  Thank-you,  Julien Girt MSN, [place], [place], [place], [place]

## 2016-12-09 NOTE — Progress Notes (Signed)
Patient ID: [name] [name], male   DOB: 05-04-1929, 83 y.o.   MRN: [id number]    PROGRESS NOTE    [name] [name]  [id number] DOB: 1928-11-23 DOA: 12/06/2016  PCP: Limmie Patricia, MD   Brief Narrative:  83 y.o.gentleman with a history of HTN, HLD, CKD 3, chronic anemia, GERD, and biliary obstruction requiring CBD stent in November 2016 recently hospitalized for sepsis, E. Coli bacteremia, prostate cancer, on last admission was found to have transaminitis and hyperbilirubinemia and these enzymes trended down during the hospital stay and pt was discharge with intent to follow up with Dr. Paulita Fujita. Dr. Paulita Fujita requested now direct admission as blood work was notable for rise in Cr to 2.03 (from 1.3 on recent discharge), bilirubin also noted to be 19 (was normal about 10 days ago). Pt reports noticing progressively worsening jaundice in the past week and occasional epigastric pain, dull and intermittent with no specific alleviating or aggravating factors, occasionally associated with nausea and per daughter and wife at bedside also some confusion. Dr. Paulita Fujita plans to perform ERCP on Monday. Pt currently denies chest pain or dyspnea, no fevers, chills, and currently no abd pain.   Assessment & Plan:     Jaundice, transaminitis, acute cholangitis, gallstones, pancreatic mass with elevated Ca 19-9 - in the setting of known pancreatic mass 11/22/2016, 2.6 x 3.4 cm in size - also on the same CT scan, interval development of diffuse dilatation of the main pancreatic duct - pt is s/p ERCP which was done 3/4, stent occlusion with stones/debris/clots - stent was removed and replaced 3/4, drainage appeared good at the end of the procedure per Dr. Paulita Fujita  - will need to continue to trend LFT's as still elevated, continue Cipro and flagyl day #4 - per Dr. Paulita Fujita, pt may need repeat ERCP depending in clinical progress  - pancreatic mass is highly worrisome for adenocarcinoma, no tissue diagnosis available  at this time     AKI imposed on CKD stage III  - reported Cr 2.03 prior to this admission  - appears to be pre renal - IVF have been provided and Cr is trending down  - BMP in AM    Anemia of chronic disease - ? Malignancy related - no signs of active bleeding  - CBC in AM    Skin injury, Bilat buttocks surrounding rectum are red and macerated, not a pressure injury  - affected area is approx 5X5cm; appearance is consistent with moisture associated skin damage - this is also the same appearance to the posterior area of his scrotum and inner groin - Dressing procedure/placement/frequency: Barrier cream to protect skin and repel moisture - Air mattress ordered to decrease pressure and increase airflow to the affected areas.   - appreciate wound care team following     Hyponatremia - also pre renal - continue to monitor    Hypokalemia - supplement K and repeat BMP in AM  DVT prophylaxis: Lovenox SQ Code Status: Full  Family Communication: Patient and family at bedside  Disposition Plan: Home once GI team clears   Consultants:   GI  Procedures:   ERCP 3/4 -->   Antimicrobials:   Cipro 3/2 -->  Flagyl 3/2 -->  Subjective: No events overnight.   Objective: Vitals:   12/08/16 1219 12/08/16 1428 12/08/16 2200 12/09/16 0446  BP: (!) 123/59 124/77 134/71 131/67  Pulse: 69 71 69 72  Resp: 18 (!) 24 17 17   Temp: 98.1 F (36.7 C) 97.7 F (36.5 C)  97.4 F (36.3 C) 98 F (36.7 C)  TempSrc: Oral     SpO2: 100% 100% 99% 99%  Weight:      Height:        Intake/Output Summary (Last 24 hours) at 12/09/16 1302 Last data filed at 12/09/16 0446  Gross per 24 hour  Intake                0 ml  Output             1025 ml  Net            -1025 ml   Filed Weights   12/06/16 1708  Weight: 70.3 kg (155 lb)    Examination:  General exam: Appears calm and comfortable, jaundiced  Respiratory system: Clear to auscultation. Respiratory effort normal. Cardiovascular  system: No JVD, murmurs, rubs, gallops or clicks. No pedal edema. Gastrointestinal system: No organomegaly or masses felt. Normal bowel sounds heard. Central nervous system: Alert and oriented. No focal neurological deficits.  Data Reviewed: I have personally reviewed following labs and imaging studies  CBC:  Recent Labs Lab 12/06/16 1900 12/07/16 0436 12/08/16 0516 12/09/16 0455  WBC 12.5* 10.6* 8.7 7.3  HGB 9.8* 8.9* 8.7* 8.0*  HCT 29.2* 26.2* 25.3* 23.3*  MCV 94.5 93.9 93.7 95.9  PLT 342 326 287 [id number]   Basic Metabolic Panel:  Recent Labs Lab 12/06/16 1900 12/07/16 0436 12/07/16 1738 12/08/16 0516 12/09/16 0455  NA 134* 134*  --  134* 133*  K 3.1* 2.8* 3.6 3.3* 3.3*  CL 96* 100*  --  103 104  CO2 24 23  --  24 24  GLUCOSE 102* 95  --  138* 124*  BUN 15 14  --  11 11  CREATININE 1.95* 1.68*  --  1.53* 1.48*  CALCIUM 8.7* 8.2*  --  8.1* 7.8*  MG 1.7  --   --  1.4*  --   PHOS 3.4  --   --   --   --    Liver Function Tests:  Recent Labs Lab 12/06/16 1900 12/07/16 0436 12/08/16 0516 12/09/16 0455  AST 229* 209* 225* 186*  ALT 132* 119* 121* 116*  ALKPHOS 658* 698* 858* 751*  BILITOT 19.1* 19.1* 18.9* 20.3*  PROT 6.0* 5.4* 5.0* 5.2*  ALBUMIN 2.7* 2.2* 2.1* 2.0*    Recent Labs Lab 12/07/16 0436  LIPASE 11   Urine analysis:    Component Value Date/Time   COLORURINE YELLOW 11/22/2016 2052   APPEARANCEUR CLEAR 11/22/2016 2052   LABSPEC 1.013 11/22/2016 2052   PHURINE 5.0 11/22/2016 2052   GLUCOSEU NEGATIVE 11/22/2016 2052   HGBUR LARGE (A) 11/22/2016 2052   BILIRUBINUR NEGATIVE 11/22/2016 2052   [place] NEGATIVE 11/22/2016 2052   PROTEINUR 30 (A) 11/22/2016 2052   NITRITE NEGATIVE 11/22/2016 2052   LEUKOCYTESUR NEGATIVE 11/22/2016 2052   Radiology Studies: US Abdomen Complete  Result Date: 12/07/2016 CLINICAL DATA:  Hyper bilirubin the media, jaundice. EXAM: ABDOMEN ULTRASOUND COMPLETE COMPARISON:  Abdomen ultrasound dated 09/01/2015. CT abdomen  dated 11/22/2016. FINDINGS: Gallbladder: Mobile sludge again noted within the gallbladder. No gallbladder wall thickening, pericholecystic fluid or other secondary signs of acute cholecystitis. Common bile duct: Diameter: Proximal common bile duct measures 9 mm diameter and mid common bile duct measures 13 mm diameter. There are 2 echogenic foci within the proximal and mid common bile duct, largest measuring 6 mm, highly suspicious for intraductal stones. Liver: No focal lesion identified. Within normal limits in parenchymal echogenicity. IVC: No  abnormality visualized. Pancreas: Visualized portion unremarkable. Spleen: Size and appearance within normal limits. Right Kidney: Length: 11.6 cm. Echogenicity within normal limits. No mass or hydronephrosis visualized. Left Kidney: Length: 10.5 cm. Echogenicity within normal limits. No mass or hydronephrosis visualized. Abdominal aorta: No aneurysm visualized. Other findings: None. IMPRESSION: 1. Probable stones within the dilated CBD, largest measuring 6 mm, with bile duct dilatation up to 13 mm diameter. Would consider confirmation with MRCP or ERCP. 2. Sludge within the gallbladder. No gallstones seen. No evidence of cholecystitis. Electronically Signed   By: Franki Cabot M.D.   On: 12/07/2016 20:22   Dg Ercp With Sphincterotomy  Result Date: 12/08/2016 CLINICAL DATA:  Biliary obstruction, choledocholithiasis and history of prior common bile duct stent placement. EXAM: ERCP TECHNIQUE: Multiple spot images obtained with the fluoroscopic device and submitted for interpretation post-procedure. COMPARISON:  Ultrasound on 12/07/2016 and CT of the abdomen on 11/22/2016. Prior ERCP on 09/06/2015. FINDINGS: After cannulation of the common bile duct, contrast injection demonstrates multiple filling defects in the common bile duct. A balloon sweep maneuver was performed. A covered metallic self expanding stent was then placed spanning across the mid and distal CBD into the  duodenum. IMPRESSION: Filling defects in the common bile duct consistent with choledocholithiasis. After a balloon sweep maneuver, a new covered metallic stent was placed in the common bile duct. These images were submitted for radiologic interpretation only. Please see the procedural report for the amount of contrast and the fluoroscopy time utilized. Electronically Signed   By: Aletta Edouard M.D.   On: 12/08/2016 14:03    Scheduled Meds: . calcium carbonate  500 mg of elemental calcium Oral Q breakfast  . ciprofloxacin  200 mg Intravenous Q12H  . enoxaparin (LOVENOX) injection  40 mg Subcutaneous Q24H  . feeding supplement (ENSURE ENLIVE)  237 mL Oral BID BM  . folic acid  1 mg Oral Daily  . metronidazole  500 mg Intravenous Q12H  . mirabegron ER  50 mg Oral Daily  . multivitamin  1 tablet Oral Daily  . multivitamin with minerals  1 tablet Oral Daily  . pantoprazole  40 mg Oral Daily  . polyvinyl alcohol  1 drop Both Eyes BID  . sodium chloride flush  3 mL Intravenous Q12H  . thiamine  100 mg Oral Daily   Continuous Infusions:    LOS: 3 days    Time spent: 20 minutes    Faye Ramsay, MD [facility] Pager [phone]  If 7PM-7AM, please contact night-coverage [email or web address] Password [email or web address] 12/09/2016, 1:02 PM

## 2016-12-10 DIAGNOSIS — E43 Unspecified severe protein-calorie malnutrition: Secondary | ICD-10-CM

## 2016-12-10 LAB — COMPREHENSIVE METABOLIC PANEL
ALBUMIN: 2 g/dL — AB (ref 3.5–5.0)
ALT: 113 U/L — ABNORMAL HIGH (ref 17–63)
AST: 198 U/L — ABNORMAL HIGH (ref 15–41)
Alkaline Phosphatase: 819 U/L — ABNORMAL HIGH (ref 38–126)
Anion gap: 8 (ref 5–15)
BILIRUBIN TOTAL: 14.9 mg/dL — AB (ref 0.3–1.2)
BUN: 9 mg/dL (ref 6–20)
CHLORIDE: 103 mmol/L (ref 101–111)
CO2: 24 mmol/L (ref 22–32)
CREATININE: 1.42 mg/dL — AB (ref 0.61–1.24)
Calcium: 8.2 mg/dL — ABNORMAL LOW (ref 8.9–10.3)
GFR calc Af Amer: 50 mL/min — ABNORMAL LOW (ref >60)
GFR calc non Af Amer: 43 mL/min — ABNORMAL LOW (ref >60)
GLUCOSE: 156 mg/dL — AB (ref 65–99)
POTASSIUM: 3.6 mmol/L (ref 3.5–5.1)
Sodium: 135 mmol/L (ref 135–145)
TOTAL PROTEIN: 4.9 g/dL — AB (ref 6.5–8.1)

## 2016-12-10 LAB — CBC
HEMATOCRIT: 23.5 % — AB (ref 39.0–52.0)
Hemoglobin: 8.1 g/dL — ABNORMAL LOW (ref 13.0–17.0)
MCH: 32.9 pg (ref 26.0–34.0)
MCHC: 34.5 g/dL (ref 30.0–36.0)
MCV: 95.5 fL (ref 78.0–100.0)
PLATELETS: 271 10*3/uL (ref 150–400)
RBC: 2.46 MIL/uL — ABNORMAL LOW (ref 4.22–5.81)
RDW: 14.7 % (ref 11.5–15.5)
WBC: 7.7 10*3/uL (ref 4.0–10.5)

## 2016-12-10 MED ORDER — MAGNESIUM SULFATE 2 GM/50ML IV SOLN
2.0000 g | Freq: Once | INTRAVENOUS | Status: AC
  Administered 2016-12-10: 2 g via INTRAVENOUS
  Filled 2016-12-10: qty 50

## 2016-12-10 MED ORDER — CIPROFLOXACIN IN D5W 400 MG/200ML IV SOLN
400.0000 mg | Freq: Two times a day (BID) | INTRAVENOUS | Status: DC
  Administered 2016-12-10 – 2016-12-11 (×2): 400 mg via INTRAVENOUS
  Filled 2016-12-10 (×2): qty 200

## 2016-12-10 NOTE — Progress Notes (Addendum)
Patient ID: [name] [name], male   DOB: 1929-06-15, 83 y.o.   MRN: [id number]    PROGRESS NOTE  [name] [name]  [name] DOB: 27-Apr-1929 DOA: 12/06/2016  PCP: [name] Patricia, MD   Brief Narrative:  83 y.o.gentleman with a history of HTN, HLD, CKD 3, chronic anemia, GERD, and biliary obstruction requiring CBD stent in November 2016 recently hospitalized for sepsis, E. Coli bacteremia, prostate cancer, on last admission was found to have transaminitis and hyperbilirubinemia and these enzymes trended down during the hospital stay and pt was discharge with intent to follow up with Dr. Paulita [name]. Dr. Paulita [name] requested now direct admission as blood work was notable for rise in Cr to 2.03 (from 1.3 on recent discharge), bilirubin also noted to be 19 (was normal about 10 days ago). Pt reports noticing progressively worsening jaundice in the past week and occasional epigastric pain, dull and intermittent with no specific alleviating or aggravating factors, occasionally associated with nausea and per daughter and wife at bedside also some confusion. Dr. Paulita [name] plans to perform ERCP on Monday. Pt currently denies chest pain or dyspnea, no fevers, chills, and currently no abd pain.   Assessment & Plan:     Jaundice, transaminitis, acute cholangitis, gallstones, pancreatic mass with elevated Ca 19-9 - in the setting of known pancreatic mass 11/22/2016, 2.6 x 3.4 cm in size - also on the same CT scan, interval development of diffuse dilatation of the main pancreatic duct - pt is s/p ERCP which was done 3/4, stent occlusion with stones/debris/clots - stent was removed and replaced 3/4, drainage appeared good at the end of the procedure per Dr. Paulita [name]  - will need to continue to trend LFT's as still elevated, bilirubin has actually trended down  - will continue Cipro and flagyl day #5 - per Dr. Paulita [name], pt may need repeat ERCP depending in clinical progress  - pancreatic mass is highly worrisome for  adenocarcinoma, no tissue diagnosis available at this time  - if cytology report negative, pt will need EGD/EUS but per Dr. Paulita [name], would wait a couple weeks to ensure his acute illness has significantly improved.    AKI imposed on CKD stage III  - reported Cr 2.03 prior to this admission  - appears to be pre renal - IVF have been provided and Cr is trending down  - BMP in AM    Anemia of chronic disease - ? Malignancy related - no signs of active bleeding  - CBC in AM    Skin injury, Bilat buttocks surrounding rectum are red and macerated, not a pressure injury  - affected area is approx 5X5cm; appearance is consistent with moisture associated skin damage - this is also the same appearance to the posterior area of his scrotum and inner groin - Dressing procedure/placement/frequency: Barrier cream to protect skin and repel moisture - Air mattress ordered to decrease pressure and increase airflow to the affected areas.   - appreciate wound care team following     Severe PCM - in the context of acute Illness, evidenced by loss of >5% bw in <1 month, oral intake of </= 50% of needs for >/= 5 days    Hyponatremia - also pre renal, now resolved  - continue to monitor    Hypokalemia - supplemented and WNL this AM - Mg is low so will supplement and repeat Mg level in AM  DVT prophylaxis: Lovenox SQ Code Status: Full  Family Communication: Patient and family at bedside  Disposition Plan: Home  once GI team clears, possibly in 1-2 days once LFT's trending down   Consultants:   GI  Procedures:   ERCP 3/4 -->   Antimicrobials:   Cipro 3/2 -->  Flagyl 3/2 -->  Subjective: No events overnight.   Objective: Vitals:   12/09/16 1403 12/09/16 2245 12/10/16 0500 12/10/16 1250  BP: (!) 110/58 (!) 116/59 129/60 131/62  Pulse: 72 90 84 81  Resp: 18 19 18 18   Temp: 97.6 F (36.4 C) 98.1 F (36.7 C) 98.2 F (36.8 C) 99.5 F (37.5 C)  TempSrc: Oral Oral Oral Oral  SpO2:  100% 99% 98% 98%  Weight:      Height:        Intake/Output Summary (Last 24 hours) at 12/10/16 1400 Last data filed at 12/10/16 1254  Gross per 24 hour  Intake              800 ml  Output             1000 ml  Net             -200 ml   Filed Weights   12/06/16 1708  Weight: 70.3 kg (155 lb)    Examination:  General exam: Appears calm and comfortable, jaundiced  Respiratory system: Clear to auscultation. Respiratory effort normal. Cardiovascular system: No JVD, murmurs, rubs, gallops or clicks. No pedal edema. Gastrointestinal system: No organomegaly or masses felt. Normal bowel sounds heard. Central nervous system: Alert and oriented. No focal neurological deficits.  Data Reviewed: I have personally reviewed following labs and imaging studies  CBC:  Recent Labs Lab 12/06/16 1900 12/07/16 0436 12/08/16 0516 12/09/16 0455 12/10/16 0412  WBC 12.5* 10.6* 8.7 7.3 7.7  HGB 9.8* 8.9* 8.7* 8.0* 8.1*  HCT 29.2* 26.2* 25.3* 23.3* 23.5*  MCV 94.5 93.9 93.7 95.9 95.5  PLT 342 326 287 264 [id number]   Basic Metabolic Panel:  Recent Labs Lab 12/06/16 1900 12/07/16 0436 12/07/16 1738 12/08/16 0516 12/09/16 0455 12/10/16 0412  NA 134* 134*  --  134* 133* 135  K 3.1* 2.8* 3.6 3.3* 3.3* 3.6  CL 96* 100*  --  103 104 103  CO2 24 23  --  24 24 24   GLUCOSE 102* 95  --  138* 124* 156*  BUN 15 14  --  11 11 9   CREATININE 1.95* 1.68*  --  1.53* 1.48* 1.42*  CALCIUM 8.7* 8.2*  --  8.1* 7.8* 8.2*  MG 1.7  --   --  1.4*  --   --   PHOS 3.4  --   --   --   --   --    Liver Function Tests:  Recent Labs Lab 12/06/16 1900 12/07/16 0436 12/08/16 0516 12/09/16 0455 12/10/16 0412  AST 229* 209* 225* 186* 198*  ALT 132* 119* 121* 116* 113*  ALKPHOS 658* 698* 858* 751* 819*  BILITOT 19.1* 19.1* 18.9* 20.3* 14.9*  PROT 6.0* 5.4* 5.0* 5.2* 4.9*  ALBUMIN 2.7* 2.2* 2.1* 2.0* 2.0*    Recent Labs Lab 12/07/16 0436  LIPASE 11   Urine analysis:    Component Value Date/Time    COLORURINE YELLOW 11/22/2016 2052   APPEARANCEUR CLEAR 11/22/2016 2052   LABSPEC 1.013 11/22/2016 2052   PHURINE 5.0 11/22/2016 2052   GLUCOSEU NEGATIVE 11/22/2016 2052   HGBUR LARGE (A) 11/22/2016 2052   BILIRUBINUR NEGATIVE 11/22/2016 2052   [place] NEGATIVE 11/22/2016 2052   PROTEINUR 30 (A) 11/22/2016 2052   NITRITE NEGATIVE 11/22/2016 2052  LEUKOCYTESUR NEGATIVE 11/22/2016 2052   Radiology Studies: No results found.  Scheduled Meds: . calcium carbonate  500 mg of elemental calcium Oral Q breakfast  . ciprofloxacin  200 mg Intravenous Q12H  . enoxaparin (LOVENOX) injection  40 mg Subcutaneous Q24H  . feeding supplement (ENSURE ENLIVE)  237 mL Oral BID BM  . folic acid  1 mg Oral Daily  . metronidazole  500 mg Intravenous Q12H  . mirabegron ER  50 mg Oral Daily  . multivitamin  1 tablet Oral Daily  . multivitamin with minerals  1 tablet Oral Daily  . pantoprazole  40 mg Oral Daily  . polyvinyl alcohol  1 drop Both Eyes BID  . sodium chloride flush  3 mL Intravenous Q12H  . thiamine  100 mg Oral Daily   Continuous Infusions:   LOS: 4 days   Time spent: 20 minutes    Faye Ramsay, MD [facility] Pager [phone]  If 7PM-7AM, please contact night-coverage [email or web address] Password [email or web address] 12/10/2016, 2:00 PM

## 2016-12-10 NOTE — Progress Notes (Signed)
Subjective: Poor appetite. No abdominal pain.  Objective: Vital signs in last 24 hours: Temp:  [97.6 F (36.4 C)-98.2 F (36.8 C)] 98.2 F (36.8 C) (03/06 0500) Pulse Rate:  [72-90] 84 (03/06 0500) Resp:  [18-19] 18 (03/06 0500) BP: (110-129)/(58-60) 129/60 (03/06 0500) SpO2:  [98 %-100 %] 98 % (03/06 0500) Weight change:  Last BM Date: 12/09/16  PE: GEN:  Less jaundiced-appearing ABD:  Soft, non-tender  Lab Results: CBC    Component Value Date/Time   WBC 7.7 12/10/2016 0412   RBC 2.46 (L) 12/10/2016 0412   HGB 8.1 (L) 12/10/2016 0412   HCT 23.5 (L) 12/10/2016 0412   PLT 271 12/10/2016 0412   MCV 95.5 12/10/2016 0412   MCH 32.9 12/10/2016 0412   MCHC 34.5 12/10/2016 0412   RDW 14.7 12/10/2016 0412   LYMPHSABS 1.1 11/25/2016 0510   MONOABS 0.7 11/25/2016 0510   EOSABS 0.1 11/25/2016 0510   BASOSABS 0.1 11/25/2016 0510   CMP     Component Value Date/Time   NA 135 12/10/2016 0412   K 3.6 12/10/2016 0412   CL 103 12/10/2016 0412   CO2 24 12/10/2016 0412   GLUCOSE 156 (H) 12/10/2016 0412   BUN 9 12/10/2016 0412   CREATININE 1.42 (H) 12/10/2016 0412   CALCIUM 8.2 (L) 12/10/2016 0412   PROT 4.9 (L) 12/10/2016 0412   ALBUMIN 2.0 (L) 12/10/2016 0412   AST 198 (H) 12/10/2016 0412   ALT 113 (H) 12/10/2016 0412   ALKPHOS 819 (H) 12/10/2016 0412   BILITOT 14.9 (H) 12/10/2016 0412   GFRNONAA 43 (L) 12/10/2016 0412   GFRAA 50 (L) 12/10/2016 0412   Assessment:  1.  Cholangitis, on antibiotics. 2.  Elevated LFTs, now down-trending. 3.  Acute renal failure, improving. 4.  Pancreatic mass.  Plan:  1.  Continue antibiotics. 2.  Would like at least one more day to assure LFTs continue to decrease. 3.  Soft diet ok. 4.  Awaiting stent cytology; if no cancer noted, will need another procedure (EGD to biopsy ampulla +/- EUS with pancreatic biopsy), but would wait a couple weeks to ensure his acute illness has significantly improved. 5.  [facility] will  follow.   Landry Dyke 12/10/2016, 7:39 AM   Pager [phone] If no answer or after 5 PM call [phone]

## 2016-12-10 NOTE — Progress Notes (Signed)
PHARMACY NOTE:  ANTIMICROBIAL RENAL DOSAGE ADJUSTMENT  Current antimicrobial regimen includes a mismatch between antimicrobial dosage and estimated renal function.  As per policy approved by the Pharmacy & Therapeutics and Medical Executive Committees, the antimicrobial dosage will be adjusted accordingly.  Current antimicrobial dosage:  Cipro 200 mg IV q12h  Indication: intra-abdominal infection  Renal Function:  Estimated Creatinine Clearance: 37.8 mL/min (by C-G formula based on SCr of 1.42 mg/dL (H)). []      On intermittent HD, scheduled: []      On CRRT    Antimicrobial dosage has been changed to:  Cipro 400 mg IV q12h  Additional comments:    Thank you for allowing pharmacy to be a part of this patient's care.  Renold Genta, PharmD, BCPS Clinical Pharmacist Phone for today - [place] - [phone] 12/10/2016 5:21 PM

## 2016-12-10 NOTE — Care Management Important Message (Signed)
Important Message  Patient Details  Name: [name] [name] MRN: [id number] Date of Birth: [birth year]   Medicare Important Message Given:  Yes    Colton Engdahl Abena 12/10/2016, 11:19 AM

## 2016-12-11 ENCOUNTER — Ambulatory Visit (HOSPITAL_COMMUNITY): Admission: RE | Admit: 2016-12-11 | Payer: Medicare Other | Source: Ambulatory Visit | Admitting: Gastroenterology

## 2016-12-11 ENCOUNTER — Encounter (HOSPITAL_COMMUNITY): Admission: RE | Payer: Self-pay | Source: Ambulatory Visit

## 2016-12-11 DIAGNOSIS — D649 Anemia, unspecified: Secondary | ICD-10-CM

## 2016-12-11 DIAGNOSIS — N179 Acute kidney failure, unspecified: Secondary | ICD-10-CM

## 2016-12-11 DIAGNOSIS — L8990 Pressure ulcer of unspecified site, unspecified stage: Secondary | ICD-10-CM

## 2016-12-11 DIAGNOSIS — K830 Cholangitis: Secondary | ICD-10-CM

## 2016-12-11 LAB — COMPREHENSIVE METABOLIC PANEL
ALBUMIN: 1.9 g/dL — AB (ref 3.5–5.0)
ALK PHOS: 742 U/L — AB (ref 38–126)
ALT: 102 U/L — ABNORMAL HIGH (ref 17–63)
ANION GAP: 11 (ref 5–15)
AST: 161 U/L — ABNORMAL HIGH (ref 15–41)
BUN: 10 mg/dL (ref 6–20)
CALCIUM: 8.1 mg/dL — AB (ref 8.9–10.3)
CO2: 24 mmol/L (ref 22–32)
Chloride: 94 mmol/L — ABNORMAL LOW (ref 101–111)
Creatinine, Ser: 1.44 mg/dL — ABNORMAL HIGH (ref 0.61–1.24)
GFR calc Af Amer: 50 mL/min — ABNORMAL LOW (ref >60)
GFR calc non Af Amer: 43 mL/min — ABNORMAL LOW (ref >60)
GLUCOSE: 112 mg/dL — AB (ref 65–99)
Potassium: 3.4 mmol/L — ABNORMAL LOW (ref 3.5–5.1)
SODIUM: 129 mmol/L — AB (ref 135–145)
Total Bilirubin: 9.9 mg/dL — ABNORMAL HIGH (ref 0.3–1.2)
Total Protein: 4.8 g/dL — ABNORMAL LOW (ref 6.5–8.1)

## 2016-12-11 LAB — CBC
HEMATOCRIT: 22.5 % — AB (ref 39.0–52.0)
HEMOGLOBIN: 7.7 g/dL — AB (ref 13.0–17.0)
MCH: 33.2 pg (ref 26.0–34.0)
MCHC: 34.2 g/dL (ref 30.0–36.0)
MCV: 97 fL (ref 78.0–100.0)
Platelets: 259 10*3/uL (ref 150–400)
RBC: 2.32 MIL/uL — ABNORMAL LOW (ref 4.22–5.81)
RDW: 14.7 % (ref 11.5–15.5)
WBC: 7.1 10*3/uL (ref 4.0–10.5)

## 2016-12-11 LAB — MAGNESIUM: Magnesium: 1.7 mg/dL (ref 1.7–2.4)

## 2016-12-11 SURGERY — ESOPHAGEAL ENDOSCOPIC ULTRASOUND (EUS) RADIAL
Anesthesia: General

## 2016-12-11 MED ORDER — METRONIDAZOLE 500 MG PO TABS
500.0000 mg | ORAL_TABLET | Freq: Two times a day (BID) | ORAL | Status: DC
  Administered 2016-12-11 – 2016-12-13 (×5): 500 mg via ORAL
  Filled 2016-12-11 (×5): qty 1

## 2016-12-11 MED ORDER — POTASSIUM CHLORIDE CRYS ER 20 MEQ PO TBCR
40.0000 meq | EXTENDED_RELEASE_TABLET | Freq: Once | ORAL | Status: AC
  Administered 2016-12-11: 40 meq via ORAL
  Filled 2016-12-11: qty 2

## 2016-12-11 MED ORDER — CIPROFLOXACIN HCL 500 MG PO TABS
500.0000 mg | ORAL_TABLET | Freq: Two times a day (BID) | ORAL | Status: DC
  Administered 2016-12-11 – 2016-12-13 (×4): 500 mg via ORAL
  Filled 2016-12-11 (×4): qty 1

## 2016-12-11 MED ORDER — MAGNESIUM SULFATE 2 GM/50ML IV SOLN
2.0000 g | Freq: Once | INTRAVENOUS | Status: AC
Start: 2016-12-11 — End: 2016-12-11
  Administered 2016-12-11: 2 g via INTRAVENOUS
  Filled 2016-12-11 (×2): qty 50

## 2016-12-11 NOTE — Progress Notes (Signed)
Physical Therapy Treatment Patient Details Name: LAEL PILCH MRN: [id number] DOB: [birth year] Today's Date: 12/11/2016    History of Present Illness pt presents with Jaundice and is now s/p ERCP with Bile Dcut Stone Removed, and Bile Duct Stent Replaced.  pt also with Transaminitis and Pancreatic Mass.  pt with hx of HTN, CKD, Prostate CA, Shingles, and Chronic Renal Insfficiency.      PT Comments    Pt performed gait and transfer.  Pt remains slow and requires min to mod assist.  Pt will need around the clock assistance to maintain safety.  Plan next session to continue gait and functional training to decrease care giver burden and improve function.      Follow Up Recommendations  Home health PT;Supervision/Assistance - 24 hour     Equipment Recommendations  None recommended by PT    Recommendations for Other Services       Precautions / Restrictions Precautions Precautions: Fall Restrictions Weight Bearing Restrictions: No    Mobility  Bed Mobility Overal bed mobility: Needs Assistance Bed Mobility: Supine to Sit;Sit to Supine     Supine to sit: Min guard Sit to supine: Min assist   General bed mobility comments: Pt required assistance to lift LEs into bed.    Transfers Overall transfer level: Needs assistance Equipment used: Rolling walker (2 wheeled) Transfers: Sit to/from Stand Sit to Stand: Min assist;Mod assist         General transfer comment: mod assist to power up and min assist to maintain standing.  Cues for hand placement to and from seated surface.    Ambulation/Gait Ambulation/Gait assistance: Min assist Ambulation Distance (Feet): 100 Feet Assistive device: Rolling walker (2 wheeled) Gait Pattern/deviations: Step-through pattern;Decreased stride length;Trunk flexed;Decreased step length - right Gait velocity: decreased Gait velocity interpretation: Below normal speed for age/gender General Gait Details: cues for more upright posture and  staying closer to RW.  Cues to increase stride on R side.     Stairs            Wheelchair Mobility    Modified Rankin (Stroke Patients Only)       Balance Overall balance assessment: Needs assistance   Sitting balance-Leahy Scale: Fair       Standing balance-Leahy Scale: Poor                      Cognition Arousal/Alertness: Awake/alert Behavior During Therapy: WFL for tasks assessed/performed Overall Cognitive Status: Within Functional Limits for tasks assessed                 General Comments: HOH, but cognitively seems intact.    Exercises      General Comments        Pertinent Vitals/Pain Pain Assessment: Faces Faces Pain Scale: Hurts little more Pain Location: buttocks Pain Descriptors / Indicators: Burning Pain Intervention(s): Monitored during session;Repositioned (applied barrier cream.)    Home Living                      Prior Function            PT Goals (current goals can now be found in the care plan section) Acute Rehab PT Goals Patient Stated Goal: go home Potential to Achieve Goals: Good Progress towards PT goals: Progressing toward goals (slow)    Frequency    Min 3X/week      PT Plan Current plan remains appropriate    Co-evaluation  End of Session Equipment Utilized During Treatment: Gait belt Activity Tolerance: Patient tolerated treatment well Patient left: in chair;with call bell/phone within reach;with chair alarm set;with family/visitor present Nurse Communication: Mobility status PT Visit Diagnosis: Unsteadiness on feet (R26.81)     Time: 3009-7949 PT Time Calculation (min) (ACUTE ONLY): 20 min  Charges:  $Gait Training: 8-22 mins                    G Codes:       Cristela Blue 2016/12/19, 5:25 PM Governor Rooks, PTA pager [phone]

## 2016-12-11 NOTE — Progress Notes (Signed)
PROGRESS NOTE    [name] [name]  [id number] DOB: Jul 12, 1929 DOA: 12/06/2016 PCP: [name] Patricia, MD   No chief complaint on file.   Brief Narrative:  HPI on 12/06/2016 by Dr. Mart Piggs 83 y.o.gentleman with a history of HTN, HLD, CKD 3, chronic anemia, GERD, and biliary obstruction requiring CBD stent in November 2016 recently hospitalized for sepsis, E. Coli bacteremia, prostate cancer, on last admission was found to have transaminitis and hyperbilirubinemia and these enzymes trended down during the hospital stay and pt was discharge with intent to follow up with Dr. Paulita [name]. Dr. Paulita [name] requested now direct admission as blood work was notable for rise in Cr to 2.03 (from 1.3 on recent discharge), bilirubin also noted to be 19 (was normal about 10 days ago). Pt reports noticing progressively worsening jaundice in the past week and occasional epigastric pain, dull and intermittent with no specific alleviating or aggravating factors, occasionally associated with nausea and per daughter and wife at bedside also some confusion. Dr. Paulita [name] plans to perform ERCP on Monday. Pt currently denies chest pain or dyspnea, no fevers, chills, and currently no abd pain.  Assessment & Plan   Jaundice, transaminitis, acute cholangitis, gallstones, pancreatic mass with elevated Ca 19-9 -in the setting of known pancreatic mass 11/22/2016, 2.6 x 3.4 cm in size -also on the same CT scan, interval development of diffuse dilatation of the main pancreatic duct -pt is s/p ERCP which was done 3/4, stent occlusion with stones/debris/clots -stent was removed and replaced 3/4, drainage appeared good at the end of the procedure per Dr. Paulita [name]  -LFTS and bilirubin trending downward -Will transition IV cipro and flagyl to oral -will continue Cipro and flagyl day #5 -pancreatic mass is highly worrisome for adenocarcinoma, no tissue diagnosis available at this time  -if cytology report negative, pt will need EGD/EUS but  per Dr. Paulita [name], would wait a couple weeks to ensure his acute illness has significantly improved.  AKI imposed on CKD stage III -reported Cr 2.03 prior to this admission  -appears to be pre renal -Creatinine improving and stable, currently 1.44  Anemia of chronic disease -? Malignancy related -hemoglobin currently 7.7 -no signs of active bleeding  -Continue to monitor CBC  Skin injury, Bilat buttocks surrounding rectum are red and macerated, not a pressure injury  -affected area is approx 5X5cm; appearance is consistent with moisture associated skin damage -this is also the same appearance to the posterior area of his scrotum and inner groin -Dressing procedure/placement/frequency: Barrier cream to protect skin and repel moisture -Air mattress ordered to decrease pressure and increase airflow to the affected areas.  -appreciate wound care team following   Severe PCM -in the context of acute Illness, evidenced by loss of >5% bw in <1 month, oral intake of </= 50% of needs for >/= 5 days  Hyponatremia -also pre renal, now resolved  -continue to monitor  Hypokalemia/hypomagnesemia  -Continue to supplement and monitor   DVT Prophylaxis  Lovenox  Code Status: Full  Family Communication: Daughter at bedside  Disposition Plan: Admitted, pending PT consult and recommendations from GI  Consultants Gastroenterology   Procedures  ERCP  Antibiotics   Anti-infectives    Start     Dose/Rate Route Frequency Ordered Stop   12/10/16 1730  ciprofloxacin (CIPRO) IVPB 400 mg     400 mg 200 mL/hr over 60 Minutes Intravenous Every 12 hours 12/10/16 1719     12/07/16 1600  metroNIDAZOLE (FLAGYL) IVPB 500 mg     500  mg 100 mL/hr over 60 Minutes Intravenous Every 12 hours 12/07/16 1511     12/07/16 1600  ciprofloxacin (CIPRO) IVPB 200 mg  Status:  Discontinued     200 mg 100 mL/hr over 60 Minutes Intravenous Every 12 hours 12/07/16 1511 12/10/16 1719   12/06/16 2200   cefpodoxime (VANTIN) tablet 200 mg  Status:  Discontinued     200 mg Oral Every 12 hours 12/06/16 1818 12/06/16 1948   12/06/16 2200  metroNIDAZOLE (FLAGYL) tablet 500 mg  Status:  Discontinued     500 mg Oral Every 8 hours 12/06/16 1818 12/07/16 1511   12/06/16 2000  ciprofloxacin (CIPRO) tablet 250 mg  Status:  Discontinued     250 mg Oral 2 times daily 12/06/16 1948 12/07/16 1511      Subjective:   [name] [name] seen and examined today.  Patient states he is feeling better today. Denies chest pain, shortness of breath, abdominal pain, N/V/D/C.  Feels like he is ready to go home.   Objective:   Vitals:   12/10/16 1250 12/10/16 2158 12/11/16 0511 12/11/16 1322  BP: 131/62 (!) 118/55 (!) 100/54 (!) 115/53  Pulse: 81 77 81 80  Resp: 18 17 17 18   Temp: 99.5 F (37.5 C) 98 F (36.7 C) 98.8 F (37.1 C) 97.6 F (36.4 C)  TempSrc: Oral Oral Oral Oral  SpO2: 98% 98% 98% 100%  Weight:      Height:        Intake/Output Summary (Last 24 hours) at 12/11/16 1347 Last data filed at 12/11/16 1100  Gross per 24 hour  Intake              375 ml  Output              650 ml  Net             -275 ml   Filed Weights   12/06/16 1708  Weight: 70.3 kg (155 lb)    Exam  General: Well developed, well nourished, NAD, appears stated age  HEENT: NCAT, Sclera icterus, mucous membranes moist.   Cardiovascular: S1 S2 auscultated, no rubs, murmurs or gallops. Regular rate and rhythm.  Respiratory: Clear to auscultation bilaterally with equal chest rise  Abdomen: Soft, nontender, nondistended, + bowel sounds  Extremities: warm dry without cyanosis clubbing or edema  Neuro: AAOx3, nonfocal   Skin: Without rashes exudates or nodules, jaundice  Psych: Normal affect and demeanor with intact judgement and insight   Data Reviewed: I have personally reviewed following labs and imaging studies  CBC:  Recent Labs Lab 12/07/16 0436 12/08/16 0516 12/09/16 0455 12/10/16 0412  12/11/16 0749  WBC 10.6* 8.7 7.3 7.7 7.1  HGB 8.9* 8.7* 8.0* 8.1* 7.7*  HCT 26.2* 25.3* 23.3* 23.5* 22.5*  MCV 93.9 93.7 95.9 95.5 97.0  PLT 326 287 264 271 563   Basic Metabolic Panel:  Recent Labs Lab 12/06/16 1900 12/07/16 0436 12/07/16 1738 12/08/16 0516 12/09/16 0455 12/10/16 0412 12/11/16 0749  NA 134* 134*  --  134* 133* 135 129*  K 3.1* 2.8* 3.6 3.3* 3.3* 3.6 3.4*  CL 96* 100*  --  103 104 103 94*  CO2 24 23  --  24 24 24 24   GLUCOSE 102* 95  --  138* 124* 156* 112*  BUN 15 14  --  11 11 9 10   CREATININE 1.95* 1.68*  --  1.53* 1.48* 1.42* 1.44*  CALCIUM 8.7* 8.2*  --  8.1* 7.8* 8.2* 8.1*  MG 1.7  --   --  1.4*  --   --  1.7  PHOS 3.4  --   --   --   --   --   --    GFR: Estimated Creatinine Clearance: 37.3 mL/min (by C-G formula based on SCr of 1.44 mg/dL (H)). Liver Function Tests:  Recent Labs Lab 12/07/16 0436 12/08/16 0516 12/09/16 0455 12/10/16 0412 12/11/16 0749  AST 209* 225* 186* 198* 161*  ALT 119* 121* 116* 113* 102*  ALKPHOS 698* 858* 751* 819* 742*  BILITOT 19.1* 18.9* 20.3* 14.9* 9.9*  PROT 5.4* 5.0* 5.2* 4.9* 4.8*  ALBUMIN 2.2* 2.1* 2.0* 2.0* 1.9*    Recent Labs Lab 12/07/16 0436  LIPASE 11   No results for input(s): AMMONIA in the last 168 hours. Coagulation Profile: No results for input(s): INR, PROTIME in the last 168 hours. Cardiac Enzymes: No results for input(s): CKTOTAL, CKMB, CKMBINDEX, TROPONINI in the last 168 hours. BNP (last 3 results) No results for input(s): PROBNP in the last 8760 hours. HbA1C: No results for input(s): HGBA1C in the last 72 hours. CBG: No results for input(s): GLUCAP in the last 168 hours. Lipid Profile: No results for input(s): CHOL, HDL, LDLCALC, TRIG, CHOLHDL, LDLDIRECT in the last 72 hours. Thyroid Function Tests: No results for input(s): TSH, T4TOTAL, FREET4, T3FREE, THYROIDAB in the last 72 hours. Anemia Panel: No results for input(s): VITAMINB12, FOLATE, FERRITIN, TIBC, IRON, RETICCTPCT  in the last 72 hours. Urine analysis:    Component Value Date/Time   COLORURINE YELLOW 11/22/2016 2052   APPEARANCEUR CLEAR 11/22/2016 2052   LABSPEC 1.013 11/22/2016 2052   PHURINE 5.0 11/22/2016 2052   GLUCOSEU NEGATIVE 11/22/2016 2052   HGBUR LARGE (A) 11/22/2016 2052   [place] NEGATIVE 11/22/2016 2052   [place] NEGATIVE 11/22/2016 2052   PROTEINUR 30 (A) 11/22/2016 2052   NITRITE NEGATIVE 11/22/2016 2052   LEUKOCYTESUR NEGATIVE 11/22/2016 2052   Sepsis Labs: @LABRCNTIP (procalcitonin:4,lacticidven:4)  )No results found for this or any previous visit (from the past 240 hour(s)).    Radiology Studies: No results found.   Scheduled Meds: . calcium carbonate  500 mg of elemental calcium Oral Q breakfast  . ciprofloxacin  400 mg Intravenous Q12H  . enoxaparin (LOVENOX) injection  40 mg Subcutaneous Q24H  . feeding supplement (ENSURE ENLIVE)  237 mL Oral BID BM  . folic acid  1 mg Oral Daily  . metronidazole  500 mg Intravenous Q12H  . mirabegron ER  50 mg Oral Daily  . multivitamin  1 tablet Oral Daily  . multivitamin with minerals  1 tablet Oral Daily  . pantoprazole  40 mg Oral Daily  . polyvinyl alcohol  1 drop Both Eyes BID  . sodium chloride flush  3 mL Intravenous Q12H  . thiamine  100 mg Oral Daily   Continuous Infusions:   LOS: 5 days   Time Spent in minutes   30 minutes  Kaislyn Gulas D.O. on 12/11/2016 at 1:47 PM  Between 7am to 7pm - Pager - [phone]  After 7pm go to [email or web address] - password [email or web address]  And look for the night coverage person covering for me after hours  [facility] Office  [phone]

## 2016-12-11 NOTE — Progress Notes (Signed)
Nutrition Follow-up  DOCUMENTATION CODES:   Not applicable  INTERVENTION:   -Continue Ensure Enlive po BID, each supplement provides 350 kcal and 20 grams of protein -Continue MVI daily  NUTRITION DIAGNOSIS:   Inadequate oral intake related to poor appetite as evidenced by meal completion < 50%.  Progressing  GOAL:   Patient will meet greater than or equal to 90% of their needs  Progressing  MONITOR:   PO intake, Supplement acceptance, Diet advancement, Labs  REASON FOR ASSESSMENT:   Malnutrition Screening Tool    ASSESSMENT:   83 y.o. gentleman with a history of HTN, HLD, CKD 3, chronic anemia, GERD, and biliary obstruction requiring CBD stent in November 2016 recently hospitalized for sepsis, E. Coli bacteremia, prostate cancer, on last admission was found to have transaminitis and hyperbilirubinemia and these enzymes trended down during the hospital stay and pt was discharge with intent to follow up with Dr. Paulita Fujita. Dr. Paulita Fujita requested now direct admission as blood work was notable for rise in Cr to 2.03 (from 1.3 on recent discharge), bilirubin also noted to be 19 (was normal about 10 days ago). Pt reports noticing progressively worsening jaundice in the past week   3/4- s/p ERCP, which revealed cholangitis from migrated and occluded biliary wall stent 3/5- advanced to soft diet  Spoke with pt and daughter at bedside, who report appetite is improving. Meal completion 35% per doc flowsheets. Pt daughter shares that pt has been experiencing early satiety, however, consumed about 50% of breakfast this morning, which is an improvement over the past few days. Pt shares he enjoys the Ensure supplements and has been consuming 1-2 daily without difficulty. Pt daughter shares that she plans to purchase them when pt returns home; discussed options for supplements for purchase in the retails settings.   Reinforced importance of good meal and supplement intake to promote healing. Pt  and daughter express appreciation for visit.   Labs reviewed: Na: 129, K: 3.4.   Diet Order:  DIET SOFT Room service appropriate? Yes; Fluid consistency: Thin  Skin:  Wound (see comment) (MASD on bilateral buttocks surrounding rectum)  Last BM:  12/11/16  Height:   Ht Readings from Last 1 Encounters:  12/06/16 5\' 9"  (1.753 m)    Weight:   Wt Readings from Last 1 Encounters:  12/06/16 155 lb (70.3 kg)    Ideal Body Weight:  72.73 kg  BMI:  Body mass index is 22.89 kg/m.  Estimated Nutritional Needs:   Kcal:  1900-2100 kcals (27-30 kcal/kg bw)  Protein:  85-100 g Pro (1.2-1.4 g/kg bw)  Fluid:  1.9-2.1 L fluid  EDUCATION NEEDS:   Education needs no appropriate at this time  Marlette Curvin A. Jimmye Norman, RD, LDN, CDE Pager: [phone] After hours Pager: [phone]

## 2016-12-11 NOTE — Progress Notes (Signed)
Subjective: Poor energy, poor appetite. No abdominal pain. No blood in stool.  Objective: Vital signs in last 24 hours: Temp:  [97.6 F (36.4 C)-98.8 F (37.1 C)] 97.6 F (36.4 C) (03/07 1322) Pulse Rate:  [77-81] 80 (03/07 1322) Resp:  [17-18] 18 (03/07 1322) BP: (100-118)/(53-55) 115/53 (03/07 1322) SpO2:  [98 %-100 %] 100 % (03/07 1322) Weight change:  Last BM Date: 12/11/16  PE: GEN:  Less jaundiced-appearing, NAD  Lab Results: CBC    Component Value Date/Time   WBC 7.1 12/11/2016 0749   RBC 2.32 (L) 12/11/2016 0749   HGB 7.7 (L) 12/11/2016 0749   HCT 22.5 (L) 12/11/2016 0749   PLT 259 12/11/2016 0749   MCV 97.0 12/11/2016 0749   MCH 33.2 12/11/2016 0749   MCHC 34.2 12/11/2016 0749   RDW 14.7 12/11/2016 0749   LYMPHSABS 1.1 11/25/2016 0510   MONOABS 0.7 11/25/2016 0510   EOSABS 0.1 11/25/2016 0510   BASOSABS 0.1 11/25/2016 0510   CMP     Component Value Date/Time   NA 129 (L) 12/11/2016 0749   K 3.4 (L) 12/11/2016 0749   CL 94 (L) 12/11/2016 0749   CO2 24 12/11/2016 0749   GLUCOSE 112 (H) 12/11/2016 0749   BUN 10 12/11/2016 0749   CREATININE 1.44 (H) 12/11/2016 0749   CALCIUM 8.1 (L) 12/11/2016 0749   PROT 4.8 (L) 12/11/2016 0749   ALBUMIN 1.9 (L) 12/11/2016 0749   AST 161 (H) 12/11/2016 0749   ALT 102 (H) 12/11/2016 0749   ALKPHOS 742 (H) 12/11/2016 0749   BILITOT 9.9 (H) 12/11/2016 0749   GFRNONAA 43 (L) 12/11/2016 0749   GFRAA 50 (L) 12/11/2016 0749   Assessment:  1.  Cholangitis, on antibiotics. 2.  Elevated LFTs, down-trending. 3.  Acute renal failure, improving. 4.  Pancreatic mass.  Stent cytology showed atypical cells otherwise unrevealing. 5.  Anemia, slow down-trend.  Hgb has fluctuated in past, baseline 9-10 over the years.  No overt bleeding.  Plan:  1.  Soft diet. 2.  Transition to oral antibiotics. 3.  PT/OT evaluation. 4.  Follow CBC trend; possible transfusion if Hgb drops much lower.  Would rather not do endoscopic  evaluation given his recent significant illness, unless patient has overt destabilizing bleeding (which he currently does not). 5.  Possibly home in the next day or two. 6.  Case discussed with Dr. Ree Kida, as well as patient and his daughter Lattie Haw. 7.  [facility] will follow.   Landry Dyke 12/11/2016, 3:26 PM   Pager [phone] If no answer or after 5 PM call [phone]

## 2016-12-12 DIAGNOSIS — R5381 Other malaise: Secondary | ICD-10-CM

## 2016-12-12 LAB — COMPREHENSIVE METABOLIC PANEL
ALT: 96 U/L — ABNORMAL HIGH (ref 17–63)
ANION GAP: 6 (ref 5–15)
AST: 137 U/L — ABNORMAL HIGH (ref 15–41)
Albumin: 2 g/dL — ABNORMAL LOW (ref 3.5–5.0)
Alkaline Phosphatase: 795 U/L — ABNORMAL HIGH (ref 38–126)
BILIRUBIN TOTAL: 9.2 mg/dL — AB (ref 0.3–1.2)
BUN: 9 mg/dL (ref 6–20)
CALCIUM: 8.1 mg/dL — AB (ref 8.9–10.3)
CO2: 27 mmol/L (ref 22–32)
Chloride: 101 mmol/L (ref 101–111)
Creatinine, Ser: 1.56 mg/dL — ABNORMAL HIGH (ref 0.61–1.24)
GFR, EST AFRICAN AMERICAN: 45 mL/min — AB (ref >60)
GFR, EST NON AFRICAN AMERICAN: 39 mL/min — AB (ref >60)
GLUCOSE: 106 mg/dL — AB (ref 65–99)
Potassium: 3.9 mmol/L (ref 3.5–5.1)
Sodium: 134 mmol/L — ABNORMAL LOW (ref 135–145)
TOTAL PROTEIN: 5.5 g/dL — AB (ref 6.5–8.1)

## 2016-12-12 LAB — CBC
HEMATOCRIT: 23.4 % — AB (ref 39.0–52.0)
Hemoglobin: 7.9 g/dL — ABNORMAL LOW (ref 13.0–17.0)
MCH: 32.9 pg (ref 26.0–34.0)
MCHC: 33.8 g/dL (ref 30.0–36.0)
MCV: 97.5 fL (ref 78.0–100.0)
Platelets: 273 10*3/uL (ref 150–400)
RBC: 2.4 MIL/uL — ABNORMAL LOW (ref 4.22–5.81)
RDW: 15.1 % (ref 11.5–15.5)
WBC: 8.1 10*3/uL (ref 4.0–10.5)

## 2016-12-12 LAB — MAGNESIUM: MAGNESIUM: 2.1 mg/dL (ref 1.7–2.4)

## 2016-12-12 NOTE — Care Management Note (Addendum)
Case Management Note  Patient Details  Name: [name] [name] MRN: [id number] Date of Birth: 1929/06/18  Subjective/Objective:                  Admitted withJaundice, transaminitis, acute cholangitis, gallstones, pancreatic mass with elevated Ca 19-9, hx of HTN, HLD, CKD 3, chronic anemia, GERD, and biliary obstruction requiring CBD stent in November 2016 , sepsis, E. Coli bacteremia, prostate cancer. From home with wife.   Daughter, Lattie Haw - [phone]  PCP: Legrand Como [place]  Action/Plan: Plan is to d/c today with home health services . Daughter to transport pt to home.  Expected Discharge Date:    12/12/2016            Expected Discharge Plan:  [place]  In-House Referral:  NA  Discharge planning Services  CM Consult  Post Acute Care Choice:   Choice offered to: Patient  DME Arranged:     DME Agency:     HH Arranged:  RN,PT,NA [place] Agency: [facility] / referral made to [facility] @ [phone] per CM.  Status of Service:  completed  If discussed at Long Length of Stay Meetings, dates discussed:    Additional Comments:  Sharin Mons, RN 12/12/2016, 10:55 AM

## 2016-12-12 NOTE — Progress Notes (Signed)
PROGRESS NOTE    [name] [name]  OFB:[id number] DOB: 09-02-1929 DOA: 12/06/2016 PCP: Limmie Patricia, MD   No chief complaint on file.   Brief Narrative:  HPI on 12/06/2016 by Dr. Mart Piggs 83 y.o.gentleman with a history of HTN, HLD, CKD 3, chronic anemia, GERD, and biliary obstruction requiring CBD stent in November 2016 recently hospitalized for sepsis, E. Coli bacteremia, prostate cancer, on last admission was found to have transaminitis and hyperbilirubinemia and these enzymes trended down during the hospital stay and pt was discharge with intent to follow up with Dr. Paulita Fujita. Dr. Paulita Fujita requested now direct admission as blood work was notable for rise in Cr to 2.03 (from 1.3 on recent discharge), bilirubin also noted to be 19 (was normal about 10 days ago). Pt reports noticing progressively worsening jaundice in the past week and occasional epigastric pain, dull and intermittent with no specific alleviating or aggravating factors, occasionally associated with nausea and per daughter and wife at bedside also some confusion. Dr. Paulita Fujita plans to perform ERCP on Monday. Pt currently denies chest pain or dyspnea, no fevers, chills, and currently no abd pain.  Assessment & Plan   Jaundice, transaminitis, acute cholangitis, gallstones, pancreatic mass with elevated Ca 19-9 -in the setting of known pancreatic mass 11/22/2016, 2.6 x 3.4 cm in size -also on the same CT scan, interval development of diffuse dilatation of the main pancreatic duct -pt is s/p ERCP which was done 3/4, stent occlusion with stones/debris/clots -stent was removed and replaced 3/4, drainage appeared good at the end of the procedure per Dr. Paulita Fujita  -LFTS and bilirubin trending downward -Will transition IV cipro and flagyl to oral -will continue Cipro and flagyl day #5 -pancreatic mass is highly worrisome for adenocarcinoma, no tissue diagnosis available at this time  -cytology report negative, repeat endoscopic eval on  hold given is current illness and patient is improving  AKI imposed on CKD stage III -reported Cr 2.03 prior to this admission  -appears to be pre renal -Creatinine improving and stable, currently 1.56  Anemia of chronic disease -? Malignancy related -hemoglobin currently 7.9 -no signs of active bleeding  -Continue to monitor CBC  Skin injury, Bilat buttocks surrounding rectum are red and macerated, not a pressure injury  -affected area is approx 5X5cm; appearance is consistent with moisture associated skin damage -this is also the same appearance to the posterior area of his scrotum and inner groin -Dressing procedure/placement/frequency: Barrier cream to protect skin and repel moisture -Air mattress ordered to decrease pressure and increase airflow to the affected areas.  -appreciate wound care team following   Severe PCM -in the context of acute Illness, evidenced by loss of >5% bw in <1 month, oral intake of </= 50% of needs for >/= 5 days  Hyponatremia -also pre renal -continue to monitor  Deconditioning -PT consulted and recommended HH  Hypokalemia/hypomagnesemia  -Resolved, Continue to supplement and monitor   DVT Prophylaxis  Lovenox  Code Status: Full  Family Communication: Daughter and wife at bedside  Disposition Plan: Admitted, Likely discharge 3/9  Consultants Gastroenterology   Procedures  ERCP  Antibiotics   Anti-infectives    Start     Dose/Rate Route Frequency Ordered Stop   12/11/16 2000  ciprofloxacin (CIPRO) tablet 500 mg     500 mg Oral 2 times daily 12/11/16 1355     12/11/16 1400  metroNIDAZOLE (FLAGYL) tablet 500 mg     500 mg Oral Every 12 hours 12/11/16 1355  12/10/16 1730  ciprofloxacin (CIPRO) IVPB 400 mg  Status:  Discontinued     400 mg 200 mL/hr over 60 Minutes Intravenous Every 12 hours 12/10/16 1719 12/11/16 1355   12/07/16 1600  metroNIDAZOLE (FLAGYL) IVPB 500 mg  Status:  Discontinued     500 mg 100 mL/hr over 60  Minutes Intravenous Every 12 hours 12/07/16 1511 12/11/16 1355   12/07/16 1600  ciprofloxacin (CIPRO) IVPB 200 mg  Status:  Discontinued     200 mg 100 mL/hr over 60 Minutes Intravenous Every 12 hours 12/07/16 1511 12/10/16 1719   12/06/16 2200  cefpodoxime (VANTIN) tablet 200 mg  Status:  Discontinued     200 mg Oral Every 12 hours 12/06/16 1818 12/06/16 1948   12/06/16 2200  metroNIDAZOLE (FLAGYL) tablet 500 mg  Status:  Discontinued     500 mg Oral Every 8 hours 12/06/16 1818 12/07/16 1511   12/06/16 2000  ciprofloxacin (CIPRO) tablet 250 mg  Status:  Discontinued     250 mg Oral 2 times daily 12/06/16 1948 12/07/16 1511      Subjective:   [name] [name] seen and examined today.  Patient states he is feeling better today, but complains of back pain from sitting in the chair. Denies chest pain, shortness of breath, abdominal pain, N/V/D/C.    Objective:   Vitals:   12/11/16 0511 12/11/16 1322 12/11/16 2055 12/12/16 0630  BP: (!) 100/54 (!) 115/53 (!) 135/59 (!) 123/57  Pulse: 81 80 81 77  Resp: 17 18 17 17   Temp: 98.8 F (37.1 C) 97.6 F (36.4 C) 98.1 F (36.7 C) 98.3 F (36.8 C)  TempSrc: Oral Oral Oral Oral  SpO2: 98% 100% 98% 97%  Weight:      Height:        Intake/Output Summary (Last 24 hours) at 12/12/16 1159 Last data filed at 12/11/16 2059  Gross per 24 hour  Intake              290 ml  Output             1400 ml  Net            -1110 ml   Filed Weights   12/06/16 1708  Weight: 70.3 kg (155 lb)    Exam  General: Well developed, well nourished, no distress  HEENT: NCAT, Sclera icterus, mucous membranes moist.   Cardiovascular: S1 S2 auscultated, no murmurs, RRR  Respiratory: Clear to auscultation bilaterally   Abdomen: Soft, nontender, nondistended, + bowel sounds  Extremities: warm dry without cyanosis clubbing or edema  Neuro: AAOx3, nonfocal   Skin: Without rashes exudates or nodules, jaundice. Mild breakdown between gluteal cleft  Psych:  Normal affect and demeanor    Data Reviewed: I have personally reviewed following labs and imaging studies  CBC:  Recent Labs Lab 12/08/16 0516 12/09/16 0455 12/10/16 0412 12/11/16 0749 12/12/16 0710  WBC 8.7 7.3 7.7 7.1 8.1  HGB 8.7* 8.0* 8.1* 7.7* 7.9*  HCT 25.3* 23.3* 23.5* 22.5* 23.4*  MCV 93.7 95.9 95.5 97.0 97.5  PLT 287 264 271 259 443   Basic Metabolic Panel:  Recent Labs Lab 12/06/16 1900  12/08/16 0516 12/09/16 0455 12/10/16 0412 12/11/16 0749 12/12/16 0710  NA 134*  < > 134* 133* 135 129* 134*  K 3.1*  < > 3.3* 3.3* 3.6 3.4* 3.9  CL 96*  < > 103 104 103 94* 101  CO2 24  < > 24 24 24 24 27   GLUCOSE 102*  < >  138* 124* 156* 112* 106*  BUN 15  < > 11 11 9 10 9   CREATININE 1.95*  < > 1.53* 1.48* 1.42* 1.44* 1.56*  CALCIUM 8.7*  < > 8.1* 7.8* 8.2* 8.1* 8.1*  MG 1.7  --  1.4*  --   --  1.7 2.1  PHOS 3.4  --   --   --   --   --   --   < > = values in this interval not displayed. GFR: Estimated Creatinine Clearance: 34.4 mL/min (by C-G formula based on SCr of 1.56 mg/dL (H)). Liver Function Tests:  Recent Labs Lab 12/08/16 0516 12/09/16 0455 12/10/16 0412 12/11/16 0749 12/12/16 0710  AST 225* 186* 198* 161* 137*  ALT 121* 116* 113* 102* 96*  ALKPHOS 858* 751* 819* 742* 795*  BILITOT 18.9* 20.3* 14.9* 9.9* 9.2*  PROT 5.0* 5.2* 4.9* 4.8* 5.5*  ALBUMIN 2.1* 2.0* 2.0* 1.9* 2.0*    Recent Labs Lab 12/07/16 0436  LIPASE 11   No results for input(s): AMMONIA in the last 168 hours. Coagulation Profile: No results for input(s): INR, PROTIME in the last 168 hours. Cardiac Enzymes: No results for input(s): CKTOTAL, CKMB, CKMBINDEX, TROPONINI in the last 168 hours. BNP (last 3 results) No results for input(s): PROBNP in the last 8760 hours. HbA1C: No results for input(s): HGBA1C in the last 72 hours. CBG: No results for input(s): GLUCAP in the last 168 hours. Lipid Profile: No results for input(s): CHOL, HDL, LDLCALC, TRIG, CHOLHDL, LDLDIRECT in the  last 72 hours. Thyroid Function Tests: No results for input(s): TSH, T4TOTAL, FREET4, T3FREE, THYROIDAB in the last 72 hours. Anemia Panel: No results for input(s): VITAMINB12, FOLATE, FERRITIN, TIBC, IRON, RETICCTPCT in the last 72 hours. Urine analysis:    Component Value Date/Time   COLORURINE YELLOW 11/22/2016 2052   APPEARANCEUR CLEAR 11/22/2016 2052   LABSPEC 1.013 11/22/2016 2052   PHURINE 5.0 11/22/2016 2052   GLUCOSEU NEGATIVE 11/22/2016 2052   HGBUR LARGE (A) 11/22/2016 2052   [place] NEGATIVE 11/22/2016 2052   [place] NEGATIVE 11/22/2016 2052   PROTEINUR 30 (A) 11/22/2016 2052   NITRITE NEGATIVE 11/22/2016 2052   LEUKOCYTESUR NEGATIVE 11/22/2016 2052   Sepsis Labs: @LABRCNTIP (procalcitonin:4,lacticidven:4)  )No results found for this or any previous visit (from the past 240 hour(s)).    Radiology Studies: No results found.   Scheduled Meds: . calcium carbonate  500 mg of elemental calcium Oral Q breakfast  . ciprofloxacin  500 mg Oral BID  . enoxaparin (LOVENOX) injection  40 mg Subcutaneous Q24H  . feeding supplement (ENSURE ENLIVE)  237 mL Oral BID BM  . folic acid  1 mg Oral Daily  . metroNIDAZOLE  500 mg Oral Q12H  . mirabegron ER  50 mg Oral Daily  . multivitamin  1 tablet Oral Daily  . multivitamin with minerals  1 tablet Oral Daily  . pantoprazole  40 mg Oral Daily  . polyvinyl alcohol  1 drop Both Eyes BID  . sodium chloride flush  3 mL Intravenous Q12H  . thiamine  100 mg Oral Daily   Continuous Infusions:   LOS: 6 days   Time Spent in minutes   30 minutes  Fabienne Nolasco D.O. on 12/12/2016 at 11:59 AM  Between 7am to 7pm - Pager - [phone]  After 7pm go to [email or web address] - password [email or web address]  And look for the night coverage person covering for me after hours  [facility] Office  [phone]

## 2016-12-12 NOTE — Progress Notes (Signed)
Subjective: No abdominal pain. Tolerating diet. No blood in stool.  Objective: Vital signs in last 24 hours: Temp:  [97.6 F (36.4 C)-98.3 F (36.8 C)] 98.3 F (36.8 C) (03/08 0630) Pulse Rate:  [77-81] 77 (03/08 0630) Resp:  [17-18] 17 (03/08 0630) BP: (115-135)/(53-59) 123/57 (03/08 0630) SpO2:  [97 %-100 %] 97 % (03/08 0630) Weight change:  Last BM Date: 12/11/16  PE: GEN:  Less jaundiced-appearing  Lab Results: CBC    Component Value Date/Time   WBC 8.1 12/12/2016 0710   RBC 2.40 (L) 12/12/2016 0710   HGB 7.9 (L) 12/12/2016 0710   HCT 23.4 (L) 12/12/2016 0710   PLT 273 12/12/2016 0710   MCV 97.5 12/12/2016 0710   MCH 32.9 12/12/2016 0710   MCHC 33.8 12/12/2016 0710   RDW 15.1 12/12/2016 0710   LYMPHSABS 1.1 11/25/2016 0510   MONOABS 0.7 11/25/2016 0510   EOSABS 0.1 11/25/2016 0510   BASOSABS 0.1 11/25/2016 0510   CMP     Component Value Date/Time   NA 134 (L) 12/12/2016 0710   K 3.9 12/12/2016 0710   CL 101 12/12/2016 0710   CO2 27 12/12/2016 0710   GLUCOSE 106 (H) 12/12/2016 0710   BUN 9 12/12/2016 0710   CREATININE 1.56 (H) 12/12/2016 0710   CALCIUM 8.1 (L) 12/12/2016 0710   PROT 5.5 (L) 12/12/2016 0710   ALBUMIN 2.0 (L) 12/12/2016 0710   AST 137 (H) 12/12/2016 0710   ALT 96 (H) 12/12/2016 0710   ALKPHOS 795 (H) 12/12/2016 0710   BILITOT 9.2 (H) 12/12/2016 0710   GFRNONAA 39 (L) 12/12/2016 0710   GFRAA 45 (L) 12/12/2016 0710   Assessment:  1. Cholangitis, on antibiotics, resolving. 2. Elevated LFTs, down-trending. 3. Acute renal failure, improving. 4. Pancreatic mass.  Stent cytology showed atypical cells otherwise unrevealing. 5.  Anemia, slow down-trend, but stable over the past 24 hours.  Hgb has fluctuated in past, baseline 9-10 over the years.  No overt bleeding.  Plan:  1.  Patient transitioned to oral antibiotics, would complete 10-day total course of treatment. 2.  PT/OT evaluation pending, which will largely help determine  patient's disposition.  From pure GI tract perspective, patient able to be discharged. 3. Follow CBCs. 4.  Continue soft diet. 5.  [facility] will follow, so long as patient remains hospitalized, and will then make outpatient follow-up for further investigation into patient's pancreatic mass.   [name] [name] 12/12/2016, 12:38 PM   Pager [phone] If no answer or after 5 PM call [phone]

## 2016-12-13 LAB — COMPREHENSIVE METABOLIC PANEL
ALK PHOS: 850 U/L — AB (ref 38–126)
ALT: 94 U/L — AB (ref 17–63)
AST: 132 U/L — AB (ref 15–41)
Albumin: 2.1 g/dL — ABNORMAL LOW (ref 3.5–5.0)
Anion gap: 10 (ref 5–15)
BILIRUBIN TOTAL: 8.6 mg/dL — AB (ref 0.3–1.2)
BUN: 7 mg/dL (ref 6–20)
CALCIUM: 8.4 mg/dL — AB (ref 8.9–10.3)
CHLORIDE: 98 mmol/L — AB (ref 101–111)
CO2: 26 mmol/L (ref 22–32)
CREATININE: 1.64 mg/dL — AB (ref 0.61–1.24)
GFR, EST AFRICAN AMERICAN: 42 mL/min — AB (ref >60)
GFR, EST NON AFRICAN AMERICAN: 37 mL/min — AB (ref >60)
Glucose, Bld: 105 mg/dL — ABNORMAL HIGH (ref 65–99)
Potassium: 4.1 mmol/L (ref 3.5–5.1)
Sodium: 134 mmol/L — ABNORMAL LOW (ref 135–145)
TOTAL PROTEIN: 5.8 g/dL — AB (ref 6.5–8.1)

## 2016-12-13 LAB — CBC
HCT: 23.7 % — ABNORMAL LOW (ref 39.0–52.0)
Hemoglobin: 7.9 g/dL — ABNORMAL LOW (ref 13.0–17.0)
MCH: 32.2 pg (ref 26.0–34.0)
MCHC: 33.3 g/dL (ref 30.0–36.0)
MCV: 96.7 fL (ref 78.0–100.0)
PLATELETS: 287 10*3/uL (ref 150–400)
RBC: 2.45 MIL/uL — AB (ref 4.22–5.81)
RDW: 15.3 % (ref 11.5–15.5)
WBC: 8.2 10*3/uL (ref 4.0–10.5)

## 2016-12-13 MED ORDER — METRONIDAZOLE 500 MG PO TABS: 500.0000 mg | ORAL_TABLET | Freq: Three times a day (TID) | ORAL | 0 refills | Status: DC

## 2016-12-13 MED ORDER — CIPROFLOXACIN HCL 500 MG PO TABS: 500.0000 mg | ORAL_TABLET | Freq: Two times a day (BID) | ORAL | 0 refills | Status: DC

## 2016-12-13 MED ORDER — ENSURE ENLIVE PO LIQD: 237.0000 mL | Freq: Two times a day (BID) | ORAL | 0 refills | Status: AC

## 2016-12-13 MED ORDER — TRAMADOL HCL 50 MG PO TABS: 50.0000 mg | ORAL_TABLET | Freq: Four times a day (QID) | ORAL | 0 refills | Status: AC | PRN

## 2016-12-13 NOTE — Discharge Instructions (Signed)
Jaundice, Adult Jaundice is a yellowish discoloration of the skin, the whites of the eyes, and mucous membranes. Jaundice can be a sign that the liver or the bile system is not working normally. What are the causes? This condition is caused by an increased level of bilirubin in the blood. Bilirubin is a substance that is produced by the normal breakdown of red blood cells. Conditions and activities that can cause an increase in the bilirubin level include:  Viral hepatitis.  Gallstones or other conditions, such as a tumor, that can cause a blockage of bile ducts.  Excessive use of alcohol.  Other liver diseases, such as cirrhosis.  Certain cancers.  Certain infections.  Certain genetic syndromes.  Certain medicines.  What are the signs or symptoms? Symptoms of this condition include:  Yellow color to the skin, the whites of the eyes, or mucous membranes.  Dark brown urine.  Stomach pain.  Light or clay-colored stool.  Itchy skin (pruritus).  How is this diagnosed? This condition is diagnosed with a medical history, physical exam, and blood tests. You may have additional tests to determine what is causing your bilirubin level to increase. How is this treated? Treatment for jaundice depends on the underlying condition. Treatment may include:  Stopping the use of a certain medicine.  Fluids that are given through an IV tube that is inserted into one of your veins.  Medicines to treat pruritus.  Surgery, if there is blockage of the bile ducts.  Follow these instructions at home:  Drink enough fluid to keep your urine clear or pale yellow.  Do not drink alcohol.  Take medicines only as directed by your health care provider.  Keep all follow-up visits as directed by your health care provider. This is important.  You may use skin lotion to relieve itching. Contact a health care provider if:  You have a fever. Get help right away if:  Your symptoms suddenly get  worse.  You have symptoms for more than 72 hours.  Your pain gets worse.  You vomit repeatedly.  You become weak or confused.  You develop a severe headache.  You become severely dehydrated. Signs of severe dehydration include: ? A very dry mouth. ? A rapid, weak pulse. ? Rapid breathing. ? Blue lips. This information is not intended to replace advice given to you by your health care provider. Make sure you discuss any questions you have with your health care provider. Document Released: 09/23/2005 Document Revised: 02/29/2016 Document Reviewed: 09/19/2014 Elsevier Interactive Patient Education  2017 [employer]  

## 2016-12-13 NOTE — Progress Notes (Signed)
Subjective: No abdominal pain. Tolerating diet.  Objective: Vital signs in last 24 hours: Temp:  [97.7 F (36.5 C)-98.1 F (36.7 C)] 98.1 F (36.7 C) (03/09 0651) Pulse Rate:  [74-81] 81 (03/09 0651) Resp:  [18-20] 20 (03/09 0651) BP: (124-144)/(53-70) 144/70 (03/09 0651) SpO2:  [97 %-98 %] 97 % (03/09 0651) Weight change:  Last BM Date: 12/12/16  PE: GEN:  NAD, elderly  HEENT:  Less jaundiced   Lab Results: CBC    Component Value Date/Time   WBC 8.2 12/13/2016 0730   RBC 2.45 (L) 12/13/2016 0730   HGB 7.9 (L) 12/13/2016 0730   HCT 23.7 (L) 12/13/2016 0730   PLT 287 12/13/2016 0730   MCV 96.7 12/13/2016 0730   MCH 32.2 12/13/2016 0730   MCHC 33.3 12/13/2016 0730   RDW 15.3 12/13/2016 0730   LYMPHSABS 1.1 11/25/2016 0510   MONOABS 0.7 11/25/2016 0510   EOSABS 0.1 11/25/2016 0510   BASOSABS 0.1 11/25/2016 0510   CMP     Component Value Date/Time   NA 134 (L) 12/13/2016 0730   K 4.1 12/13/2016 0730   CL 98 (L) 12/13/2016 0730   CO2 26 12/13/2016 0730   GLUCOSE 105 (H) 12/13/2016 0730   BUN 7 12/13/2016 0730   CREATININE 1.64 (H) 12/13/2016 0730   CALCIUM 8.4 (L) 12/13/2016 0730   PROT 5.8 (L) 12/13/2016 0730   ALBUMIN 2.1 (L) 12/13/2016 0730   AST 132 (H) 12/13/2016 0730   ALT 94 (H) 12/13/2016 0730   ALKPHOS 850 (H) 12/13/2016 0730   BILITOT 8.6 (H) 12/13/2016 0730   GFRNONAA 37 (L) 12/13/2016 0730   GFRAA 42 (L) 12/13/2016 0730    Assessment:  1. Cholangitis, on antibiotics, resolving. 2. Elevated LFTs, down-trending. 3. Acute renal failure, improving. 4. Pancreatic mass. Stent cytology showed atypical cells otherwise unrevealing. 5. Anemia, slow down-trend, but stable over the past 48 hours. Hgb has fluctuated in past, baseline 9-10 over the years. No overt bleeding.  Plan:  1.  Continue total 10-day course of antibiotics. 2.  PT/OT input re: disposition. 3.  OK to discharge home from GI perspective. 4.  Would make sure patient has  LFTs and CBC done at PCP next Thursday/Friday. 5.  Will make outpatient follow-up in 2-3 weeks. 6.  Will sign-off; please call with questions; thank you for the consultation.   [name] [name] 12/13/2016, 9:48 AM   Pager [phone] If no answer or after 5 PM call [phone]

## 2016-12-13 NOTE — Discharge Summary (Signed)
Physician Discharge Summary  [name] [name] OEH:[id number] DOB: 08-Aug-1929 DOA: 12/06/2016  PCP: Limmie Patricia, MD  Admit date: 12/06/2016 Discharge date: 12/13/2016  Time spent: 45 minutes  Recommendations for Outpatient Follow-up:  Patient will be discharged to home health with physical therapy, nursing, and aid.  Patient will need to have labs, CMP, Magnesium, CBC drawn in one week by home health and sent to Dr. Paulita Fujita. Patient will need to follow up with primary care provider within one week of discharge.  Patient should continue medications as prescribed.  Patient should follow a soft/regular diet.   Discharge Diagnoses:  Jaundice, transaminitis, acute cholangitis, gallstones, pancreatic mass with elevated Ca 19-9 AKI imposed on CKD stage III Anemia of chronic disease Skin injury, Bilat buttocks surrounding rectum are red and macerated, not a pressure injury  Severe PCM Hyponatremia Deconditioning Hypokalemia/hypomagnesemia   Discharge Condition: Stable  Diet recommendation: soft/regular  Filed Weights   12/06/16 1708  Weight: 70.3 kg (155 lb)    History of present illness:  on 12/06/2016 by Dr. Mart Piggs 83 y.o.gentleman with a history of HTN, HLD, CKD 3, chronic anemia, GERD, and biliary obstruction requiring CBD stent in November 2016 recently hospitalized for sepsis, E. Coli bacteremia, prostate cancer, on last admission was found to have transaminitis and hyperbilirubinemia and these enzymes trended down during the hospital stay and pt was discharge with intent to follow up with Dr. Paulita Fujita. Dr. Paulita Fujita requested now direct admission as blood work was notable for rise in Cr to 2.03 (from 1.3 on recent discharge), bilirubin also noted to be 19 (was normal about 10 days ago). Pt reports noticing progressively worsening jaundice in the past week and occasional epigastric pain, dull and intermittent with no specific alleviating or aggravating factors, occasionally associated  with nausea and per daughter and wife at bedside also some confusion. Dr. Paulita Fujita plans to perform ERCP on Monday. Pt currently denies chest pain or dyspnea, no fevers, chills, and currently no abd pain.  Hospital Course:  Jaundice, transaminitis, acute cholangitis, gallstones, pancreatic mass with elevated Ca 19-9 -in the setting of known pancreatic mass 11/22/2016, 2.6 x 3.4 cm in size -also on the same CT scan, interval development of diffuse dilatation of the main pancreatic duct -pt is s/p ERCP which was done 3/4, stent occlusion with stones/debris/clots -stent was removed and replaced 3/4, drainage appeared good at the end of the procedure per Dr. Paulita Fujita  -LFTS and bilirubin trending downward -Will transition IV cipro and flagyl to oral -will continue Cipro and flagyl- 10 days total -pancreatic mass is highly worrisome for adenocarcinoma, no tissue diagnosis available at this time  -cytology report negative, repeat endoscopic eval on hold given is current illness and patient is improving -Repeat CMP in one week -Follow up with Dr. Paulita Fujita  AKI imposed on CKD stage III -reported Cr 2.03 prior to this admission  -appears to be pre renal -Creatinine improving and stable, currently 1.64 -repeat BMP in one week  Anemia of chronic disease -? Malignancy related -hemoglobin currently 7.9 -no signs of active bleeding  -Repeat CBC in one week  Skin injury, Bilat buttocks surrounding rectum are red and macerated, not a pressure injury  -affected area is approx 5X5cm; appearance is consistent with moisture associated skin damage -this is also the same appearance to the posterior area of his scrotum and inner groin -Dressing procedure/placement/frequency: Barrier cream to protect skin and repel moisture -Air mattress ordered to decrease pressure and increase airflow to the affected areas.  -appreciate  wound care team following   Severe PCM -in the context of acute Illness, evidenced  by loss of >5% bw in <1 month, oral intake of </= 50% of needs for >/= 5 days  Hyponatremia -also pre renal -Repeat BMP in one week -Na improved, currently 134  Deconditioning -PT consulted and recommended HH  Hypokalemia/hypomagnesemia  -Resolved, Repeat in BMP/Mag in one week   Consultants Gastroenterology   Procedures  ERCP  Discharge Exam: Vitals:   12/12/16 2206 12/13/16 0651  BP: (!) 141/53 (!) 144/70  Pulse: 79 81  Resp: 18 20  Temp: 97.7 F (36.5 C) 98.1 F (36.7 C)   Feels better today. Denies chest pain, shortness of breath, abdominal pain, N/V/D/C.    Exam  General: Well developed, well nourished, no distress  HEENT: NCAT, Sclera icterus, mucous membranes moist.   Cardiovascular: S1 S2 auscultated, no murmurs, RRR  Respiratory: Clear to auscultation bilaterally   Abdomen: Soft, nontender, nondistended, + bowel sounds  Extremities: warm dry without cyanosis clubbing or edema  Neuro: AAOx3, nonfocal   Skin: Without rashes exudates or nodules, jaundice. Mild breakdown between gluteal cleft  Psych: Normal affect and demeanor, pleasant  Discharge Instructions Discharge Instructions    Discharge instructions    Complete by:  As directed    Patient will be discharged to home health with physical therapy, nursing, and aid.  Patient will need to have labs, CMP, Magnesium, CBC drawn in one week by home health and sent to Dr. Paulita Fujita. Patient will need to follow up with primary care provider within one week of discharge.  Patient should continue medications as prescribed.  Patient should follow a soft/regular diet.     Current Discharge Medication List    START taking these medications   Details  feeding supplement, ENSURE ENLIVE, (ENSURE ENLIVE) LIQD Take 237 mLs by mouth 2 (two) times daily between meals. Qty: 60 Bottle, Refills: 0    traMADol (ULTRAM) 50 MG tablet Take 1 tablet (50 mg total) by mouth every 6 (six) hours as needed for moderate  pain. Qty: 30 tablet, Refills: 0      CONTINUE these medications which have CHANGED   Details  ciprofloxacin (CIPRO) 500 MG tablet Take 1 tablet (500 mg total) by mouth 2 (two) times daily. Qty: 6 tablet, Refills: 0    metroNIDAZOLE (FLAGYL) 500 MG tablet Take 1 tablet (500 mg total) by mouth every 8 (eight) hours. Qty: 9 tablet, Refills: 0      CONTINUE these medications which have NOT CHANGED   Details  acetaminophen (TYLENOL) 500 MG tablet Take 1,000 mg by mouth every 6 (six) hours as needed for mild pain, moderate pain or headache.     Calcium Carbonate-Vitamin D (CALCIUM-D PO) Take 1 tablet by mouth daily.    folic acid (FOLVITE) 1 MG tablet Take 1 tablet (1 mg total) by mouth daily. Qty: 30 tablet, Refills: 2    Menthol-Methyl Salicylate (MUSCLE RUB) 10-15 % CREA Apply 1 application topically 2 (two) times daily.    mirabegron ER (MYRBETRIQ) 50 MG TB24 tablet Take 50 mg by mouth daily.    Multiple Vitamin (MULTIVITAMIN WITH MINERALS) TABS tablet Take 1 tablet by mouth daily.    Multiple Vitamins-Minerals (PRESERVISION AREDS 2) CAPS Take 1 capsule by mouth daily.    omeprazole (PRILOSEC) 40 MG capsule Take 40 mg by mouth daily.    Polyethyl Glycol-Propyl Glycol (SYSTANE) 0.4-0.3 % SOLN Place 1 drop into both eyes 2 (two) times daily as needed (dry  eyes).     thiamine 100 MG tablet Take 1 tablet (100 mg total) by mouth daily. Qty: 30 tablet, Refills: 2      STOP taking these medications     calcium carbonate (OSCAL) 1500 (600 Ca) MG TABS tablet        Allergies  Allergen Reactions  . Penicillins Hives and Other (See Comments)    Has patient had a PCN reaction causing immediate rash, facial/tongue/throat swelling, SOB or lightheadedness with hypotension: No Has patient had a PCN reaction causing severe rash involving mucus membranes or skin necrosis: No Has patient had a PCN reaction that required hospitalization No Has patient had a PCN reaction occurring within  the last 10 years: No If all of the above answers are "NO", then may proceed with Cephalosporin use.  Marland Kitchen Oxycontin [Oxycodone Hcl] Other (See Comments)    Reaction:  Altered mental status    Follow-up Information    [facility] Follow up.   Specialty:  Home Health Services Why:  home health services arranged (RN,PT,NA), office will call and set up home visits Contact information: [place] [address] [phone]        Limmie Patricia, MD. Schedule an appointment as soon as possible for a visit in 1 week(s).   Specialty:  Endocrinology Why:  Hopsital follow up Contact information: [place] [address] [phone]        Landry Dyke, MD. Schedule an appointment as soon as possible for a visit in 1 week(s).   Specialty:  Gastroenterology Why:  Hospital follow up Contact information: [address] [place] [address] [phone]            The results of significant diagnostics from this hospitalization (including imaging, microbiology, ancillary and laboratory) are listed below for reference.    Significant Diagnostic Studies: Ct Abdomen Pelvis W Wo Contrast  Result Date: 11/22/2016 CLINICAL DATA:  History of common bile duct stricture and potential pancreatic head mass. Common bile duct stent placed 09/06/2015. EXAM: CT ABDOMEN AND PELVIS WITHOUT AND WITH CONTRAST TECHNIQUE: Multidetector CT imaging of the abdomen and pelvis was performed following the standard protocol before and following the bolus administration of intravenous contrast. CONTRAST:  1 ISOVUE-300 IOPAMIDOL (ISOVUE-300) INJECTION 61%, 1 ISOVUE-300 IOPAMIDOL (ISOVUE-300) INJECTION 61% COMPARISON:  PET-CT 10/24/2015 FINDINGS: Lower chest:  Basilar atelectasis bilaterally. Hepatobiliary: The liver shows diffusely decreased attenuation suggesting steatosis. No focal abnormality within the liver parenchyma. No substantial  intrahepatic biliary duct dilatation. Pneumobilia is compatible with the presence of the common bile duct stent. Gallbladder unremarkable. Metallic common bile duct stent appears appropriately positioned. Pancreas: Soft tissue fullness noted in the uncinate process of the pancreas, more pronounced then on prior imaging studies. Soft tissue now appears more masslike and measures 2.6 x 3.4 cm. Diffuse dilatation of the main pancreatic duct is new since PET-CT of 10/24/2015. Spleen: No splenomegaly. No focal mass lesion. Adrenals/Urinary Tract: No adrenal nodule or mass. Cortical thinning noted both kidneys. No hydronephrosis. Stomach/Bowel: Small hiatal hernia. Stomach otherwise unremarkable. Duodenal wall thickening noted in the region of the stent device. Visualize small bowel loops and colonic segments of the abdomen are unremarkable. Vascular/Lymphatic: There is abdominal aortic atherosclerosis without aneurysm. There is no gastrohepatic or hepatoduodenal ligament lymphadenopathy. No intraperitoneal or retroperitoneal lymphadenopathy. Other: No intraperitoneal free fluid. Musculoskeletal: Old rib fractures are noted bilaterally. IMPRESSION: 1. Progression of soft tissue fullness in the uncinate process of the pancreas,  now appearing more masslike and measuring 2.6 x 3.4 cm. 2. Interval development of diffuse dilatation main pancreatic duct. 3. Metallic common bile duct stent appears appropriately positioned without substantial intra or extrahepatic biliary duct dilatation. Pneumobilia is associated. 4.  Abdominal Aortic Atherosclerois (ICD10-170.0) Electronically Signed   By: Misty Stanley M.D.   On: 11/22/2016 21:51   Dg Chest 2 View  Result Date: 11/22/2016 CLINICAL DATA:  Acute onset of generalized abdominal pain and sepsis. Initial encounter. EXAM: CHEST  2 VIEW COMPARISON:  Chest radiograph performed 09/01/2015, and PET/CT performed 10/24/2015 FINDINGS: The lungs are well-aerated. Bilateral airspace  opacification is concerning for multifocal pneumonia. No pleural effusion or pneumothorax is seen. The heart is borderline enlarged. No acute osseous abnormalities are seen. Chronic right-sided rib deformities are noted. IMPRESSION: 1. Bilateral airspace opacification, concerning for multifocal pneumonia. Followup PA and lateral chest X-ray is recommended in 3-4 weeks following trial of antibiotic therapy to ensure resolution and exclude underlying malignancy. 2. Borderline cardiomegaly. Electronically Signed   By: Garald Balding M.D.   On: 11/22/2016 23:41   Dg Lumbar Spine 2-3 Views  Result Date: 11/24/2016 CLINICAL DATA:  Lumbago with left lower extremity radicular symptoms EXAM: LUMBAR SPINE - 2-3 VIEW COMPARISON:  Mar 05, 2010 FINDINGS: Frontal, lateral, and spot lumbosacral lateral images were obtained. There are 5 non-rib-bearing lumbar type vertebral bodies. There is thoracolumbar levoscoliosis. There is no fracture or spondylolisthesis. Disc spaces appear unremarkable. No erosive change. No blastic or lytic lesions evident. There are seed implants in prostate. There is a stent in the biliary region on the right. There is atherosclerotic calcification in the aorta. IMPRESSION: Scoliosis. No fracture or spondylolisthesis. No appreciable arthropathy. No blastic or lytic lesions. No areas stent present. Seed implants in prostate. There is aortic atherosclerosis. Electronically Signed   By: Lowella Grip III M.D.   On: 11/24/2016 12:58   Ct Head Wo Contrast  Result Date: 11/22/2016 CLINICAL DATA:  Acute onset of left leg weakness. Initial encounter. EXAM: CT HEAD WITHOUT CONTRAST TECHNIQUE: Contiguous axial images were obtained from the base of the skull through the vertex without intravenous contrast. COMPARISON:  CT of the head performed 02/03/2011, and MRI of the brain performed 02/04/2011 FINDINGS: Brain: No evidence of acute infarction, hemorrhage, hydrocephalus, extra-axial collection or mass  lesion/mass effect. Prominence of the ventricles and sulci reflects moderately severe cortical volume loss. A large cavum septum pellucidum is noted. Scattered periventricular white matter change likely reflects small vessel ischemic microangiopathy. Mild cerebellar atrophy is noted. Mild chronic ischemic change is seen at the external capsule bilaterally. The brainstem and fourth ventricle are within normal limits. The cerebral hemispheres demonstrate grossly normal gray-white differentiation. No mass effect or midline shift is seen. Vascular: No hyperdense vessel or unexpected calcification. Skull: There is no evidence of fracture; visualized osseous structures are unremarkable in appearance. Sinuses/Orbits: The orbits are within normal limits. The paranasal sinuses and mastoid air cells are well-aerated. Other: No significant soft tissue abnormalities are seen. IMPRESSION: 1. No acute intracranial pathology seen on CT. 2. Moderately severe cortical volume loss and scattered small vessel ischemic microangiopathy. 3. Mild chronic ischemic change at the external capsule bilaterally. Electronically Signed   By: Garald Balding M.D.   On: 11/22/2016 23:50   US Abdomen Complete  Result Date: 12/07/2016 CLINICAL DATA:  Hyper bilirubin the media, jaundice. EXAM: ABDOMEN ULTRASOUND COMPLETE COMPARISON:  Abdomen ultrasound dated 09/01/2015. CT abdomen dated 11/22/2016. FINDINGS: Gallbladder: Mobile sludge again noted within the gallbladder. No gallbladder  wall thickening, pericholecystic fluid or other secondary signs of acute cholecystitis. Common bile duct: Diameter: Proximal common bile duct measures 9 mm diameter and mid common bile duct measures 13 mm diameter. There are 2 echogenic foci within the proximal and mid common bile duct, largest measuring 6 mm, highly suspicious for intraductal stones. Liver: No focal lesion identified. Within normal limits in parenchymal echogenicity. IVC: No abnormality visualized.  Pancreas: Visualized portion unremarkable. Spleen: Size and appearance within normal limits. Right Kidney: Length: 11.6 cm. Echogenicity within normal limits. No mass or hydronephrosis visualized. Left Kidney: Length: 10.5 cm. Echogenicity within normal limits. No mass or hydronephrosis visualized. Abdominal aorta: No aneurysm visualized. Other findings: None. IMPRESSION: 1. Probable stones within the dilated CBD, largest measuring 6 mm, with bile duct dilatation up to 13 mm diameter. Would consider confirmation with MRCP or ERCP. 2. Sludge within the gallbladder. No gallstones seen. No evidence of cholecystitis. Electronically Signed   By: Franki Cabot M.D.   On: 12/07/2016 20:22   Dg Shoulder Left  Result Date: 11/27/2016 CLINICAL DATA:  Pain for 5 days EXAM: LEFT SHOULDER - 2+ VIEW COMPARISON:  February 04, 2011 FINDINGS: Frontal, Y scapular, and axillary images were obtained. No acute fracture or dislocation. There is stable slight osteoarthritic change in the acromioclavicular joint. The glenohumeral joint appears unremarkable. No erosive change. There is evidence of old trauma in the mid humerus with healing. Visualized left lung clear. IMPRESSION: Slight osteoarthritic change in the acromioclavicular joint. No fracture or dislocation. Electronically Signed   By: Lowella Grip III M.D.   On: 11/27/2016 10:22   Dg Ercp With Sphincterotomy  Result Date: 12/08/2016 CLINICAL DATA:  Biliary obstruction, choledocholithiasis and history of prior common bile duct stent placement. EXAM: ERCP TECHNIQUE: Multiple spot images obtained with the fluoroscopic device and submitted for interpretation post-procedure. COMPARISON:  Ultrasound on 12/07/2016 and CT of the abdomen on 11/22/2016. Prior ERCP on 09/06/2015. FINDINGS: After cannulation of the common bile duct, contrast injection demonstrates multiple filling defects in the common bile duct. A balloon sweep maneuver was performed. A covered metallic self  expanding stent was then placed spanning across the mid and distal CBD into the duodenum. IMPRESSION: Filling defects in the common bile duct consistent with choledocholithiasis. After a balloon sweep maneuver, a new covered metallic stent was placed in the common bile duct. These images were submitted for radiologic interpretation only. Please see the procedural report for the amount of contrast and the fluoroscopy time utilized. Electronically Signed   By: Aletta Edouard M.D.   On: 12/08/2016 14:03    Microbiology: No results found for this or any previous visit (from the past 240 hour(s)).   Labs: Basic Metabolic Panel:  Recent Labs Lab 12/06/16 1900  12/08/16 0516 12/09/16 0455 12/10/16 0412 12/11/16 0749 12/12/16 0710 12/13/16 0730  NA 134*  < > 134* 133* 135 129* 134* 134*  K 3.1*  < > 3.3* 3.3* 3.6 3.4* 3.9 4.1  CL 96*  < > 103 104 103 94* 101 98*  CO2 24  < > 24 24 24 24 27 26   GLUCOSE 102*  < > 138* 124* 156* 112* 106* 105*  BUN 15  < > 11 11 9 10 9 7   CREATININE 1.95*  < > 1.53* 1.48* 1.42* 1.44* 1.56* 1.64*  CALCIUM 8.7*  < > 8.1* 7.8* 8.2* 8.1* 8.1* 8.4*  MG 1.7  --  1.4*  --   --  1.7 2.1  --   PHOS 3.4  --   --   --   --   --   --   --   < > =  values in this interval not displayed. Liver Function Tests:  Recent Labs Lab 12/09/16 0455 12/10/16 0412 12/11/16 0749 12/12/16 0710 12/13/16 0730  AST 186* 198* 161* 137* 132*  ALT 116* 113* 102* 96* 94*  ALKPHOS 751* 819* 742* 795* 850*  BILITOT 20.3* 14.9* 9.9* 9.2* 8.6*  PROT 5.2* 4.9* 4.8* 5.5* 5.8*  ALBUMIN 2.0* 2.0* 1.9* 2.0* 2.1*    Recent Labs Lab 12/07/16 0436  LIPASE 11   No results for input(s): AMMONIA in the last 168 hours. CBC:  Recent Labs Lab 12/09/16 0455 12/10/16 0412 12/11/16 0749 12/12/16 0710 12/13/16 0730  WBC 7.3 7.7 7.1 8.1 8.2  HGB 8.0* 8.1* 7.7* 7.9* 7.9*  HCT 23.3* 23.5* 22.5* 23.4* 23.7*  MCV 95.9 95.5 97.0 97.5 96.7  PLT 264 271 259 273 287   Cardiac Enzymes: No  results for input(s): CKTOTAL, CKMB, CKMBINDEX, TROPONINI in the last 168 hours. BNP: BNP (last 3 results) No results for input(s): BNP in the last 8760 hours.  ProBNP (last 3 results) No results for input(s): PROBNP in the last 8760 hours.  CBG: No results for input(s): GLUCAP in the last 168 hours.     SignedCristal Ford  [facility] 12/13/2016, 10:56 AM

## 2016-12-13 NOTE — Progress Notes (Signed)
[name] [name] to be D/C'd Home per MD order.  Discussed with the patient and all questions fully answered.  VSS, Skin clean, dry and intact without evidence of skin break down, no evidence of skin tears noted. IV catheter discontinued intact. Site without signs and symptoms of complications. Dressing and pressure applied.  An After Visit Summary was printed and given to the patient. Patient received prescription.  D/c education completed with patient/family including follow up instructions, medication list, d/c activities limitations if indicated, with other d/c instructions as indicated by MD - patient able to verbalize understanding, all questions fully answered.   Patient instructed to return to ED, call 911, or call MD for any changes in condition.   Patient escorted via [place], and D/C home via private auto.  Betha Loa Aviya Jarvie 12/13/2016 12:38 PM

## 2016-12-14 DIAGNOSIS — K219 Gastro-esophageal reflux disease without esophagitis: Secondary | ICD-10-CM | POA: Diagnosis not present

## 2016-12-14 DIAGNOSIS — I129 Hypertensive chronic kidney disease with stage 1 through stage 4 chronic kidney disease, or unspecified chronic kidney disease: Secondary | ICD-10-CM | POA: Diagnosis not present

## 2016-12-14 DIAGNOSIS — D649 Anemia, unspecified: Secondary | ICD-10-CM | POA: Diagnosis not present

## 2016-12-14 DIAGNOSIS — K8032 Calculus of bile duct with acute cholangitis without obstruction: Secondary | ICD-10-CM | POA: Diagnosis not present

## 2016-12-14 DIAGNOSIS — K869 Disease of pancreas, unspecified: Secondary | ICD-10-CM | POA: Diagnosis not present

## 2016-12-14 DIAGNOSIS — N183 Chronic kidney disease, stage 3 (moderate): Secondary | ICD-10-CM | POA: Diagnosis not present

## 2016-12-18 DIAGNOSIS — D649 Anemia, unspecified: Secondary | ICD-10-CM | POA: Diagnosis not present

## 2016-12-18 DIAGNOSIS — K869 Disease of pancreas, unspecified: Secondary | ICD-10-CM | POA: Diagnosis not present

## 2016-12-18 DIAGNOSIS — I129 Hypertensive chronic kidney disease with stage 1 through stage 4 chronic kidney disease, or unspecified chronic kidney disease: Secondary | ICD-10-CM | POA: Diagnosis not present

## 2016-12-18 DIAGNOSIS — K8032 Calculus of bile duct with acute cholangitis without obstruction: Secondary | ICD-10-CM | POA: Diagnosis not present

## 2016-12-18 DIAGNOSIS — N183 Chronic kidney disease, stage 3 (moderate): Secondary | ICD-10-CM | POA: Diagnosis not present

## 2016-12-18 DIAGNOSIS — K219 Gastro-esophageal reflux disease without esophagitis: Secondary | ICD-10-CM | POA: Diagnosis not present

## 2016-12-19 DIAGNOSIS — D649 Anemia, unspecified: Secondary | ICD-10-CM | POA: Diagnosis not present

## 2016-12-19 DIAGNOSIS — K838 Other specified diseases of biliary tract: Secondary | ICD-10-CM | POA: Diagnosis not present

## 2016-12-19 DIAGNOSIS — K830 Cholangitis: Secondary | ICD-10-CM | POA: Diagnosis not present

## 2016-12-20 DIAGNOSIS — D649 Anemia, unspecified: Secondary | ICD-10-CM | POA: Diagnosis not present

## 2016-12-20 DIAGNOSIS — K869 Disease of pancreas, unspecified: Secondary | ICD-10-CM | POA: Diagnosis not present

## 2016-12-20 DIAGNOSIS — I129 Hypertensive chronic kidney disease with stage 1 through stage 4 chronic kidney disease, or unspecified chronic kidney disease: Secondary | ICD-10-CM | POA: Diagnosis not present

## 2016-12-20 DIAGNOSIS — K219 Gastro-esophageal reflux disease without esophagitis: Secondary | ICD-10-CM | POA: Diagnosis not present

## 2016-12-20 DIAGNOSIS — N183 Chronic kidney disease, stage 3 (moderate): Secondary | ICD-10-CM | POA: Diagnosis not present

## 2016-12-20 DIAGNOSIS — K8032 Calculus of bile duct with acute cholangitis without obstruction: Secondary | ICD-10-CM | POA: Diagnosis not present

## 2016-12-23 DIAGNOSIS — I129 Hypertensive chronic kidney disease with stage 1 through stage 4 chronic kidney disease, or unspecified chronic kidney disease: Secondary | ICD-10-CM | POA: Diagnosis not present

## 2016-12-23 DIAGNOSIS — D649 Anemia, unspecified: Secondary | ICD-10-CM | POA: Diagnosis not present

## 2016-12-23 DIAGNOSIS — K8032 Calculus of bile duct with acute cholangitis without obstruction: Secondary | ICD-10-CM | POA: Diagnosis not present

## 2016-12-23 DIAGNOSIS — N183 Chronic kidney disease, stage 3 (moderate): Secondary | ICD-10-CM | POA: Diagnosis not present

## 2016-12-23 DIAGNOSIS — K869 Disease of pancreas, unspecified: Secondary | ICD-10-CM | POA: Diagnosis not present

## 2016-12-23 DIAGNOSIS — K219 Gastro-esophageal reflux disease without esophagitis: Secondary | ICD-10-CM | POA: Diagnosis not present

## 2016-12-24 DIAGNOSIS — N183 Chronic kidney disease, stage 3 (moderate): Secondary | ICD-10-CM | POA: Diagnosis not present

## 2016-12-24 DIAGNOSIS — K869 Disease of pancreas, unspecified: Secondary | ICD-10-CM | POA: Diagnosis not present

## 2016-12-24 DIAGNOSIS — I129 Hypertensive chronic kidney disease with stage 1 through stage 4 chronic kidney disease, or unspecified chronic kidney disease: Secondary | ICD-10-CM | POA: Diagnosis not present

## 2016-12-24 DIAGNOSIS — K8032 Calculus of bile duct with acute cholangitis without obstruction: Secondary | ICD-10-CM | POA: Diagnosis not present

## 2016-12-24 DIAGNOSIS — K219 Gastro-esophageal reflux disease without esophagitis: Secondary | ICD-10-CM | POA: Diagnosis not present

## 2016-12-24 DIAGNOSIS — D649 Anemia, unspecified: Secondary | ICD-10-CM | POA: Diagnosis not present

## 2016-12-26 DIAGNOSIS — K869 Disease of pancreas, unspecified: Secondary | ICD-10-CM | POA: Diagnosis not present

## 2016-12-26 DIAGNOSIS — I129 Hypertensive chronic kidney disease with stage 1 through stage 4 chronic kidney disease, or unspecified chronic kidney disease: Secondary | ICD-10-CM | POA: Diagnosis not present

## 2016-12-26 DIAGNOSIS — N183 Chronic kidney disease, stage 3 (moderate): Secondary | ICD-10-CM | POA: Diagnosis not present

## 2016-12-26 DIAGNOSIS — D649 Anemia, unspecified: Secondary | ICD-10-CM | POA: Diagnosis not present

## 2016-12-26 DIAGNOSIS — K8032 Calculus of bile duct with acute cholangitis without obstruction: Secondary | ICD-10-CM | POA: Diagnosis not present

## 2016-12-26 DIAGNOSIS — K219 Gastro-esophageal reflux disease without esophagitis: Secondary | ICD-10-CM | POA: Diagnosis not present

## 2016-12-27 DIAGNOSIS — K219 Gastro-esophageal reflux disease without esophagitis: Secondary | ICD-10-CM | POA: Diagnosis not present

## 2016-12-27 DIAGNOSIS — N183 Chronic kidney disease, stage 3 (moderate): Secondary | ICD-10-CM | POA: Diagnosis not present

## 2016-12-27 DIAGNOSIS — K869 Disease of pancreas, unspecified: Secondary | ICD-10-CM | POA: Diagnosis not present

## 2016-12-27 DIAGNOSIS — K8032 Calculus of bile duct with acute cholangitis without obstruction: Secondary | ICD-10-CM | POA: Diagnosis not present

## 2016-12-27 DIAGNOSIS — I129 Hypertensive chronic kidney disease with stage 1 through stage 4 chronic kidney disease, or unspecified chronic kidney disease: Secondary | ICD-10-CM | POA: Diagnosis not present

## 2016-12-27 DIAGNOSIS — D649 Anemia, unspecified: Secondary | ICD-10-CM | POA: Diagnosis not present

## 2016-12-31 DIAGNOSIS — N183 Chronic kidney disease, stage 3 (moderate): Secondary | ICD-10-CM | POA: Diagnosis not present

## 2016-12-31 DIAGNOSIS — K8032 Calculus of bile duct with acute cholangitis without obstruction: Secondary | ICD-10-CM | POA: Diagnosis not present

## 2016-12-31 DIAGNOSIS — K869 Disease of pancreas, unspecified: Secondary | ICD-10-CM | POA: Diagnosis not present

## 2016-12-31 DIAGNOSIS — I129 Hypertensive chronic kidney disease with stage 1 through stage 4 chronic kidney disease, or unspecified chronic kidney disease: Secondary | ICD-10-CM | POA: Diagnosis not present

## 2016-12-31 DIAGNOSIS — K219 Gastro-esophageal reflux disease without esophagitis: Secondary | ICD-10-CM | POA: Diagnosis not present

## 2016-12-31 DIAGNOSIS — D649 Anemia, unspecified: Secondary | ICD-10-CM | POA: Diagnosis not present

## 2017-01-01 ENCOUNTER — Other Ambulatory Visit: Payer: Self-pay | Admitting: Gastroenterology

## 2017-01-01 DIAGNOSIS — K869 Disease of pancreas, unspecified: Secondary | ICD-10-CM | POA: Diagnosis not present

## 2017-01-01 DIAGNOSIS — N183 Chronic kidney disease, stage 3 (moderate): Secondary | ICD-10-CM | POA: Diagnosis not present

## 2017-01-01 DIAGNOSIS — K8032 Calculus of bile duct with acute cholangitis without obstruction: Secondary | ICD-10-CM | POA: Diagnosis not present

## 2017-01-01 DIAGNOSIS — I129 Hypertensive chronic kidney disease with stage 1 through stage 4 chronic kidney disease, or unspecified chronic kidney disease: Secondary | ICD-10-CM | POA: Diagnosis not present

## 2017-01-01 DIAGNOSIS — K219 Gastro-esophageal reflux disease without esophagitis: Secondary | ICD-10-CM | POA: Diagnosis not present

## 2017-01-01 DIAGNOSIS — D649 Anemia, unspecified: Secondary | ICD-10-CM | POA: Diagnosis not present

## 2017-01-03 DIAGNOSIS — K219 Gastro-esophageal reflux disease without esophagitis: Secondary | ICD-10-CM | POA: Diagnosis not present

## 2017-01-03 DIAGNOSIS — K869 Disease of pancreas, unspecified: Secondary | ICD-10-CM | POA: Diagnosis not present

## 2017-01-03 DIAGNOSIS — I129 Hypertensive chronic kidney disease with stage 1 through stage 4 chronic kidney disease, or unspecified chronic kidney disease: Secondary | ICD-10-CM | POA: Diagnosis not present

## 2017-01-03 DIAGNOSIS — N183 Chronic kidney disease, stage 3 (moderate): Secondary | ICD-10-CM | POA: Diagnosis not present

## 2017-01-03 DIAGNOSIS — K8032 Calculus of bile duct with acute cholangitis without obstruction: Secondary | ICD-10-CM | POA: Diagnosis not present

## 2017-01-03 DIAGNOSIS — D649 Anemia, unspecified: Secondary | ICD-10-CM | POA: Diagnosis not present

## 2017-01-07 DIAGNOSIS — I129 Hypertensive chronic kidney disease with stage 1 through stage 4 chronic kidney disease, or unspecified chronic kidney disease: Secondary | ICD-10-CM | POA: Diagnosis not present

## 2017-01-07 DIAGNOSIS — D649 Anemia, unspecified: Secondary | ICD-10-CM | POA: Diagnosis not present

## 2017-01-07 DIAGNOSIS — K219 Gastro-esophageal reflux disease without esophagitis: Secondary | ICD-10-CM | POA: Diagnosis not present

## 2017-01-07 DIAGNOSIS — N183 Chronic kidney disease, stage 3 (moderate): Secondary | ICD-10-CM | POA: Diagnosis not present

## 2017-01-07 DIAGNOSIS — K8032 Calculus of bile duct with acute cholangitis without obstruction: Secondary | ICD-10-CM | POA: Diagnosis not present

## 2017-01-07 DIAGNOSIS — K869 Disease of pancreas, unspecified: Secondary | ICD-10-CM | POA: Diagnosis not present

## 2017-01-08 DIAGNOSIS — N183 Chronic kidney disease, stage 3 (moderate): Secondary | ICD-10-CM | POA: Diagnosis not present

## 2017-01-08 DIAGNOSIS — I129 Hypertensive chronic kidney disease with stage 1 through stage 4 chronic kidney disease, or unspecified chronic kidney disease: Secondary | ICD-10-CM | POA: Diagnosis not present

## 2017-01-08 DIAGNOSIS — K219 Gastro-esophageal reflux disease without esophagitis: Secondary | ICD-10-CM | POA: Diagnosis not present

## 2017-01-08 DIAGNOSIS — K8032 Calculus of bile duct with acute cholangitis without obstruction: Secondary | ICD-10-CM | POA: Diagnosis not present

## 2017-01-08 DIAGNOSIS — K869 Disease of pancreas, unspecified: Secondary | ICD-10-CM | POA: Diagnosis not present

## 2017-01-08 DIAGNOSIS — D649 Anemia, unspecified: Secondary | ICD-10-CM | POA: Diagnosis not present

## 2017-01-10 DIAGNOSIS — K219 Gastro-esophageal reflux disease without esophagitis: Secondary | ICD-10-CM | POA: Diagnosis not present

## 2017-01-10 DIAGNOSIS — K8032 Calculus of bile duct with acute cholangitis without obstruction: Secondary | ICD-10-CM | POA: Diagnosis not present

## 2017-01-10 DIAGNOSIS — K869 Disease of pancreas, unspecified: Secondary | ICD-10-CM | POA: Diagnosis not present

## 2017-01-10 DIAGNOSIS — N183 Chronic kidney disease, stage 3 (moderate): Secondary | ICD-10-CM | POA: Diagnosis not present

## 2017-01-10 DIAGNOSIS — I129 Hypertensive chronic kidney disease with stage 1 through stage 4 chronic kidney disease, or unspecified chronic kidney disease: Secondary | ICD-10-CM | POA: Diagnosis not present

## 2017-01-10 DIAGNOSIS — D649 Anemia, unspecified: Secondary | ICD-10-CM | POA: Diagnosis not present

## 2017-01-14 DIAGNOSIS — K869 Disease of pancreas, unspecified: Secondary | ICD-10-CM | POA: Diagnosis not present

## 2017-01-14 DIAGNOSIS — D649 Anemia, unspecified: Secondary | ICD-10-CM | POA: Diagnosis not present

## 2017-01-14 DIAGNOSIS — K219 Gastro-esophageal reflux disease without esophagitis: Secondary | ICD-10-CM | POA: Diagnosis not present

## 2017-01-14 DIAGNOSIS — N183 Chronic kidney disease, stage 3 (moderate): Secondary | ICD-10-CM | POA: Diagnosis not present

## 2017-01-14 DIAGNOSIS — K8032 Calculus of bile duct with acute cholangitis without obstruction: Secondary | ICD-10-CM | POA: Diagnosis not present

## 2017-01-14 DIAGNOSIS — I129 Hypertensive chronic kidney disease with stage 1 through stage 4 chronic kidney disease, or unspecified chronic kidney disease: Secondary | ICD-10-CM | POA: Diagnosis not present

## 2017-01-15 DIAGNOSIS — K219 Gastro-esophageal reflux disease without esophagitis: Secondary | ICD-10-CM | POA: Diagnosis not present

## 2017-01-15 DIAGNOSIS — K869 Disease of pancreas, unspecified: Secondary | ICD-10-CM | POA: Diagnosis not present

## 2017-01-15 DIAGNOSIS — K8032 Calculus of bile duct with acute cholangitis without obstruction: Secondary | ICD-10-CM | POA: Diagnosis not present

## 2017-01-15 DIAGNOSIS — N183 Chronic kidney disease, stage 3 (moderate): Secondary | ICD-10-CM | POA: Diagnosis not present

## 2017-01-15 DIAGNOSIS — I129 Hypertensive chronic kidney disease with stage 1 through stage 4 chronic kidney disease, or unspecified chronic kidney disease: Secondary | ICD-10-CM | POA: Diagnosis not present

## 2017-01-15 DIAGNOSIS — D649 Anemia, unspecified: Secondary | ICD-10-CM | POA: Diagnosis not present

## 2017-01-16 ENCOUNTER — Other Ambulatory Visit (HOSPITAL_COMMUNITY): Payer: Self-pay | Admitting: *Deleted

## 2017-01-16 ENCOUNTER — Encounter (HOSPITAL_COMMUNITY): Payer: Self-pay | Admitting: *Deleted

## 2017-01-16 DIAGNOSIS — K869 Disease of pancreas, unspecified: Secondary | ICD-10-CM | POA: Diagnosis not present

## 2017-01-16 DIAGNOSIS — D649 Anemia, unspecified: Secondary | ICD-10-CM | POA: Diagnosis not present

## 2017-01-16 DIAGNOSIS — K8032 Calculus of bile duct with acute cholangitis without obstruction: Secondary | ICD-10-CM | POA: Diagnosis not present

## 2017-01-16 DIAGNOSIS — N183 Chronic kidney disease, stage 3 (moderate): Secondary | ICD-10-CM | POA: Diagnosis not present

## 2017-01-16 DIAGNOSIS — I129 Hypertensive chronic kidney disease with stage 1 through stage 4 chronic kidney disease, or unspecified chronic kidney disease: Secondary | ICD-10-CM | POA: Diagnosis not present

## 2017-01-16 DIAGNOSIS — K219 Gastro-esophageal reflux disease without esophagitis: Secondary | ICD-10-CM | POA: Diagnosis not present

## 2017-01-16 NOTE — Progress Notes (Signed)
Spoke with dr hatchett anesthesia and made aware of 11-22-16 chest xray results and patient medical history. Order received from dr Jillyn Hidden and placed in epic for chest xray day of procedure 01-22-17. and placed on epic

## 2017-01-17 DIAGNOSIS — R838 Other abnormal findings in cerebrospinal fluid: Secondary | ICD-10-CM | POA: Diagnosis not present

## 2017-01-17 DIAGNOSIS — I129 Hypertensive chronic kidney disease with stage 1 through stage 4 chronic kidney disease, or unspecified chronic kidney disease: Secondary | ICD-10-CM | POA: Diagnosis not present

## 2017-01-17 DIAGNOSIS — K219 Gastro-esophageal reflux disease without esophagitis: Secondary | ICD-10-CM | POA: Diagnosis not present

## 2017-01-17 DIAGNOSIS — K8032 Calculus of bile duct with acute cholangitis without obstruction: Secondary | ICD-10-CM | POA: Diagnosis not present

## 2017-01-17 DIAGNOSIS — N183 Chronic kidney disease, stage 3 (moderate): Secondary | ICD-10-CM | POA: Diagnosis not present

## 2017-01-17 DIAGNOSIS — D649 Anemia, unspecified: Secondary | ICD-10-CM | POA: Diagnosis not present

## 2017-01-17 DIAGNOSIS — K869 Disease of pancreas, unspecified: Secondary | ICD-10-CM | POA: Diagnosis not present

## 2017-01-20 DIAGNOSIS — R269 Unspecified abnormalities of gait and mobility: Secondary | ICD-10-CM | POA: Diagnosis not present

## 2017-01-20 DIAGNOSIS — M6281 Muscle weakness (generalized): Secondary | ICD-10-CM | POA: Diagnosis not present

## 2017-01-21 ENCOUNTER — Other Ambulatory Visit: Payer: Self-pay | Admitting: Gastroenterology

## 2017-01-22 ENCOUNTER — Ambulatory Visit (HOSPITAL_COMMUNITY): Payer: Medicare Other | Admitting: Certified Registered Nurse Anesthetist

## 2017-01-22 ENCOUNTER — Ambulatory Visit (HOSPITAL_COMMUNITY): Payer: Medicare Other

## 2017-01-22 ENCOUNTER — Encounter (HOSPITAL_COMMUNITY): Payer: Self-pay | Admitting: *Deleted

## 2017-01-22 ENCOUNTER — Encounter (HOSPITAL_COMMUNITY)
Admission: RE | Disposition: A | Discharge status: Home / Self Care | Payer: Self-pay | Source: Ambulatory Visit | Attending: Gastroenterology

## 2017-01-22 ENCOUNTER — Ambulatory Visit (HOSPITAL_COMMUNITY)
Admission: RE | Admit: 2017-01-22 | Discharge: 2017-01-22 | Disposition: A | Discharge status: Home / Self Care | Payer: Medicare Other | Source: Ambulatory Visit | Attending: Gastroenterology | Admitting: Gastroenterology

## 2017-01-22 DIAGNOSIS — Z79899 Other long term (current) drug therapy: Secondary | ICD-10-CM | POA: Diagnosis not present

## 2017-01-22 DIAGNOSIS — K831 Obstruction of bile duct: Secondary | ICD-10-CM | POA: Diagnosis not present

## 2017-01-22 DIAGNOSIS — K2970 Gastritis, unspecified, without bleeding: Secondary | ICD-10-CM | POA: Insufficient documentation

## 2017-01-22 DIAGNOSIS — M1990 Unspecified osteoarthritis, unspecified site: Secondary | ICD-10-CM | POA: Insufficient documentation

## 2017-01-22 DIAGNOSIS — K219 Gastro-esophageal reflux disease without esophagitis: Secondary | ICD-10-CM | POA: Insufficient documentation

## 2017-01-22 DIAGNOSIS — R748 Abnormal levels of other serum enzymes: Secondary | ICD-10-CM | POA: Insufficient documentation

## 2017-01-22 DIAGNOSIS — K449 Diaphragmatic hernia without obstruction or gangrene: Secondary | ICD-10-CM | POA: Insufficient documentation

## 2017-01-22 DIAGNOSIS — K264 Chronic or unspecified duodenal ulcer with hemorrhage: Secondary | ICD-10-CM | POA: Diagnosis not present

## 2017-01-22 DIAGNOSIS — I10 Essential (primary) hypertension: Secondary | ICD-10-CM | POA: Insufficient documentation

## 2017-01-22 DIAGNOSIS — K869 Disease of pancreas, unspecified: Secondary | ICD-10-CM | POA: Diagnosis not present

## 2017-01-22 DIAGNOSIS — Z87891 Personal history of nicotine dependence: Secondary | ICD-10-CM | POA: Diagnosis not present

## 2017-01-22 DIAGNOSIS — I700 Atherosclerosis of aorta: Secondary | ICD-10-CM | POA: Diagnosis not present

## 2017-01-22 DIAGNOSIS — D649 Anemia, unspecified: Secondary | ICD-10-CM | POA: Diagnosis not present

## 2017-01-22 DIAGNOSIS — K222 Esophageal obstruction: Secondary | ICD-10-CM | POA: Diagnosis not present

## 2017-01-22 DIAGNOSIS — Z09 Encounter for follow-up examination after completed treatment for conditions other than malignant neoplasm: Secondary | ICD-10-CM

## 2017-01-22 DIAGNOSIS — R9389 Abnormal findings on diagnostic imaging of other specified body structures: Secondary | ICD-10-CM

## 2017-01-22 HISTORY — PX: ESOPHAGOGASTRODUODENOSCOPY (EGD) WITH PROPOFOL: SHX5813

## 2017-01-22 HISTORY — PX: EUS: SHX5427

## 2017-01-22 HISTORY — DX: Gastro-esophageal reflux disease without esophagitis: K21.9

## 2017-01-22 HISTORY — DX: Other specified diseases of pancreas: K86.89

## 2017-01-22 HISTORY — DX: Pneumonia, unspecified organism: J18.9

## 2017-01-22 SURGERY — UPPER ENDOSCOPIC ULTRASOUND (EUS) RADIAL
Anesthesia: Monitor Anesthesia Care

## 2017-01-22 MED ORDER — GLUCAGON HCL RDNA (DIAGNOSTIC) 1 MG IJ SOLR
INTRAMUSCULAR | Status: AC
  Filled 2017-01-22: qty 1

## 2017-01-22 MED ORDER — PROPOFOL 10 MG/ML IV BOLUS
INTRAVENOUS | Status: AC
  Filled 2017-01-22: qty 60

## 2017-01-22 MED ORDER — SODIUM CHLORIDE 0.9 % IV SOLN: INTRAVENOUS | Status: DC

## 2017-01-22 MED ORDER — LIDOCAINE HCL (CARDIAC) 20 MG/ML IV SOLN
INTRAVENOUS | Status: DC | PRN
  Administered 2017-01-22 (×2): 50 mg via INTRAVENOUS

## 2017-01-22 MED ORDER — PROPOFOL 10 MG/ML IV BOLUS
INTRAVENOUS | Status: DC | PRN
  Administered 2017-01-22: 30 mg via INTRAVENOUS
  Administered 2017-01-22: 20 mg via INTRAVENOUS

## 2017-01-22 MED ORDER — ONDANSETRON HCL 4 MG/2ML IJ SOLN
INTRAMUSCULAR | Status: DC | PRN
  Administered 2017-01-22: 4 mg via INTRAVENOUS

## 2017-01-22 MED ORDER — PROPOFOL 500 MG/50ML IV EMUL
INTRAVENOUS | Status: DC | PRN
  Administered 2017-01-22: 75 ug/kg/min via INTRAVENOUS

## 2017-01-22 MED ORDER — LACTATED RINGERS IV SOLN
INTRAVENOUS | Status: DC
  Administered 2017-01-22 (×2): via INTRAVENOUS

## 2017-01-22 MED ORDER — CIPROFLOXACIN IN D5W 400 MG/200ML IV SOLN
INTRAVENOUS | Status: AC
  Filled 2017-01-22: qty 200

## 2017-01-22 MED ORDER — ONDANSETRON HCL 4 MG/2ML IJ SOLN
INTRAMUSCULAR | Status: AC
  Filled 2017-01-22: qty 2

## 2017-01-22 MED ORDER — INDOMETHACIN 50 MG RE SUPP
RECTAL | Status: AC
  Filled 2017-01-22: qty 2

## 2017-01-22 MED ORDER — LIDOCAINE 2% (20 MG/ML) 5 ML SYRINGE
INTRAMUSCULAR | Status: AC
  Filled 2017-01-22: qty 5

## 2017-01-22 NOTE — H&P (Signed)
Patient interval history reviewed.  Patient examined again.  There has been no change from documented H/P dated 01/21/17 (scanned into chart from our office) except as documented above.  Assessment:  1.  Pancreatic mass. 2.  Biliary stricture.  Plan:  1.  Endoscopic ultrasound with possible fine needle aspiration. 2.  Risks (bleeding, infection, bowel perforation that could require surgery, sedation-related changes in cardiopulmonary systems), benefits (identification and possible treatment of source of symptoms, exclusion of certain causes of symptoms), and alternatives (watchful waiting, radiographic imaging studies, empiric medical treatment) of upper endoscopy with ultrasound and possible fine needle aspiration (EGD, EUS +/- FNA) were explained to patient/family in detail and patient wishes to proceed. 3.  If unable to obtain tissue diagnosis, may have to remove/replace biliary stent. 4.  Risks (up to and including bleeding, infection, perforation, pancreatitis that can be complicated by infected necrosis and death), benefits (removal of stones, alleviating blockage, decreasing risk of cholangitis or choledocholithiasis-related pancreatitis), and alternatives (watchful waiting, percutaneous transhepatic cholangiography) of ERCP were explained to patient/family in detail and patient elects to proceed.

## 2017-01-22 NOTE — Anesthesia Preprocedure Evaluation (Addendum)
Anesthesia Evaluation  Patient identified by MRN, date of birth, ID band Patient awake    Reviewed: Allergy & Precautions  Airway Mallampati: II  TM Distance: >3 FB     Dental   Pulmonary pneumonia, former smoker,    breath sounds clear to auscultation (-) wheezing      Cardiovascular hypertension,  Rhythm:Regular Rate:Normal     Neuro/Psych    GI/Hepatic hiatal hernia, GERD  ,  Endo/Other    Renal/GU Renal disease     Musculoskeletal  (+) Arthritis ,   Abdominal   Peds  Hematology  (+) anemia ,   Anesthesia Other Findings   Reproductive/Obstetrics                             Anesthesia Physical Anesthesia Plan  ASA: III  Anesthesia Plan: MAC   Post-op Pain Management:    Induction: Intravenous  Airway Management Planned: Simple Face Mask  Additional Equipment:   Intra-op Plan:   Post-operative Plan:   Informed Consent: I have reviewed the patients History and Physical, chart, labs and discussed the procedure including the risks, benefits and alternatives for the proposed anesthesia with the patient or authorized representative who has indicated his/her understanding and acceptance.   Dental advisory given  Plan Discussed with: CRNA and Anesthesiologist  Anesthesia Plan Comments:         Anesthesia Quick Evaluation

## 2017-01-22 NOTE — Transfer of Care (Signed)
Immediate Anesthesia Transfer of Care Note  Patient: [name] [name]  Procedure(s) Performed: Procedure(s): UPPER ENDOSCOPIC ULTRASOUND (EUS) RADIAL (N/A) ESOPHAGOGASTRODUODENOSCOPY (EGD) WITH PROPOFOL (N/A)  Patient Location: PACU  Anesthesia Type:MAC  Level of Consciousness:  sedated, patient cooperative and responds to stimulation  Airway & Oxygen Therapy:Patient Spontanous Breathing and Patient connected to face mask oxgen  Post-op Assessment:  Report given to PACU RN and Post -op Vital signs reviewed and stable  Post vital signs:  Reviewed and stable  Last Vitals:  Vitals:   01/22/17 1025 01/22/17 1208  BP: (!) 168/85 (!) 173/78  Pulse: 90   Resp: 20   Temp: 90.2 C     Complications: No apparent anesthesia complications

## 2017-01-22 NOTE — Op Note (Addendum)
[facility] Patient Name: [name] [name] Procedure Date: 01/22/2017 MRN: [id number] Attending MD: Arta Silence , MD Date of Birth: [birth year] CSN: [id number] Age: 83 Admit Type: Outpatient Procedure:                Upper EUS Indications:              Elevated liver enzymes, Suspected solid pancreatic                            neoplasm Providers:                Arta Silence, MD, Carolynn Comment, RN, Bijal Siglin Dalton, Technician Referring MD:              Medicines:                Monitored Anesthesia Care Complications:            No immediate complications. Estimated Blood Loss:     Estimated blood loss was minimal. Procedure:                Pre-Anesthesia Assessment:                           - Prior to the procedure, a History and Physical                            was performed, and patient medications and                            allergies were reviewed. The patient's tolerance of                            previous anesthesia was also reviewed. The risks                            and benefits of the procedure and the sedation                            options and risks were discussed with the patient.                            All questions were answered, and informed consent                            was obtained. Prior Anticoagulants: The patient has                            taken no previous anticoagulant or antiplatelet                            agents. ASA Grade Assessment: III - A patient with                            severe systemic disease.  After reviewing the risks                            and benefits, the patient was deemed in                            satisfactory condition to undergo the procedure.                           After obtaining informed consent, the endoscope was                            passed under direct vision. Throughout the                            procedure, the patient's blood  pressure, pulse, and                            oxygen saturations were monitored continuously. The                            [id number] ([id number]) scope was introduced through                            the mouth, and advanced to the duodenal bulb. The                            upper EUS was technically difficult and complex due                            to abnormal anatomy. The patient tolerated the                            procedure well. Scope In: Scope Out: Findings:      Endoscopic Finding :      A small hiatal hernia was present.      One mild benign-appearing, intrinsic stenosis was found.      Patchy mild inflammation characterized by adherent blood was found in       the gastric body and in the gastric antrum.      One partially obstructing oozing cratered duodenal ulcer with no       stigmata of bleeding was found in the duodenal bulb. The lesion was 12       mm in largest dimension. Biopsies were taken with a cold forceps for       histology. Stent seen protruding in region of bulb. Unable to pass EGD       scope distal to this area. Impression:               - Small hiatal hernia.                           - Benign-appearing esophageal stenosis.                           - Gastritis.                           -  One partially obstructing oozing duodenal ulcer                            with no stigmata of bleeding. Biopsied. Stent                            visible. Suspect malignant extension +/- fistulous                            erosion of stent into duodenal bulb. U Moderate Sedation:      N/A- Per Anesthesia Care Recommendation:           - Discharge patient to home (via wheelchair).                           - Soft diet indefinitely.                           - Continue present medications.                           - Await pathology results.                           - Return to GI clinic after studies are complete.                           - Return to  referring physician as previously                            scheduled. Procedure Code(s):        --- Professional ---                           [phone], Esophagogastroduodenoscopy, flexible,                            transoral; with biopsy, single or multiple Diagnosis Code(s):        --- Professional ---                           K44.9, Diaphragmatic hernia without obstruction or                            gangrene                           K22.2, Esophageal obstruction                           K29.70, Gastritis, unspecified, without bleeding                           K26.4, Chronic or unspecified duodenal ulcer with                            hemorrhage  R74.8, Abnormal levels of other serum enzymes CPT copyright 2016 [employer]. All rights reserved. The codes documented in this report are preliminary and upon coder review may  be revised to meet current compliance requirements. Arta Silence, MD 01/22/2017 12:34:36 PM This report has been signed electronically. Number of Addenda: 0

## 2017-01-22 NOTE — Anesthesia Postprocedure Evaluation (Signed)
Anesthesia Post Note  Patient: [name] [name]  Procedure(s) Performed: Procedure(s) (LRB): UPPER ENDOSCOPIC ULTRASOUND (EUS) RADIAL (N/A) ESOPHAGOGASTRODUODENOSCOPY (EGD) WITH PROPOFOL (N/A)  Patient location during evaluation: PACU Anesthesia Type: MAC Level of consciousness: awake Pain management: pain level controlled Respiratory status: spontaneous breathing Cardiovascular status: stable Anesthetic complications: no       Last Vitals:  Vitals:   01/22/17 1210 01/22/17 1220  BP:  (!) 177/87  Pulse: 63   Resp:    Temp:      Last Pain:  Vitals:   01/22/17 1208  TempSrc: Oral                 [name] [name]

## 2017-01-22 NOTE — Discharge Instructions (Signed)
Endoscopy °Care After °Please read the instructions outlined below and refer to this sheet in the next few weeks. These discharge instructions provide you with general information on caring for yourself after you leave the hospital. Your doctor may also give you specific instructions. While your treatment has been planned according to the most current medical practices available, unavoidable complications occasionally occur. If you have any problems or questions after discharge, please call Dr. Eran Windish ([facility]) at [phone]. ° °HOME CARE INSTRUCTIONS °Activity °· You may resume your regular activity but move at a slower pace for the next 24 hours.  °· Take frequent rest periods for the next 24 hours.  °· Walking will help expel (get rid of) the air and reduce the bloated feeling in your abdomen.  °· No driving for 24 hours (because of the anesthesia (medicine) used during the test).  °· You may shower.  °· Do not sign any important legal documents or operate any machinery for 24 hours (because of the anesthesia used during the test).  °Nutrition °· Drink plenty of fluids.  °· You may resume your normal diet.  °· Begin with a light meal and progress to your normal diet.  °· Avoid alcoholic beverages for 24 hours or as instructed by your caregiver.  °Medications °You may resume your normal medications unless your caregiver tells you otherwise. °What you can expect today °· You may experience abdominal discomfort such as a feeling of fullness or "gas" pains.  °· You may experience a sore throat for 2 to 3 days. This is normal. Gargling with salt water may help this.  °·  °SEEK IMMEDIATE MEDICAL CARE IF: °· You have excessive nausea (feeling sick to your stomach) and/or vomiting.  °· You have severe abdominal pain and distention (swelling).  °· You have trouble swallowing.  °· You have a temperature over 100° F (37.8° C).  °· You have rectal bleeding or vomiting of blood.  °Document Released:  05/07/2004 Document Revised: 06/05/2011 Document Reviewed: 11/18/2007 °Exit[employer]® Patient Information ©2012 [employer]. °

## 2017-01-23 ENCOUNTER — Encounter (HOSPITAL_COMMUNITY): Payer: Self-pay | Admitting: Gastroenterology

## 2017-01-27 DIAGNOSIS — R269 Unspecified abnormalities of gait and mobility: Secondary | ICD-10-CM | POA: Diagnosis not present

## 2017-01-27 DIAGNOSIS — M6281 Muscle weakness (generalized): Secondary | ICD-10-CM | POA: Diagnosis not present

## 2017-01-30 ENCOUNTER — Ambulatory Visit (INDEPENDENT_AMBULATORY_CARE_PROVIDER_SITE_OTHER): Payer: Medicare Other

## 2017-01-30 ENCOUNTER — Ambulatory Visit (HOSPITAL_COMMUNITY)
Admission: EM | Admit: 2017-01-30 | Discharge: 2017-01-30 | Disposition: A | Discharge status: Home / Self Care | Payer: Medicare Other | Attending: Family Medicine | Admitting: Family Medicine

## 2017-01-30 ENCOUNTER — Encounter (HOSPITAL_COMMUNITY): Payer: Self-pay | Admitting: Emergency Medicine

## 2017-01-30 DIAGNOSIS — S51012A Laceration without foreign body of left elbow, initial encounter: Secondary | ICD-10-CM | POA: Diagnosis not present

## 2017-01-30 DIAGNOSIS — S3993XA Unspecified injury of pelvis, initial encounter: Secondary | ICD-10-CM | POA: Diagnosis not present

## 2017-01-30 DIAGNOSIS — S300XXA Contusion of lower back and pelvis, initial encounter: Secondary | ICD-10-CM | POA: Diagnosis not present

## 2017-01-30 DIAGNOSIS — M533 Sacrococcygeal disorders, not elsewhere classified: Secondary | ICD-10-CM | POA: Diagnosis not present

## 2017-01-30 DIAGNOSIS — S61412A Laceration without foreign body of left hand, initial encounter: Secondary | ICD-10-CM

## 2017-01-30 MED ORDER — KETOROLAC TROMETHAMINE 30 MG/ML IJ SOLN
INTRAMUSCULAR | Status: AC
  Filled 2017-01-30: qty 1

## 2017-01-30 MED ORDER — KETOROLAC TROMETHAMINE 30 MG/ML IJ SOLN
30.0000 mg | Freq: Once | INTRAMUSCULAR | Status: AC
  Administered 2017-01-30: 30 mg via INTRAMUSCULAR

## 2017-01-30 NOTE — ED Provider Notes (Signed)
CSN: [id number]     Arrival date & time 01/30/17  1334 History   First MD Initiated Contact with Patient 01/30/17 1413     Chief Complaint  Patient presents with  . Fall   (Consider location/radiation/quality/duration/timing/severity/associated sxs/prior Treatment) Patient c/o fall and buttock sacrum pain.  Patient c/o cuts and abrasions on left hand and left elbow.   The history is provided by the patient.  Fall  This is a new problem. The problem occurs constantly. The problem has not changed since onset.Nothing aggravates the symptoms. Nothing relieves the symptoms. He has tried nothing for the symptoms.    Past Medical History:  Diagnosis Date  . Afebrile   . Anemia    none recent  . Chronic renal insufficiency    followed by primary md  . Colitis   . Dyslipidemia   . Dysphagia    now resolved  . ED (erectile dysfunction)   . Edema of extremities    improved now  . GERD (gastroesophageal reflux disease)   . Hiatus hernia syndrome   . Hypertension     off bp meds for last few months  . Inguinal hernia   . Microhematuria   . Onychomycosis   . Osteoporosis   . Pancreatic mass   . Pleural effusion   . Pneumonia 11/22/2016  . Prostate cancer ([place]) 15 yrs ago  . Shingles   . Syncope 2012   Past Surgical History:  Procedure Laterality Date  . ERCP N/A 09/06/2015   Procedure: ENDOSCOPIC RETROGRADE CHOLANGIOPANCREATOGRAPHY (ERCP);  Surgeon: Arta Silence, MD;  Location: [facility];  Service: Endoscopy;  Laterality: N/A;  . ERCP N/A 12/08/2016   Procedure: ENDOSCOPIC RETROGRADE CHOLANGIOPANCREATOGRAPHY (ERCP);  Surgeon: Arta Silence, MD;  Location: [facility] ENDOSCOPY;  Service: Endoscopy;  Laterality: N/A;  . ESOPHAGOGASTRODUODENOSCOPY (EGD) WITH PROPOFOL N/A 01/22/2017   Procedure: ESOPHAGOGASTRODUODENOSCOPY (EGD) WITH PROPOFOL;  Surgeon: Arta Silence, MD;  Location: [facility] ENDOSCOPY;  Service: Endoscopy;  Laterality: N/A;  . EUS Left 09/06/2015   Procedure: UPPER  ENDOSCOPIC ULTRASOUND (EUS) LINEAR;  Surgeon: Arta Silence, MD;  Location: [facility] ENDOSCOPY;  Service: Endoscopy;  Laterality: Left;  . EUS N/A 01/22/2017   Procedure: UPPER ENDOSCOPIC ULTRASOUND (EUS) RADIAL;  Surgeon: Arta Silence, MD;  Location: [facility] ENDOSCOPY;  Service: Endoscopy;  Laterality: N/A;  . HERNIA REPAIR    . TRANSPERINEAL IMPLANT OF RADIATION SEEDS W/ ULTRASOUND  2001  . varicsoe vein surgery age 15's     Family History  Problem Relation Age of Onset  . Throat cancer Mother   . Lung cancer Brother   . Bone cancer Brother    Social History  Substance Use Topics  . Smoking status: Former Smoker    Quit date: 10/07/1952  . Smokeless tobacco: Never Used  . Alcohol use No    Review of Systems  Constitutional: Negative.   HENT: Negative.   Eyes: Negative.   Respiratory: Negative.   Cardiovascular: Negative.   Gastrointestinal: Negative.   Endocrine: Negative.   Genitourinary: Negative.   Musculoskeletal: Positive for arthralgias and back pain.  Allergic/Immunologic: Negative.   Neurological: Negative.   Hematological: Negative.   Psychiatric/Behavioral: Negative.     Allergies  Penicillins and Oxycontin [oxycodone hcl]  Home Medications   Prior to Admission medications   Medication Sig Start Date End Date Taking? Authorizing Provider  acetaminophen (TYLENOL) 500 MG tablet Take 1,000 mg by mouth every 6 (six) hours as needed for mild pain, moderate pain or headache.     Historical Provider,  MD  Calcium Carbonate-Vitamin D (CALCIUM 600+D PO) Take 1 tablet by mouth daily.    Historical Provider, MD  feeding supplement, ENSURE ENLIVE, (ENSURE ENLIVE) LIQD Take 237 mLs by mouth 2 (two) times daily between meals. Patient taking differently: Take 237 mLs by mouth daily.  12/13/16   Maryann Mikhail, DO  folic acid (FOLVITE) 1 MG tablet Take 1 tablet (1 mg total) by mouth daily. 11/28/16   Oswald Hillock, MD  Menthol, Topical Analgesic, (BENGAY EX) Apply 1 application  topically daily as needed (pain).    Historical Provider, MD  mirabegron ER (MYRBETRIQ) 50 MG TB24 tablet Take 50 mg by mouth daily.    Historical Provider, MD  Multiple Vitamin (MULTIVITAMIN WITH MINERALS) TABS tablet Take 1 tablet by mouth daily.    Historical Provider, MD  Multiple Vitamins-Minerals (PRESERVISION AREDS 2) CAPS Take 1 capsule by mouth daily.    Historical Provider, MD  omeprazole (PRILOSEC) 40 MG capsule Take 40 mg by mouth daily.    Historical Provider, MD  Polyethyl Glycol-Propyl Glycol (SYSTANE) 0.4-0.3 % SOLN Place 1 drop into both eyes daily as needed (dry eyes).     Historical Provider, MD  thiamine 100 MG tablet Take 1 tablet (100 mg total) by mouth daily. 11/28/16   Oswald Hillock, MD  traMADol (ULTRAM) 50 MG tablet Take 1 tablet (50 mg total) by mouth every 6 (six) hours as needed for moderate pain. 12/13/16   Maryann Mikhail, DO   Meds Ordered and Administered this Visit   Medications  ketorolac (TORADOL) 30 MG/ML injection 30 mg (30 mg Intramuscular Given 01/30/17 1548)    BP 133/85 (BP Location: Left Arm)   Pulse 81   Temp 97.7 F (36.5 C) (Oral)   Resp 14   SpO2 100%  No data found.   Physical Exam  Constitutional: He is oriented to person, place, and time. He appears well-developed and well-nourished.  HENT:  Head: Normocephalic and atraumatic.  Right Ear: External ear normal.  Left Ear: External ear normal.  Mouth/Throat: Oropharynx is clear and moist.  Eyes: Conjunctivae and EOM are normal. Pupils are equal, round, and reactive to light.  Neck: Normal range of motion. Neck supple.  Cardiovascular: Normal rate and regular rhythm.   Pulmonary/Chest: Effort normal and breath sounds normal.  Abdominal: Soft.  Musculoskeletal: Normal range of motion.  Neurological: He is alert and oriented to person, place, and time.  Skin:  Skin tear left hand and skin tear left elbow.  Nursing note and vitals reviewed.   Urgent Care Course     .Marland KitchenLaceration  Repair Date/Time: 01/30/2017 3:57 PM Performed by: Lysbeth Penner Authorized by: Robyn Haber   Consent:    Consent obtained:  Verbal   Consent given by:  Patient   Risks discussed:  Pain   Alternatives discussed:  No treatment Anesthesia (see MAR for exact dosages):    Anesthesia method:  None Laceration details:    Location:  Hand   Length (cm):  3 Repair type:    Repair type:  Simple Exploration:    Hemostasis achieved with:  Direct pressure   Contaminated: no   Treatment:    Area cleansed with:  Betadine and saline   Amount of cleaning:  Standard   Irrigation solution:  Sterile saline Skin repair:    Repair method:  Tissue adhesive Approximation:    Approximation:  Loose   Vermilion border: well-aligned   Comments:     Wound adhesive applied to skin tear  left elbow and left hand   (including critical care time)  Labs Review Labs Reviewed - No data to display  Imaging Review Dg Pelvis 1-2 Views  Result Date: 01/30/2017 CLINICAL DATA:  Recent fall landing on the buttocks with sacrococcygeal pain EXAM: PELVIS - 1-2 VIEW COMPARISON:  PET-CT of 10/24/2015 FINDINGS: The bones are somewhat osteopenic. No acute fracture is seen. Prostate implant seeds are noted. The SI joints are corticated. IMPRESSION: No fracture.  Mild osteopenia. Electronically Signed   By: Ivar Drape M.D.   On: 01/30/2017 14:51   Dg Sacrum/coccyx  Result Date: 01/30/2017 CLINICAL DATA:  [place] 2 days ago with pain in the sacrum and coccyx EXAM: SACRUM AND COCCYX - 2+ VIEW COMPARISON:  None. FINDINGS: The sacrococcygeal elements are normally aligned in somewhat osteopenic. No acute fracture is seen. The pelvic rami are intact. Prostate implant seeds are noted. IMPRESSION: No fracture.  Osteopenia. Electronically Signed   By: Ivar Drape M.D.   On: 01/30/2017 14:50     Visual Acuity Review  Right Eye Distance:   Left Eye Distance:   Bilateral Distance:    Right Eye Near:   Left Eye Near:     Bilateral Near:         MDM   1. Contusion of coccyx, initial encounter   2. Skin tear of left elbow without complication, initial encounter   3. Skin tear of left hand without complication, initial encounter    Skin adhesive applied to elbow and left hand Toradol 30mg  IM      Lysbeth Penner, FNP 01/30/17 1559

## 2017-01-30 NOTE — ED Triage Notes (Signed)
PT reports he fell directly on his bottom while trying to get out of the car on Tuesday. PT reports a lot of pain when trying to walk/move. PT reports severe tailbone pain while moving.

## 2017-01-31 DIAGNOSIS — K869 Disease of pancreas, unspecified: Secondary | ICD-10-CM | POA: Diagnosis not present

## 2017-02-03 ENCOUNTER — Encounter: Payer: Self-pay | Admitting: Oncology

## 2017-02-04 ENCOUNTER — Encounter: Payer: Self-pay | Admitting: Hematology

## 2017-02-04 ENCOUNTER — Ambulatory Visit (HOSPITAL_BASED_OUTPATIENT_CLINIC_OR_DEPARTMENT_OTHER): Payer: Medicare Other | Admitting: Hematology

## 2017-02-04 VITALS — BP 130/73 | HR 89 | Temp 98.7°F | Resp 17 | Ht 69.0 in

## 2017-02-04 DIAGNOSIS — K8689 Other specified diseases of pancreas: Secondary | ICD-10-CM

## 2017-02-04 DIAGNOSIS — C250 Malignant neoplasm of head of pancreas: Secondary | ICD-10-CM | POA: Diagnosis not present

## 2017-02-04 MED ORDER — OXYCODONE-ACETAMINOPHEN 5-325 MG PO TABS
1.0000 | ORAL_TABLET | Freq: Once | ORAL | Status: AC
  Administered 2017-02-04: 1 via ORAL

## 2017-02-04 MED ORDER — HYDROCODONE-ACETAMINOPHEN 5-325 MG PO TABS: 1.0000 | ORAL_TABLET | Freq: Four times a day (QID) | ORAL | 0 refills | Status: AC | PRN

## 2017-02-04 MED ORDER — OXYCODONE-ACETAMINOPHEN 5-325 MG PO TABS
ORAL_TABLET | ORAL | Status: AC
  Filled 2017-02-04: qty 1

## 2017-02-04 NOTE — Progress Notes (Signed)
[name] [name]  [phone] Fax:([phone]) [place] Note   Patient Care Team: Lorne Skeens, MD as PCP - General (Endocrinology) 02/04/2017  Referring physician: Dr. Paulita Fujita  CHIEF COMPLAINTS/PURPOSE OF CONSULTATION:  Newly diagnosed pancreatic adenocarcinoma  HISTORY OF PRESENTING ILLNESS: 02/04/17 [name] [name] 83 y.o. male is here because of newly diagnosed pancreatic adenocarcinoma. He was referred by his gastroenterologist Dr. Paulita Fujita. He presents to my clinic with his wife and daughter.  He initially presented with painless jaundice in November 2016, status post ERCP and CBD stent placement. His imaging, EUS and CBD brushing was negative for malignancy. His jaundice resolved after stent placement, and he did well afterwards.  On 11/22/16 he presented to the ED with a epigastric pain with intermitted nausea and vomiting and moderate weight loss. ABD CT done 11/22/16 showed pancreas appearing masslike. He was discharged from the ED 11/27/16.   Patient was admitted to ED again 12/06/16-12/13/16 for worsening [place] and abdominal pain. He was admitted for acute chondritis. Dr. Paulita Fujita performed an ERCP and CBD stent exchange, and he was treated with antibiotics. His symptoms improved. After his hospital discharge, Dr. Paulita Fujita did EUS which showed a ulcerated mass in the duodenum bulb, and a biopsy showed atypical glands suspicious for adenocarcinoma on 01/22/17.  He presented to the ED for a fall on 01/30/17 that resulted in a contusion of coccyx. He has been having unbearable pain in the tailbone area, especially when he changes position. He has not been able to walk much since the fall. He was given a prescription of tramadol, which did not help much with his pain. He has not been able to sleep much due to the pain.  He has a history of HTN (stable according to daughter), GERD, Dementia, HOH, prostate cancer and biliary obstruction requiring CBD stent in  November 2016 which was replaced in 2018.   Today he presents to the clinic with his wife and daughter. He reports that he fell from the wet ground. He can not walk or sit up much due to severe pain on his buttock. He was giving tramadol and tylenol to help but he reports it is "not touching his pain". He reports to be allergic to oxycodone which his daughter says he will get confused from. Other ointments have not worked either. Pain is only on his buttock. Before the fall his daughter reports the patient having back pain but the patient believes he had no pain. He reports he wears Depends for bladder incontinence and recently a bowl incontinence. The daughter reports his 25+ weight loss since October. He denies current fever. He lives with his wife in their own home. They have nurses hired at night and the daughter helps during the day. Since the fall he moves only with in the room. Daughter reports use of doughnut seat, pain creme and ice to aide with pain. He reports Dr. Paulita Fujita did not want to operate because of other issues. The daughter reports that before fall he was more mobile and able to drive at times. His wife has dementia and requires 24 hour supervision. He has a history of smoking. And a family history of cancer.   MEDICAL HISTORY:  Past Medical History:  Diagnosis Date  . Afebrile   . Anemia    none recent  . Chronic renal insufficiency    followed by primary md  . Colitis   . Dyslipidemia   . Dysphagia    now resolved  .  ED (erectile dysfunction)   . Edema of extremities    improved now  . GERD (gastroesophageal reflux disease)   . Hiatus hernia syndrome   . Hypertension     off bp meds for last few months  . Inguinal hernia   . Microhematuria   . Onychomycosis   . Osteoporosis   . Pancreatic mass   . Pleural effusion   . Pneumonia 11/22/2016  . Prostate cancer ([place]) 15 yrs ago  . Shingles   . Syncope 2012    SURGICAL HISTORY: Past Surgical History:  Procedure  Laterality Date  . ERCP N/A 09/06/2015   Procedure: ENDOSCOPIC RETROGRADE CHOLANGIOPANCREATOGRAPHY (ERCP);  Surgeon: Arta Silence, MD;  Location: [facility];  Service: Endoscopy;  Laterality: N/A;  . ERCP N/A 12/08/2016   Procedure: ENDOSCOPIC RETROGRADE CHOLANGIOPANCREATOGRAPHY (ERCP);  Surgeon: Arta Silence, MD;  Location: [facility] ENDOSCOPY;  Service: Endoscopy;  Laterality: N/A;  . ESOPHAGOGASTRODUODENOSCOPY (EGD) WITH PROPOFOL N/A 01/22/2017   Procedure: ESOPHAGOGASTRODUODENOSCOPY (EGD) WITH PROPOFOL;  Surgeon: Arta Silence, MD;  Location: [facility] ENDOSCOPY;  Service: Endoscopy;  Laterality: N/A;  . EUS Left 09/06/2015   Procedure: UPPER ENDOSCOPIC ULTRASOUND (EUS) LINEAR;  Surgeon: Arta Silence, MD;  Location: [facility] ENDOSCOPY;  Service: Endoscopy;  Laterality: Left;  . EUS N/A 01/22/2017   Procedure: UPPER ENDOSCOPIC ULTRASOUND (EUS) RADIAL;  Surgeon: Arta Silence, MD;  Location: [facility] ENDOSCOPY;  Service: Endoscopy;  Laterality: N/A;  . HERNIA REPAIR    . TRANSPERINEAL IMPLANT OF RADIATION SEEDS W/ ULTRASOUND  2001  . varicsoe vein surgery age 88's      SOCIAL HISTORY: Social History   Social History  . Marital status: Married    Spouse name: N/A  . Number of children: N/A  . Years of education: N/A   Occupational History  . Not on file.   Social History Main Topics  . Smoking status: Former Smoker    Quit date: 10/07/1952  . Smokeless tobacco: Never Used  . Alcohol use No  . Drug use: No  . Sexual activity: Not on file   Other Topics Concern  . Not on file   Social History Narrative  . No narrative on file    FAMILY HISTORY: Family History  Problem Relation Age of Onset  . Throat cancer Mother   . Lung cancer Brother   . Bone cancer Brother     ALLERGIES:  is allergic to penicillins and oxycontin [oxycodone hcl].  MEDICATIONS:  Current Outpatient Prescriptions  Medication Sig Dispense Refill  . acetaminophen (TYLENOL) 500 MG tablet Take 1,000 mg by mouth every 6  (six) hours as needed for mild pain, moderate pain or headache.     . Calcium Carbonate-Vitamin D (CALCIUM 600+D PO) Take 1 tablet by mouth daily.    . feeding supplement, ENSURE ENLIVE, (ENSURE ENLIVE) LIQD Take 237 mLs by mouth 2 (two) times daily between meals. (Patient taking differently: Take 237 mLs by mouth daily. ) 60 Bottle 0  . folic acid (FOLVITE) 1 MG tablet Take 1 tablet (1 mg total) by mouth daily. 30 tablet 2  . Menthol, Topical Analgesic, (BENGAY EX) Apply 1 application topically daily as needed (pain).    . mirabegron ER (MYRBETRIQ) 50 MG TB24 tablet Take 50 mg by mouth daily.    . Multiple Vitamin (MULTIVITAMIN WITH MINERALS) TABS tablet Take 1 tablet by mouth daily.    . Multiple Vitamins-Minerals (PRESERVISION AREDS 2) CAPS Take 1 capsule by mouth daily.    Marland Kitchen omeprazole (PRILOSEC) 40 MG capsule Take  40 mg by mouth daily.    Vladimir Faster Glycol-Propyl Glycol (SYSTANE) 0.4-0.3 % SOLN Place 1 drop into both eyes daily as needed (dry eyes).     . thiamine 100 MG tablet Take 1 tablet (100 mg total) by mouth daily. 30 tablet 2  . traMADol (ULTRAM) 50 MG tablet Take 1 tablet (50 mg total) by mouth every 6 (six) hours as needed for moderate pain. 30 tablet 0   No current facility-administered medications for this visit.     REVIEW OF SYSTEMS:   Constitutional: Denies fevers, chills or abnormal night sweats (+) weight loss Eyes: Denies blurriness of vision, double vision or watery eyes Ears, nose, mouth, throat, and face: Denies mucositis or sore throat (+) HOH Respiratory: Denies cough, dyspnea or wheezes Cardiovascular: Denies palpitation, chest discomfort or lower extremity swelling Gastrointestinal:  Denies nausea, heartburn (+) bladder incontinence and bowl incontinence   Skin: Denies abnormal skin rashes Lymphatics: Denies new lymphadenopathy or easy bruising Neurological: Denies numbness, tingling or new weaknesses (+) confusion due to age Musculoskeletal: (+) sever buttock  pain due to fall (+) lower back pain prior to fall Behavioral/Psych: Mood is stable, no new changes  All other systems were reviewed with the patient and are negative.  PHYSICAL EXAMINATION: ECOG PERFORMANCE STATUS: 4 - Bedbound  Vitals:   02/04/17 1454  BP: 130/73  Pulse: 89  Resp: 17  Temp: 98.7 F (37.1 C)   Filed Weights    GENERAL:alert, no distress and comfortable SKIN: skin color, texture, turgor are normal, no rashes or significant lesions EYES: normal, conjunctiva are pink and non-injected, sclera clear OROPHARYNX:no exudate, no erythema and lips, buccal mucosa, and tongue normal  NECK: supple, thyroid normal size, non-tender, without nodularity LYMPH:  no palpable lymphadenopathy in the cervical, axillary or inguinal LUNGS: clear to auscultation and percussion with normal breathing effort HEART: regular rate & rhythm and no murmurs and no lower extremity edema ABDOMEN:abdomen soft, non-tender and normal bowel sounds Musculoskeletal:no cyanosis of digits and no clubbing (+) slight erythema on buttock from fall and no ulcer present PSYCH: alert & oriented x 3 with fluent speech NEURO: no focal motor/sensory deficits  LABORATORY DATA:  I have reviewed the data as listed CBC Latest Ref Rng & Units 12/13/2016 12/12/2016 12/11/2016  WBC 4.0 - 10.5 K/uL 8.2 8.1 7.1  Hemoglobin 13.0 - 17.0 g/dL 7.9(L) 7.9(L) 7.7(L)  Hematocrit 39.0 - 52.0 % 23.7(L) 23.4(L) 22.5(L)  Platelets 150 - 400 K/uL 287 273 259    CMP Latest Ref Rng & Units 12/13/2016 12/12/2016 12/11/2016  Glucose 65 - 99 mg/dL 105(H) 106(H) 112(H)  BUN 6 - 20 mg/dL 7 9 10   Creatinine 0.61 - 1.24 mg/dL 1.64(H) 1.56(H) 1.44(H)  Sodium 135 - 145 mmol/L 134(L) 134(L) 129(L)  Potassium 3.5 - 5.1 mmol/L 4.1 3.9 3.4(L)  Chloride 101 - 111 mmol/L 98(L) 101 94(L)  CO2 22 - 32 mmol/L 26 27 24   Calcium 8.9 - 10.3 mg/dL 8.4(L) 8.1(L) 8.1(L)  Total Protein 6.5 - 8.1 g/dL 5.8(L) 5.5(L) 4.8(L)  Total Bilirubin 0.3 - 1.2 mg/dL 8.6(H)  9.2(H) 9.9(H)  Alkaline Phos 38 - 126 U/L 850(H) 795(H) 742(H)  AST 15 - 41 U/L 132(H) 137(H) 161(H)  ALT 17 - 63 U/L 94(H) 96(H) 102(H)     PATHOLOGY: Surgical: 01/22/17 Diagnosis Duodenum, Biopsy - FOCAL ATYPICAL GLANDS SUSPICIOUS FOR ADENOCARCINOMA. - SEE MICROSCOPIC DESCRIPTION. Microscopic Comment There are multiple fragments of duodenal mucosa one of which has atypical glands characterized by architectural irregularity, nuclear  enlargement with stratification and increased mitoses. While these findings are suspicious for adenocarcinoma, they are limited in extent in these biopsies. Dr. Gari Crown has reviewed this case and agrees.  PROCEDURES:  Upper Endoscopic [place] 01/22/17 Findings: A small hiatal hernia was present. One mild benign-appearing, intrinsic stenosis was found. Patchy mild inflammation characterized by adherent blood was found in the gastric body and in the gastric antrum. One partially obstructing oozing cratered duodenal ulcer with no stigmata of bleeding was found in the duodenal bulb. The lesion was 12 mm in largest dimension. Biopsies were taken with a cold forceps for histology. Stent seen protruding in region of bulb. Unable to pass EGD scope distal to this area. - Small hiatal hernia. - Benign-appearing esophageal stenosis. - Gastritis. - One partially obstructing oozing duodenal ulcer with no stigmata of bleeding. Biopsied. Stent visible. Suspect malignant extension +/- fistulous erosion of stent into duodenal bulb.  RADIOGRAPHIC STUDIES: I have personally reviewed the radiological images as listed and agreed with the findings in the report. Dg Pelvis 1-2 Views  Result Date: 01/30/2017 CLINICAL DATA:  Recent fall landing on the buttocks with sacrococcygeal pain EXAM: PELVIS - 1-2 VIEW COMPARISON:  PET-CT of 10/24/2015 FINDINGS: The bones are somewhat osteopenic. No acute fracture is seen. Prostate implant seeds are noted. The SI joints are corticated.  IMPRESSION: No fracture.  Mild osteopenia. Electronically Signed   By: Ivar Drape M.D.   On: 01/30/2017 14:51   Dg Sacrum/coccyx  Result Date: 01/30/2017 CLINICAL DATA:  [place] 2 days ago with pain in the sacrum and coccyx EXAM: SACRUM AND COCCYX - 2+ VIEW COMPARISON:  None. FINDINGS: The sacrococcygeal elements are normally aligned in somewhat osteopenic. No acute fracture is seen. The pelvic rami are intact. Prostate implant seeds are noted. IMPRESSION: No fracture.  Osteopenia. Electronically Signed   By: Ivar Drape M.D.   On: 01/30/2017 14:50   Dg Chest Port 1 View  Result Date: 01/22/2017 CLINICAL DATA:  Status post ERCP EXAM: PORTABLE CHEST 1 VIEW COMPARISON:  11/22/2016 FINDINGS: Cardiac shadow is stable. Aortic calcifications are again noted. The lungs are well aerated bilaterally. No focal infiltrate or sizable effusion is seen. Old rib fractures are noted on the right. Scoliosis concave to the left is again noted. IMPRESSION: No acute abnormality mid noted. Electronically Signed   By: Inez Catalina M.D.   On: 01/22/2017 11:07    ASSESSMENT & PLAN:  [name] [name] is a 83 y.o. male presenting to the clinic with  focal atypical glands suspicious for adenocarcinoma  1. Pancreatic cancer in the head of the pancreas, Adenocarcinoma, cT2N0M0 stage IB -I reviewed his imaging findings, cytology, and a recent duodenal ulcer biopsy results with patient and his family members. -From the CT scan, PET scan, endoscopy finding, and the biopsy results, this is most consistent with pancreatic head adenocarcinoma, no distant metastasis on the scan. He is status post CBD stent placement -He initially presented with painless jaundice about 18 months ago, no pericardial mass was noticed on the CT scan has that time. However that was likely related to his pancreatic cancer. His disease appears to be relatively indolent.  -Although he has early-stage disease, due to the location, it will require Whipple  surgery. Due to his advanced age, comorbidities, he is probably not a candidate for surgery. -Due to his advanced age and limited performance status, I do not think he can tolerate chemotherapy, which is palliative in nature. -We discussed the option of palliative radiation. I'm concerned  his pancreatic head tumor is invading the duodenum, which can cause bowel obstruction, bleeding, and potential perforation. I think palliative radiation will be beneficial. -I'll present his case in our GI tumor board to see if my surgical and radiation oncology colleagues would agree with me.  -alternatively, he would be appropriate for hospice, since this cancer is not curable. We discussed hospice care with patient and his family members. -After lengthy discussion, patient and his daughter would like to think about the above options, and let me know if he would like to proceed with palliative radiation, versus hospice.   2. Contusion of Coccyx due to fall  -Patient fell on wet ground and received a contusion of their coccyx that was evaluated by the ED -He is in sever pain to where he is barely able to stand and move around.  -I ordered Vicodin today for pain (5/325 mg) - 1 every 6 hours as needed  3. Goal of care discussion  -We discussed the incurable nature of his cancer along with his age making him ineligible for surgery or chemo therapy.  -The patient understands the goal of care is palliative. -I have recommended DNR/DNI, the patient and his daughter will discuss and decide.   PLAN: -Ordered Vicodin for pain (5/325 mg) - 1 every 6hr as needed -His daughter will call me if they decide to pursue palliative radiation -f/u in 3-4 weeks   No orders of the defined types were placed in this encounter.   All questions were answered. The patient knows to call the clinic with any problems, questions or concerns. I spent 55 minutes counseling the patient face to face. The total time spent in the  appointment was 60 minutes and more than 50% was on counseling.   This document serves as a record of services personally performed by Truitt Merle, MD. It was created on her behalf by Joslyn Devon, a trained medical scribe. The creation of this record is based on the scribe's personal observations and the provider's statements to them. This document has been checked and approved by the attending provider.     Truitt Merle, MD 02/04/2017   Addendum I presented his case in our GI tumor Board today. Dr. Barry Dienes does not think he is a candidate for Whipple surgery. Dr. Lisbeth Renshaw offered palliative radiation.   Patient's daughter called me back today, and her request hospice referral. I'll set up his prosperous referral. I called her back, and left a message regarding our tumor board recommendations. She knows to call me if pt wants to consider palliative radiation.  Truitt Merle  02/05/2017

## 2017-02-05 ENCOUNTER — Encounter: Payer: Self-pay | Admitting: Hematology

## 2017-02-05 ENCOUNTER — Telehealth: Payer: Self-pay | Admitting: *Deleted

## 2017-02-05 DIAGNOSIS — C259 Malignant neoplasm of pancreas, unspecified: Secondary | ICD-10-CM | POA: Insufficient documentation

## 2017-02-05 NOTE — Telephone Encounter (Signed)
Received vm call from [facility] asking if pt is appropriate referral & if so, will Dr Burr Medico be attending.  Pt's daughter, Lattie Haw had called in referral.  Left message for [facility] this pm that pt is appropriate per Dr Burr Medico & would be attending & she prefers symptom management per [facility].  Dr Burr Medico will also call & discuss with family.

## 2017-02-06 ENCOUNTER — Telehealth: Payer: Self-pay | Admitting: Hematology

## 2017-02-06 DIAGNOSIS — M545 Low back pain: Secondary | ICD-10-CM | POA: Diagnosis not present

## 2017-02-06 DIAGNOSIS — D630 Anemia in neoplastic disease: Secondary | ICD-10-CM | POA: Diagnosis not present

## 2017-02-06 DIAGNOSIS — K219 Gastro-esophageal reflux disease without esophagitis: Secondary | ICD-10-CM | POA: Diagnosis not present

## 2017-02-06 DIAGNOSIS — E785 Hyperlipidemia, unspecified: Secondary | ICD-10-CM | POA: Diagnosis not present

## 2017-02-06 DIAGNOSIS — K449 Diaphragmatic hernia without obstruction or gangrene: Secondary | ICD-10-CM | POA: Diagnosis not present

## 2017-02-06 DIAGNOSIS — K831 Obstruction of bile duct: Secondary | ICD-10-CM | POA: Diagnosis not present

## 2017-02-06 DIAGNOSIS — M810 Age-related osteoporosis without current pathological fracture: Secondary | ICD-10-CM | POA: Diagnosis not present

## 2017-02-06 DIAGNOSIS — N183 Chronic kidney disease, stage 3 (moderate): Secondary | ICD-10-CM | POA: Diagnosis not present

## 2017-02-06 DIAGNOSIS — C259 Malignant neoplasm of pancreas, unspecified: Secondary | ICD-10-CM | POA: Diagnosis not present

## 2017-02-06 DIAGNOSIS — R634 Abnormal weight loss: Secondary | ICD-10-CM | POA: Diagnosis not present

## 2017-02-06 DIAGNOSIS — I10 Essential (primary) hypertension: Secondary | ICD-10-CM | POA: Diagnosis not present

## 2017-02-06 NOTE — Telephone Encounter (Signed)
I called his daughter Lattie Haw and discussed the tumor board recommendation of palliative radiation. Pt and her daughter has called hospice yesterday and plan to meet them soon to manage his severe sacral pain. He can use Nocor 1-2 tab every 6 hrs as needed, and titrate further if needed. Lattie Haw will call us when his pain is better controlled, and he is ready to try radiation. I will hold on his referral for now until she calls [place]. Lattie Haw appreciated the call.  Truitt Merle  02/06/2017

## 2017-02-07 ENCOUNTER — Telehealth: Payer: Self-pay | Admitting: Hematology

## 2017-02-07 ENCOUNTER — Telehealth: Payer: Self-pay | Admitting: *Deleted

## 2017-02-07 NOTE — Telephone Encounter (Signed)
Patient bypassed scheduling area on 02/04/17. Appointment scheduled per 02/07/17 los.   Patient was mailed a copy of the appointment schedule and  appointment letter.

## 2017-02-07 NOTE — Telephone Encounter (Signed)
Received call from [name] stating pt is in pain & is taking hydrocodone.  She wants to know if Pt  can take every 4 hours instead of every 6.  He has been taking 1-2 every 6 hours.  Informed OK to try q 4 hours but now less often.  This would be close to max total 24 hour dose of tylenol..  If this doesn't help, will need to call back.  [name] [name] expresss understanding.  Message routed to Dr Feng/Pod RN

## 2017-02-08 DIAGNOSIS — N183 Chronic kidney disease, stage 3 (moderate): Secondary | ICD-10-CM | POA: Diagnosis not present

## 2017-02-08 DIAGNOSIS — M545 Low back pain: Secondary | ICD-10-CM | POA: Diagnosis not present

## 2017-02-08 DIAGNOSIS — R634 Abnormal weight loss: Secondary | ICD-10-CM | POA: Diagnosis not present

## 2017-02-08 DIAGNOSIS — C259 Malignant neoplasm of pancreas, unspecified: Secondary | ICD-10-CM | POA: Diagnosis not present

## 2017-02-08 DIAGNOSIS — K831 Obstruction of bile duct: Secondary | ICD-10-CM | POA: Diagnosis not present

## 2017-02-08 DIAGNOSIS — D630 Anemia in neoplastic disease: Secondary | ICD-10-CM | POA: Diagnosis not present

## 2017-02-10 ENCOUNTER — Ambulatory Visit: Payer: Medicare Other | Admitting: Oncology

## 2017-02-10 DIAGNOSIS — D630 Anemia in neoplastic disease: Secondary | ICD-10-CM | POA: Diagnosis not present

## 2017-02-10 DIAGNOSIS — R634 Abnormal weight loss: Secondary | ICD-10-CM | POA: Diagnosis not present

## 2017-02-10 DIAGNOSIS — N183 Chronic kidney disease, stage 3 (moderate): Secondary | ICD-10-CM | POA: Diagnosis not present

## 2017-02-10 DIAGNOSIS — K831 Obstruction of bile duct: Secondary | ICD-10-CM | POA: Diagnosis not present

## 2017-02-10 DIAGNOSIS — C259 Malignant neoplasm of pancreas, unspecified: Secondary | ICD-10-CM | POA: Diagnosis not present

## 2017-02-10 DIAGNOSIS — M545 Low back pain: Secondary | ICD-10-CM | POA: Diagnosis not present

## 2017-02-11 DIAGNOSIS — R634 Abnormal weight loss: Secondary | ICD-10-CM | POA: Diagnosis not present

## 2017-02-11 DIAGNOSIS — K831 Obstruction of bile duct: Secondary | ICD-10-CM | POA: Diagnosis not present

## 2017-02-11 DIAGNOSIS — D630 Anemia in neoplastic disease: Secondary | ICD-10-CM | POA: Diagnosis not present

## 2017-02-11 DIAGNOSIS — N183 Chronic kidney disease, stage 3 (moderate): Secondary | ICD-10-CM | POA: Diagnosis not present

## 2017-02-11 DIAGNOSIS — C259 Malignant neoplasm of pancreas, unspecified: Secondary | ICD-10-CM | POA: Diagnosis not present

## 2017-02-11 DIAGNOSIS — M545 Low back pain: Secondary | ICD-10-CM | POA: Diagnosis not present

## 2017-02-12 ENCOUNTER — Telehealth: Payer: Self-pay | Admitting: *Deleted

## 2017-02-12 DIAGNOSIS — N183 Chronic kidney disease, stage 3 (moderate): Secondary | ICD-10-CM | POA: Diagnosis not present

## 2017-02-12 DIAGNOSIS — C259 Malignant neoplasm of pancreas, unspecified: Secondary | ICD-10-CM | POA: Diagnosis not present

## 2017-02-12 DIAGNOSIS — R634 Abnormal weight loss: Secondary | ICD-10-CM | POA: Diagnosis not present

## 2017-02-12 DIAGNOSIS — D630 Anemia in neoplastic disease: Secondary | ICD-10-CM | POA: Diagnosis not present

## 2017-02-12 DIAGNOSIS — M545 Low back pain: Secondary | ICD-10-CM | POA: Diagnosis not present

## 2017-02-12 DIAGNOSIS — K831 Obstruction of bile duct: Secondary | ICD-10-CM | POA: Diagnosis not present

## 2017-02-12 MED ORDER — MORPHINE SULFATE (CONCENTRATE) 10 MG /0.5 ML PO SOLN: 15.0000 mg | ORAL | 0 refills | Status: AC | PRN

## 2017-02-12 MED ORDER — MORPHINE SULFATE ER 30 MG PO TBCR: 30.0000 mg | EXTENDED_RELEASE_TABLET | Freq: Two times a day (BID) | ORAL | 0 refills | Status: AC

## 2017-02-12 MED ORDER — MORPHINE SULFATE ER 30 MG PO TBCR: 30.0000 mg | EXTENDED_RELEASE_TABLET | Freq: Two times a day (BID) | ORAL | 0 refills | Status: DC

## 2017-02-12 MED ORDER — MORPHINE SULFATE (CONCENTRATE) 10 MG /0.5 ML PO SOLN: 15.0000 mg | ORAL | 0 refills | Status: DC | PRN

## 2017-02-12 NOTE — Telephone Encounter (Signed)
Received vm call from [facility] RN/Hosp[facility] stating that pt was changed over the w/e by Hospice Dr to Morphine 15 mg every 4 hr prn pain & MS Contin 15 mg bid & increased to 30 mg bid & needs refills b/c daughter is going to be away for a few days & needs to get refill before she leaves.  He was also placed on ativan 0.5 mg every 6 hours prn & haldol 2 mg every 4 hours prn.  Morphine scripts will be sent to [facility].

## 2017-02-13 DIAGNOSIS — R634 Abnormal weight loss: Secondary | ICD-10-CM | POA: Diagnosis not present

## 2017-02-13 DIAGNOSIS — C259 Malignant neoplasm of pancreas, unspecified: Secondary | ICD-10-CM | POA: Diagnosis not present

## 2017-02-13 DIAGNOSIS — D630 Anemia in neoplastic disease: Secondary | ICD-10-CM | POA: Diagnosis not present

## 2017-02-13 DIAGNOSIS — N183 Chronic kidney disease, stage 3 (moderate): Secondary | ICD-10-CM | POA: Diagnosis not present

## 2017-02-13 DIAGNOSIS — K831 Obstruction of bile duct: Secondary | ICD-10-CM | POA: Diagnosis not present

## 2017-02-13 DIAGNOSIS — M545 Low back pain: Secondary | ICD-10-CM | POA: Diagnosis not present

## 2017-02-14 DIAGNOSIS — D630 Anemia in neoplastic disease: Secondary | ICD-10-CM | POA: Diagnosis not present

## 2017-02-14 DIAGNOSIS — C259 Malignant neoplasm of pancreas, unspecified: Secondary | ICD-10-CM | POA: Diagnosis not present

## 2017-02-14 DIAGNOSIS — R634 Abnormal weight loss: Secondary | ICD-10-CM | POA: Diagnosis not present

## 2017-02-14 DIAGNOSIS — K831 Obstruction of bile duct: Secondary | ICD-10-CM | POA: Diagnosis not present

## 2017-02-14 DIAGNOSIS — N183 Chronic kidney disease, stage 3 (moderate): Secondary | ICD-10-CM | POA: Diagnosis not present

## 2017-02-14 DIAGNOSIS — M545 Low back pain: Secondary | ICD-10-CM | POA: Diagnosis not present

## 2017-02-15 DIAGNOSIS — M545 Low back pain: Secondary | ICD-10-CM | POA: Diagnosis not present

## 2017-02-15 DIAGNOSIS — N183 Chronic kidney disease, stage 3 (moderate): Secondary | ICD-10-CM | POA: Diagnosis not present

## 2017-02-15 DIAGNOSIS — K831 Obstruction of bile duct: Secondary | ICD-10-CM | POA: Diagnosis not present

## 2017-02-15 DIAGNOSIS — D630 Anemia in neoplastic disease: Secondary | ICD-10-CM | POA: Diagnosis not present

## 2017-02-15 DIAGNOSIS — R634 Abnormal weight loss: Secondary | ICD-10-CM | POA: Diagnosis not present

## 2017-02-15 DIAGNOSIS — C259 Malignant neoplasm of pancreas, unspecified: Secondary | ICD-10-CM | POA: Diagnosis not present

## 2017-02-16 DIAGNOSIS — C259 Malignant neoplasm of pancreas, unspecified: Secondary | ICD-10-CM | POA: Diagnosis not present

## 2017-02-16 DIAGNOSIS — R634 Abnormal weight loss: Secondary | ICD-10-CM | POA: Diagnosis not present

## 2017-02-16 DIAGNOSIS — M545 Low back pain: Secondary | ICD-10-CM | POA: Diagnosis not present

## 2017-02-16 DIAGNOSIS — N183 Chronic kidney disease, stage 3 (moderate): Secondary | ICD-10-CM | POA: Diagnosis not present

## 2017-02-16 DIAGNOSIS — D630 Anemia in neoplastic disease: Secondary | ICD-10-CM | POA: Diagnosis not present

## 2017-02-16 DIAGNOSIS — K831 Obstruction of bile duct: Secondary | ICD-10-CM | POA: Diagnosis not present

## 2017-02-17 ENCOUNTER — Telehealth: Payer: Self-pay | Admitting: *Deleted

## 2017-02-17 NOTE — Telephone Encounter (Signed)
Call from [place] with [place] reporting pt expired 26-Feb-2017 @ 0910.  Appt canceled. Message to Dr. Burr Medico.

## 2017-03-05 ENCOUNTER — Ambulatory Visit: Payer: Medicare Other | Admitting: Hematology

## 2017-03-07 DEATH — deceased

## 2018-09-24 IMAGING — CT CT HEAD W/O CM
3 of 4 series · 14 of 47 positions shown, 16 images · non-contrast
Comparison: CT of the head performed 02/03/2011, and MRI of the
brain performed 02/04/2011

CLINICAL DATA: Acute onset of left leg weakness. Initial encounter.

EXAM:
CT HEAD WITHOUT CONTRAST
TECHNIQUE: Contiguous axial images were obtained from the base of the skull
through the vertex without intravenous contrast.

[Series 2: head w/o · axial · non-contrast · 0.46mm/px · z∈[+1483,+1618]mm · 8 of 33 slices shown, 10 images]
[im 3/33  brain]
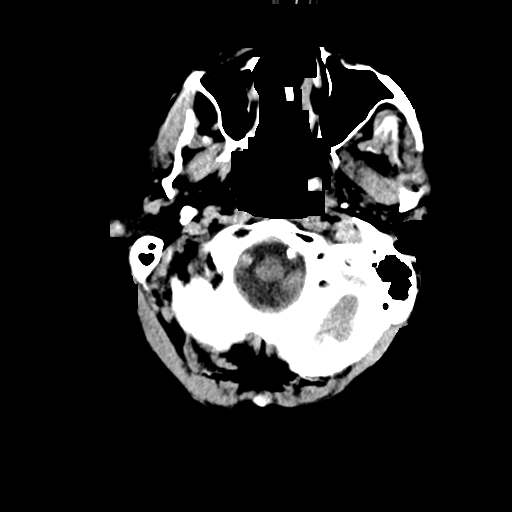
[im 3/33  bone]
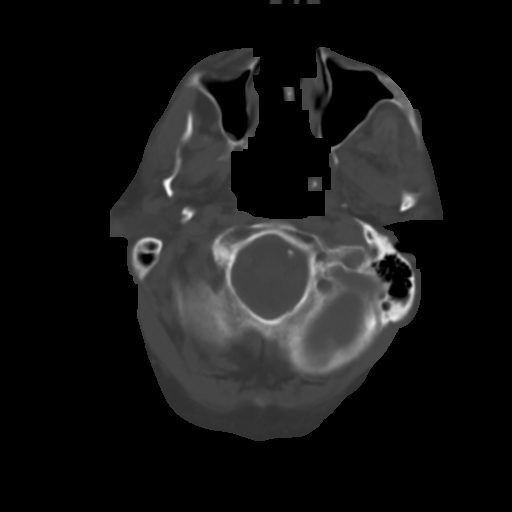
[im 7/33  brain]
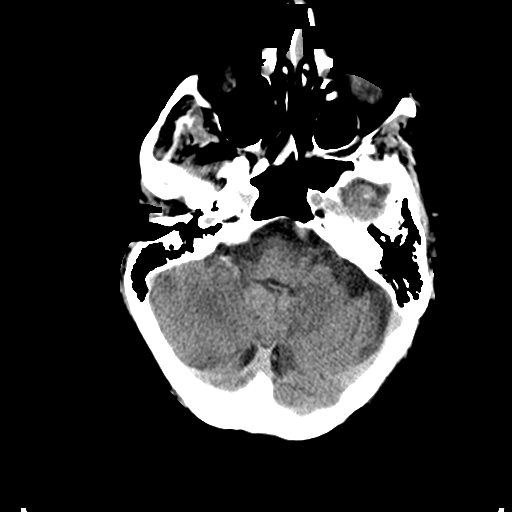
[im 12/33  brain]
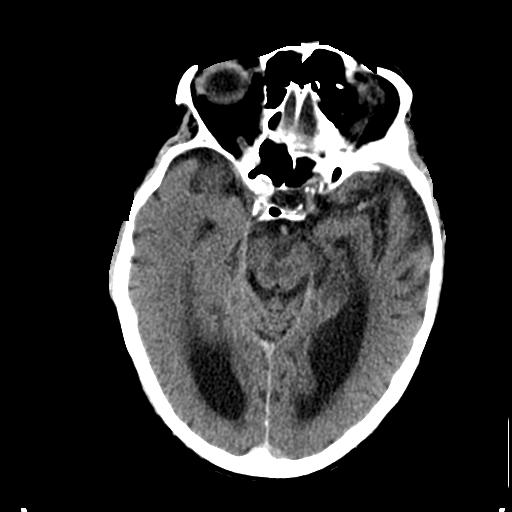
[im 14/33  brain]
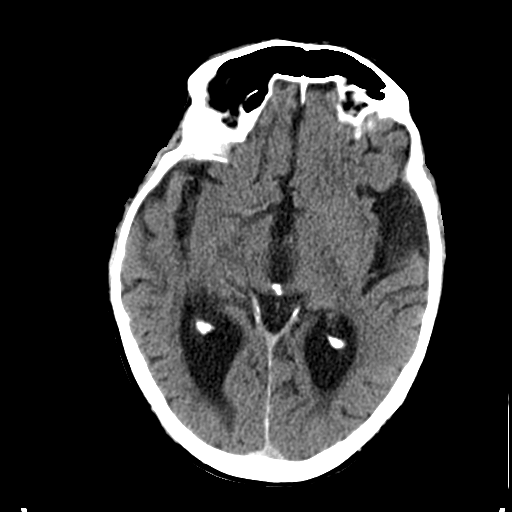
[im 19/33  brain]
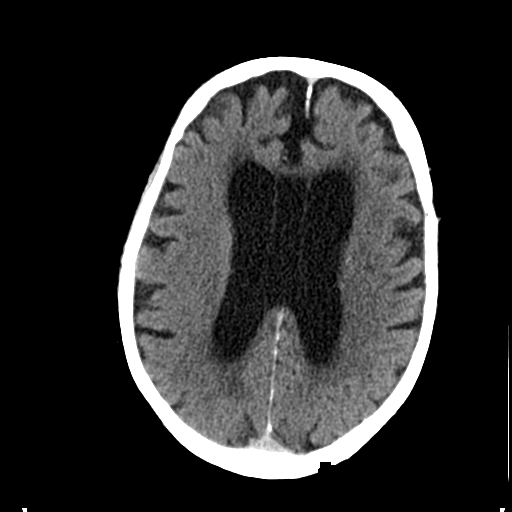
[im 19/33  bone]
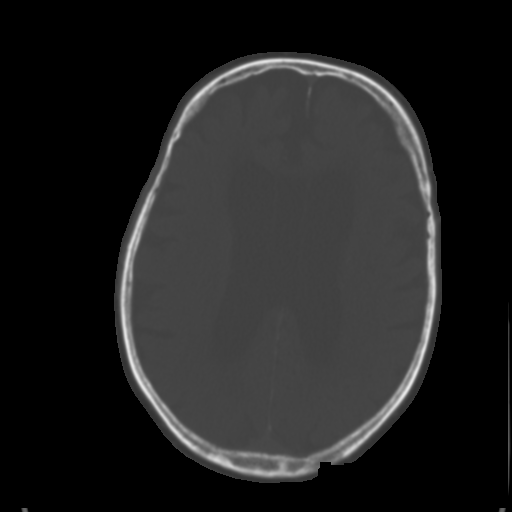
[im 21/33  brain]
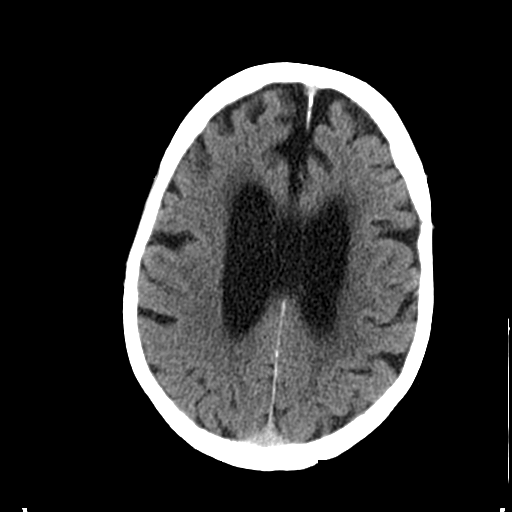
[im 26/33  brain]
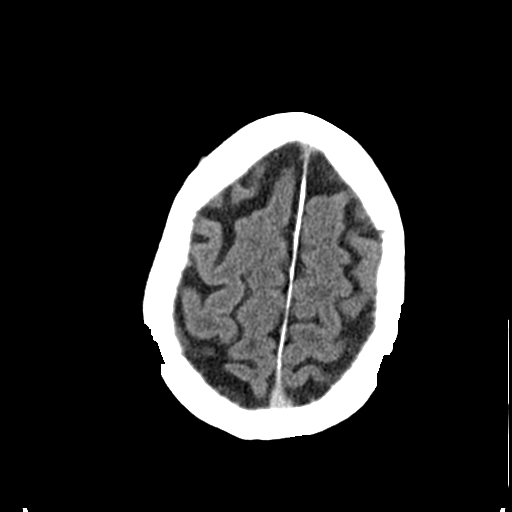
[im 30/33  brain]
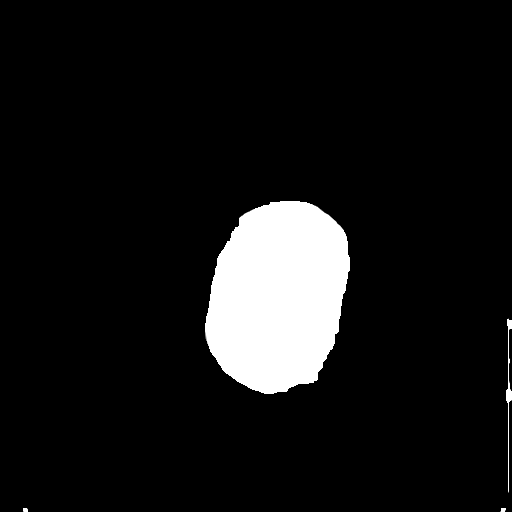

[Series 5: coronal · coronal · 0.31mm/px · 3 of 72 slices shown]
[im 24/72  brain]
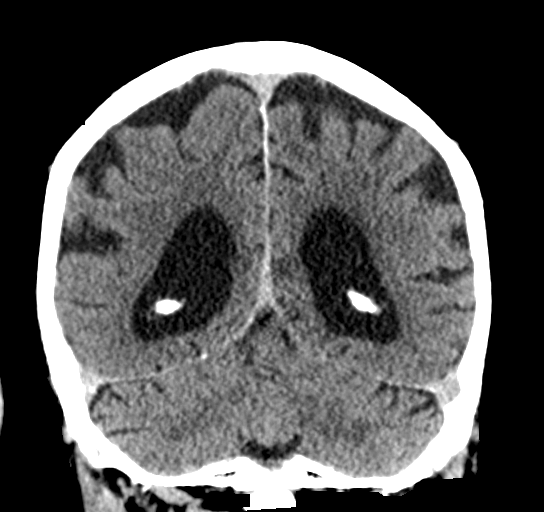
[im 32/72  brain]
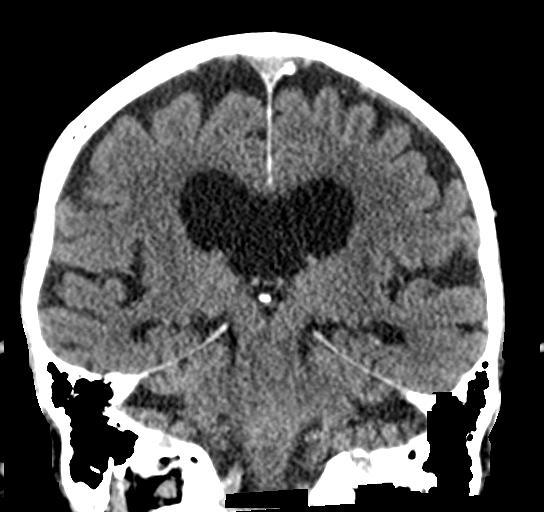
[im 40/72  brain]
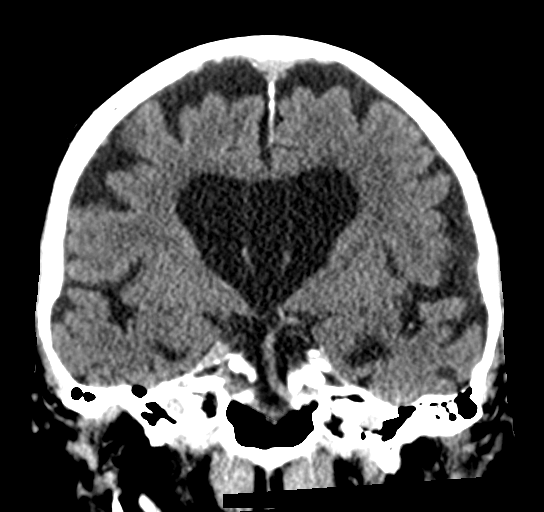

[Series 6: sagittal · sagittal · 0.29mm/px · 3 of 54 slices shown]
[im 18/54  brain]
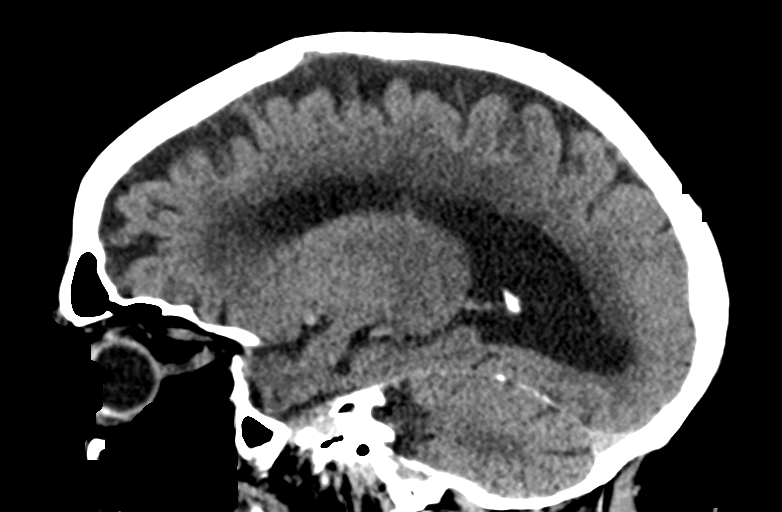
[im 27/54  brain]
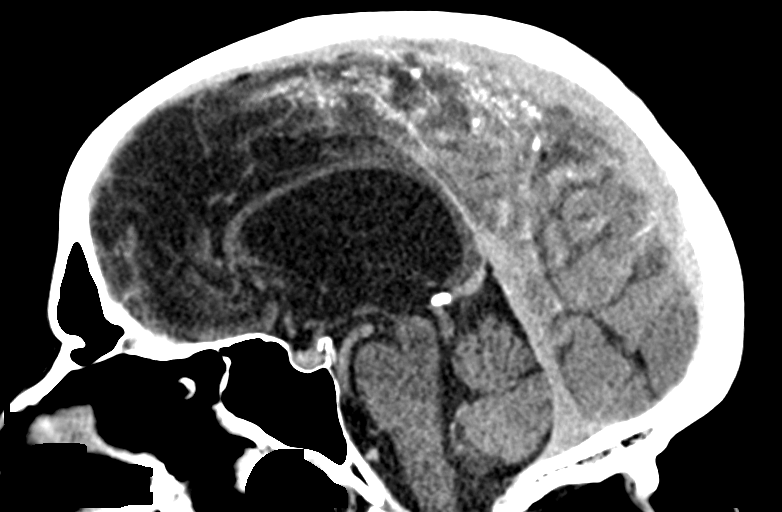
[im 36/54  brain]
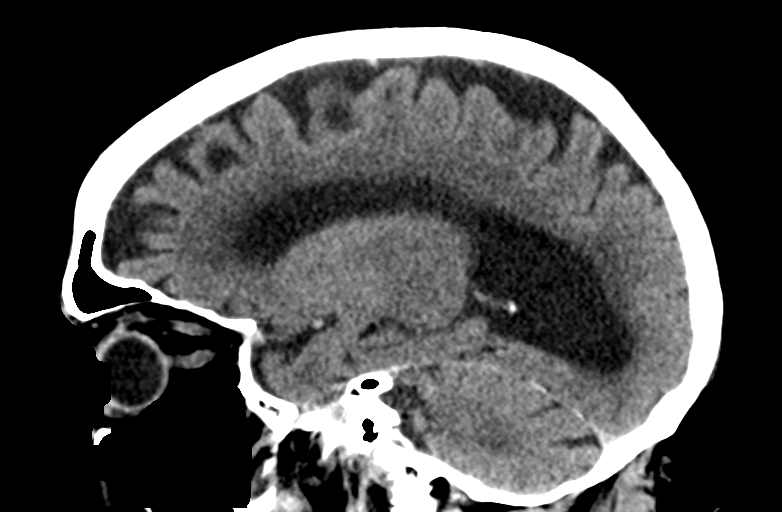

[14 of 47 positions shown; findings below may reference images not displayed]

FINDINGS: Brain: No evidence of acute infarction, hemorrhage, hydrocephalus,
extra-axial collection or mass lesion/mass effect.

Prominence of the ventricles and sulci reflects moderately severe
cortical volume loss. A large cavum septum pellucidum is noted.
Scattered periventricular white matter change likely reflects small
vessel ischemic microangiopathy. Mild cerebellar atrophy is noted.
Mild chronic ischemic change is seen at the external capsule
bilaterally.

The brainstem and fourth ventricle are within normal limits. The
cerebral hemispheres demonstrate grossly normal gray-white
differentiation. No mass effect or midline shift is seen.

Vascular: No hyperdense vessel or unexpected calcification.

Skull: There is no evidence of fracture; visualized osseous
structures are unremarkable in appearance.

Sinuses/Orbits: The orbits are within normal limits. The paranasal
sinuses and mastoid air cells are well-aerated.

Other: No significant soft tissue abnormalities are seen.
IMPRESSION: 1. No acute intracranial pathology seen on CT.
2. Moderately severe cortical volume loss and scattered small vessel
ischemic microangiopathy.
3. Mild chronic ischemic change at the external capsule bilaterally.
